# Patient Record
Sex: Female | Born: 1950
Health system: Southern US, Community
[De-identification: ages and names within clinical notes are randomized; demographics above are authoritative.]

## PROBLEM LIST (undated history)

## (undated) DIAGNOSIS — E079 Disorder of thyroid, unspecified: Secondary | ICD-10-CM

## (undated) DIAGNOSIS — K219 Gastro-esophageal reflux disease without esophagitis: Secondary | ICD-10-CM

## (undated) DIAGNOSIS — U071 COVID-19: Secondary | ICD-10-CM

## (undated) DIAGNOSIS — E039 Hypothyroidism, unspecified: Secondary | ICD-10-CM

## (undated) DIAGNOSIS — K5792 Diverticulitis of intestine, part unspecified, without perforation or abscess without bleeding: Secondary | ICD-10-CM

## (undated) HISTORY — DX: Gastro-esophageal reflux disease without esophagitis: K21.9

## (undated) HISTORY — PX: COLONOSCOPY W/ POLYPECTOMY: SHX1380

## (undated) HISTORY — PX: ABDOMINAL HYSTERECTOMY: SHX81

## (undated) HISTORY — DX: Disorder of thyroid, unspecified: E07.9

## (undated) HISTORY — DX: Diverticulitis of intestine, part unspecified, without perforation or abscess without bleeding: K57.92

---

## 2004-04-01 ENCOUNTER — Ambulatory Visit (HOSPITAL_COMMUNITY): Admission: RE | Admit: 2004-04-01 | Discharge: 2004-04-01 | Payer: Self-pay | Admitting: Gastroenterology

## 2004-04-01 ENCOUNTER — Encounter (INDEPENDENT_AMBULATORY_CARE_PROVIDER_SITE_OTHER): Payer: Self-pay | Admitting: Specialist

## 2006-01-26 ENCOUNTER — Other Ambulatory Visit: Admission: RE | Admit: 2006-01-26 | Discharge: 2006-01-26 | Payer: Self-pay | Admitting: Family Medicine

## 2010-07-07 ENCOUNTER — Inpatient Hospital Stay (HOSPITAL_COMMUNITY): Admission: EM | Admit: 2010-07-07 | Discharge: 2010-07-12 | Payer: Self-pay | Source: Home / Self Care

## 2010-07-23 ENCOUNTER — Ambulatory Visit (HOSPITAL_COMMUNITY)
Admission: RE | Admit: 2010-07-23 | Discharge: 2010-07-23 | Payer: Self-pay | Source: Home / Self Care | Attending: Surgery | Admitting: Surgery

## 2010-10-13 LAB — CBC
HCT: 38.4 % (ref 36.0–46.0)
HCT: 40.8 % (ref 36.0–46.0)
Hemoglobin: 13 g/dL (ref 12.0–15.0)
Hemoglobin: 14 g/dL (ref 12.0–15.0)
MCH: 31.5 pg (ref 26.0–34.0)
MCHC: 33.9 g/dL (ref 30.0–36.0)
MCHC: 34.3 g/dL (ref 30.0–36.0)
MCV: 91.3 fL (ref 78.0–100.0)
Platelets: 142 10*3/uL — ABNORMAL LOW (ref 150–400)
RDW: 12 % (ref 11.5–15.5)

## 2010-10-13 LAB — DIFFERENTIAL
Basophils Absolute: 0 10*3/uL (ref 0.0–0.1)
Lymphocytes Relative: 23 % (ref 12–46)
Lymphocytes Relative: 33 % (ref 12–46)
Lymphs Abs: 1.3 10*3/uL (ref 0.7–4.0)
Lymphs Abs: 2.5 10*3/uL (ref 0.7–4.0)
Monocytes Absolute: 0.3 10*3/uL (ref 0.1–1.0)
Monocytes Absolute: 0.8 10*3/uL (ref 0.1–1.0)
Monocytes Relative: 7 % (ref 3–12)
Monocytes Relative: 9 % (ref 3–12)
Neutro Abs: 2.1 10*3/uL (ref 1.7–7.7)
Neutro Abs: 7.5 10*3/uL (ref 1.7–7.7)

## 2010-10-13 LAB — BASIC METABOLIC PANEL
CO2: 26 mEq/L (ref 19–32)
GFR calc non Af Amer: >60 mL/min (ref >60)
Glucose, Bld: 106 mg/dL — ABNORMAL HIGH (ref 70–99)
Potassium: 4.1 mEq/L (ref 3.5–5.1)
Sodium: 139 mEq/L (ref 135–145)

## 2010-10-13 LAB — COMPREHENSIVE METABOLIC PANEL
Albumin: 3.3 g/dL — ABNORMAL LOW (ref 3.5–5.2)
BUN: 6 mg/dL (ref 6–23)
Calcium: 8.8 mg/dL (ref 8.4–10.5)
Creatinine, Ser: 0.7 mg/dL (ref 0.4–1.2)
Total Protein: 6.5 g/dL (ref 6.0–8.3)

## 2010-10-13 LAB — URINALYSIS, ROUTINE W REFLEX MICROSCOPIC
Hgb urine dipstick: NEGATIVE
Nitrite: NEGATIVE
Protein, ur: NEGATIVE mg/dL
Urobilinogen, UA: 1 mg/dL (ref 0.0–1.0)

## 2010-10-13 LAB — PHOSPHORUS: Phosphorus: 2.8 mg/dL (ref 2.3–4.6)
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
## 2010-12-18 NOTE — Op Note (Signed)
NAME:  VERBA, AINLEY                       ACCOUNT NO.:  [id number]   MEDICAL RECORD NO.:  [id number]                   PATIENT TYPE:  AMB   LOCATION:  ENDO                                 FACILITY:  [facility]   PHYSICIAN:  Danise Edge, M.D.                DATE OF BIRTH:  [birth year]   DATE OF PROCEDURE:  04/01/2004  DATE OF DISCHARGE:                                 OPERATIVE REPORT   PROCEDURE:  Screening colonoscopy.   PROCEDURE INDICATION:  Ms. Kaylina Cahue is a 60 year old female, born  1951/07/20.  Ms. Patchell is scheduled to undergo her first screening  colonoscopy with polypectomy to prevent colon cancer.  Her mother required  surgery to remove colon polyps.  She died postoperatively of disseminated  intravascular coagulation.   ENDOSCOPIST:  Danise Edge, M.D.   PREMEDICATION:  1.  Versed 6 mg.  2.  Demerol 50 mg.   DESCRIPTION OF PROCEDURE:  After obtaining informed consent, Ms. Welcher was  placed in the left lateral decubitus position.  I administered intravenous  Demerol and intravenous Versed to achieve conscious sedation for the  procedure.  The patient's blood pressure, oxygen saturation, and cardiac  rhythm were monitored throughout the procedure and documented in the medical  record.   Anal inspection and digital rectal exam were normal.  The Olympus adjustable  pediatric colonoscope was introduced into the rectum and advanced to the  cecum.  Colonic preparation for the exam today was excellent.   RECTUM:  Normal.  SIGMOID COLON AND DESCENDING COLON:  At 20 cm from the anal verge, a 2 mm  sessile polyp was removed with the electrocautery snare.  There are a few  shallow diverticula noted in the left colon.  SPLENIC FLEXURE:  Normal.  TRANSVERSE COLON:  Normal.  HEPATIC FLEXURE:  Normal.  ASCENDING COLON:  Normal.  CECUM AND ILEOCECAL VALVE:  Normal.   ASSESSMENT:  1.  A small polyp was removed from the distal sigmoid colon.  2.  Shallow  diverticula are present in the left colon.                                               Danise Edge, M.D.    MJ/MEDQ  D:  04/01/2004  T:  04/01/2004  Job:  [id number]   cc:   Donia Guiles, M.D.  [address] [place]  [place] [address]  Fax: [phone]

## 2012-02-14 ENCOUNTER — Ambulatory Visit
Admission: RE | Admit: 2012-02-14 | Discharge: 2012-02-14 | Disposition: A | Discharge status: Home / Self Care | Payer: Managed Care, Other (non HMO) | Source: Ambulatory Visit | Attending: Family Medicine | Admitting: Family Medicine

## 2012-02-14 ENCOUNTER — Other Ambulatory Visit: Payer: Self-pay | Admitting: Family Medicine

## 2012-02-14 DIAGNOSIS — R52 Pain, unspecified: Secondary | ICD-10-CM

## 2012-02-29 ENCOUNTER — Other Ambulatory Visit: Payer: Self-pay | Admitting: Family Medicine

## 2012-02-29 ENCOUNTER — Ambulatory Visit
Admission: RE | Admit: 2012-02-29 | Discharge: 2012-02-29 | Disposition: A | Discharge status: Home / Self Care | Payer: Managed Care, Other (non HMO) | Source: Ambulatory Visit | Attending: Family Medicine | Admitting: Family Medicine

## 2012-02-29 DIAGNOSIS — M7989 Other specified soft tissue disorders: Secondary | ICD-10-CM

## 2012-02-29 MED ORDER — GADOBENATE DIMEGLUMINE 529 MG/ML IV SOLN
7.0000 mL | Freq: Once | INTRAVENOUS | Status: AC | PRN
  Administered 2012-02-29: 7 mL via INTRAVENOUS

## 2013-05-02 ENCOUNTER — Ambulatory Visit
Admission: RE | Admit: 2013-05-02 | Discharge: 2013-05-02 | Disposition: A | Discharge status: Home / Self Care | Payer: 59 | Source: Ambulatory Visit | Attending: Family Medicine | Admitting: Family Medicine

## 2013-05-02 ENCOUNTER — Other Ambulatory Visit: Payer: Self-pay | Admitting: Family Medicine

## 2013-05-02 DIAGNOSIS — R109 Unspecified abdominal pain: Secondary | ICD-10-CM

## 2013-05-02 IMAGING — CT CT ABD-PELV W/ CM
2 of 5 series · 17 of 46 positions shown, 19 images · IV contrast (30CC OMNI 300 & [ID] OMNI 300)
Comparison: [DATE]

CLINICAL DATA: Lower abdominal pain for 4 days, nausea, initial
encounter, past history of bowel perforation

EXAM:
CT ABDOMEN AND PELVIS WITH CONTRAST
TECHNIQUE: Multidetector CT imaging of the abdomen and pelvis was performed
using the standard protocol following bolus administration of
intravenous contrast. Sagittal and coronal MPR images reconstructed
from axial data set.
BUN and creatinine were obtained on site at [HOSPITAL] at
[HOSPITAL].
Results:  BUN 10 mg/dL,  Creatinine 0.6 mg/dL.
CONTRAST:  100mL OMNIPAQUE IOHEXOL 300 MG/ML SOLN Dilute oral
contrast.

[Series 2: abd/pelvis with · axial · 0.82mm/px · z∈[-444,-50]mm · 14 of 89 slices shown, 16 images]
[im 5/89  soft-tissue]
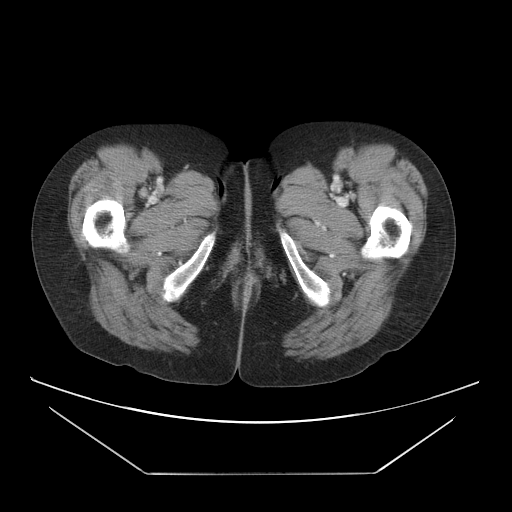
[im 5/89  bone]
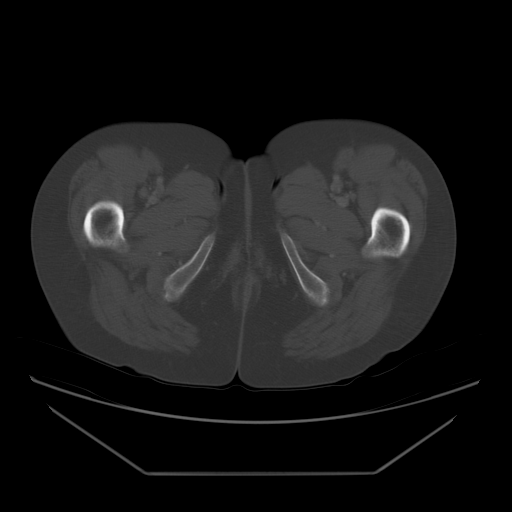
[im 10/89  soft-tissue]
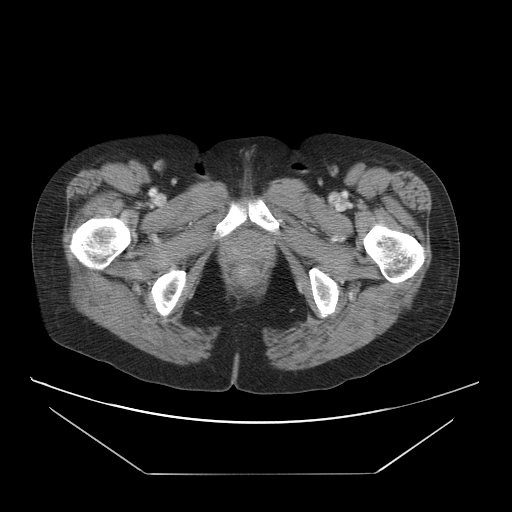
[im 19/89  soft-tissue]
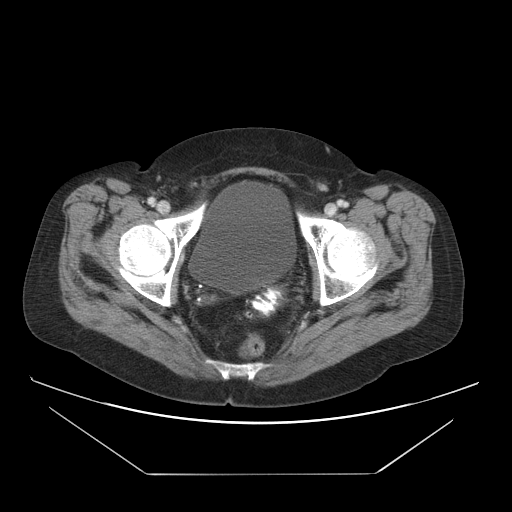
[im 24/89  soft-tissue]
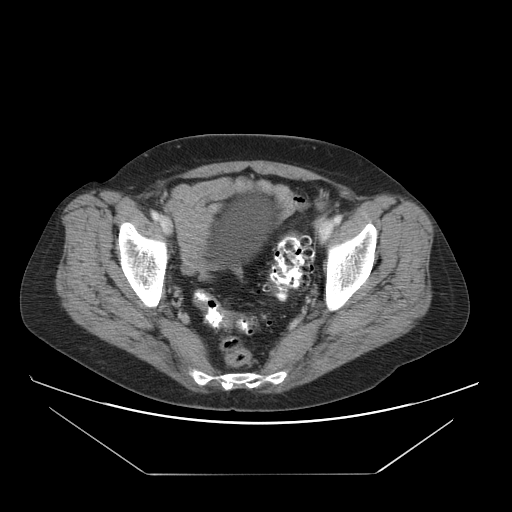
[im 28/89  soft-tissue]
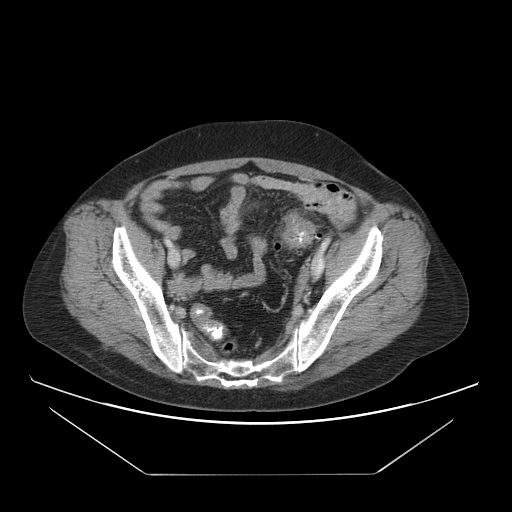
[im 38/89  soft-tissue]
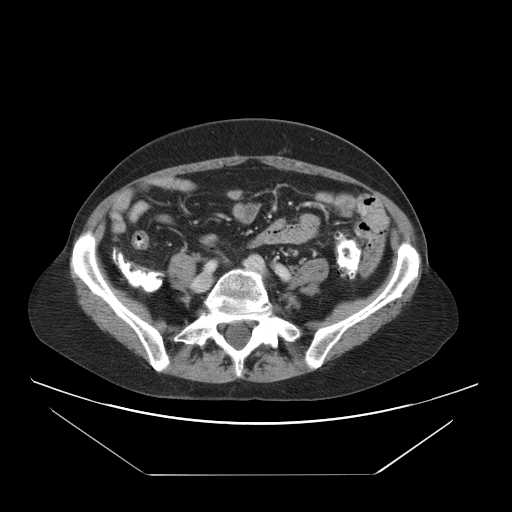
[im 42/89  soft-tissue]
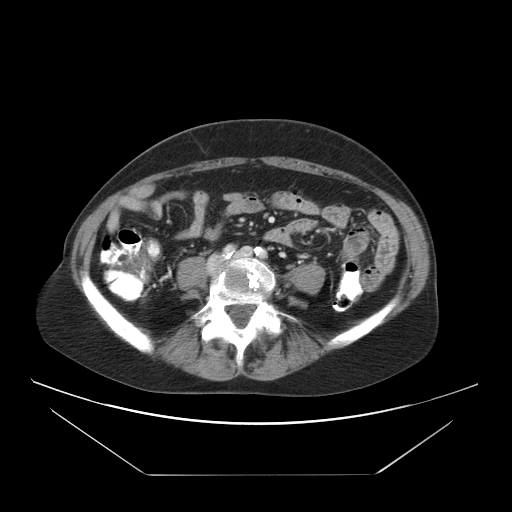
[im 47/89  soft-tissue]
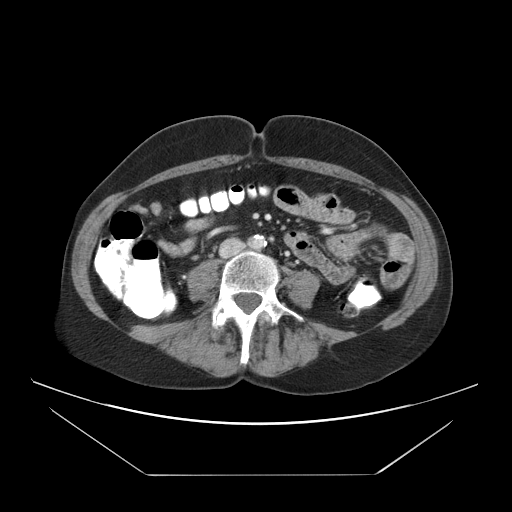
[im 51/89  soft-tissue]
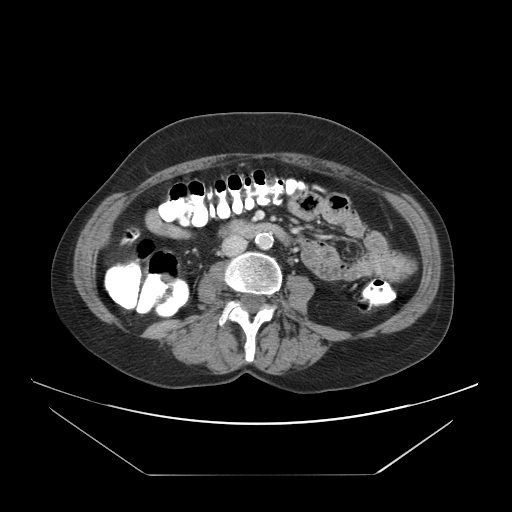
[im 51/89  bone]
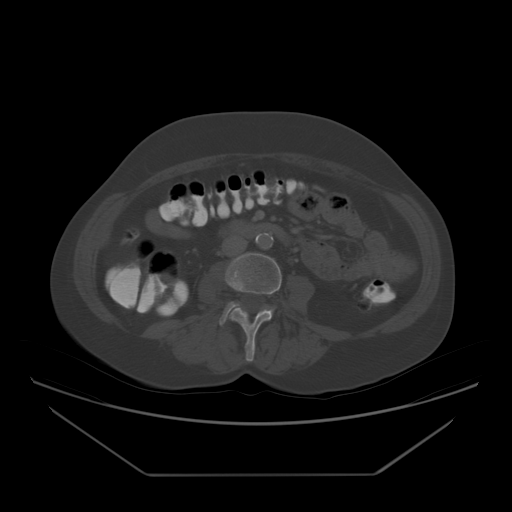
[im 61/89  soft-tissue]
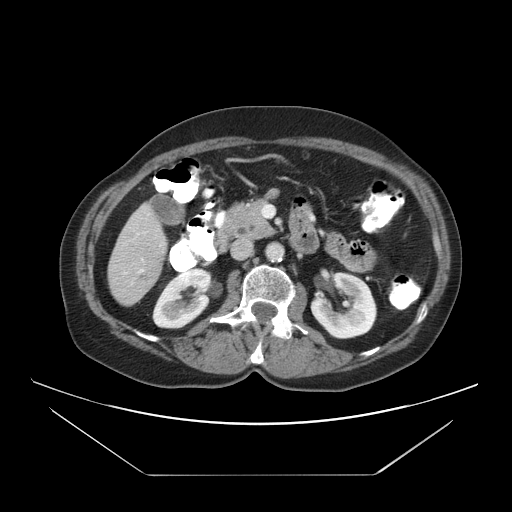
[im 65/89  soft-tissue]
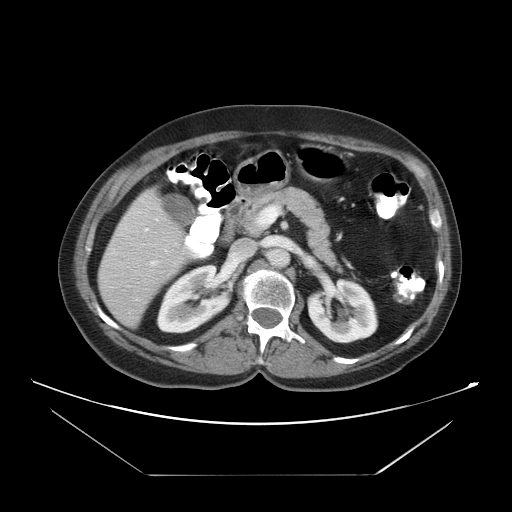
[im 70/89  soft-tissue]
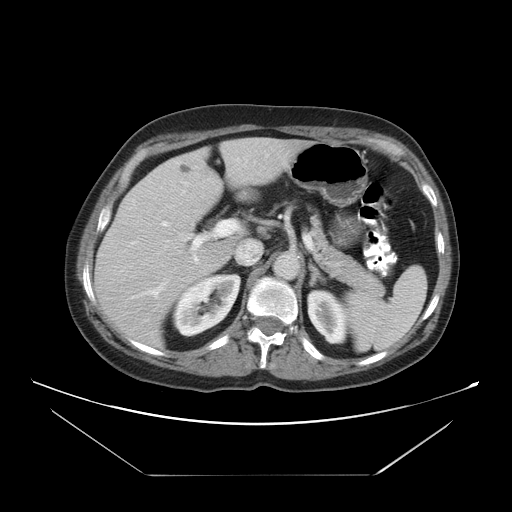
[im 79/89  soft-tissue]
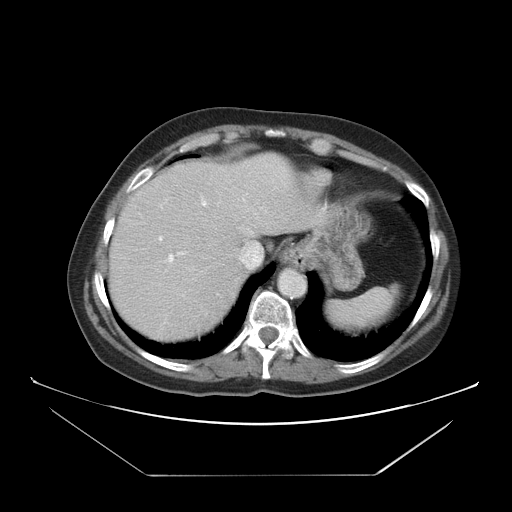
[im 84/89  soft-tissue]
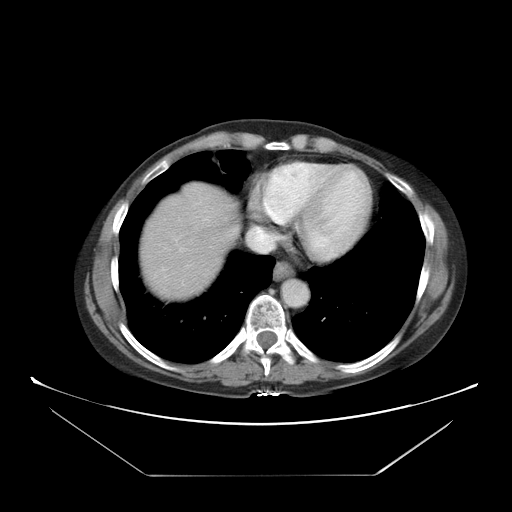

[Series 401: coronal · coronal · 0.88mm/px · 3 of 105 slices shown]
[im 35/105  soft-tissue]
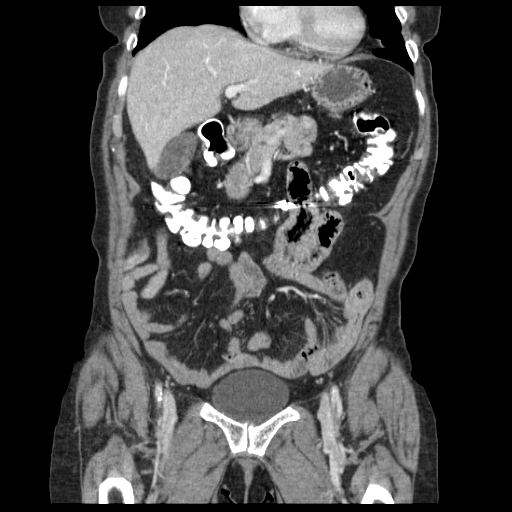
[im 47/105  soft-tissue]
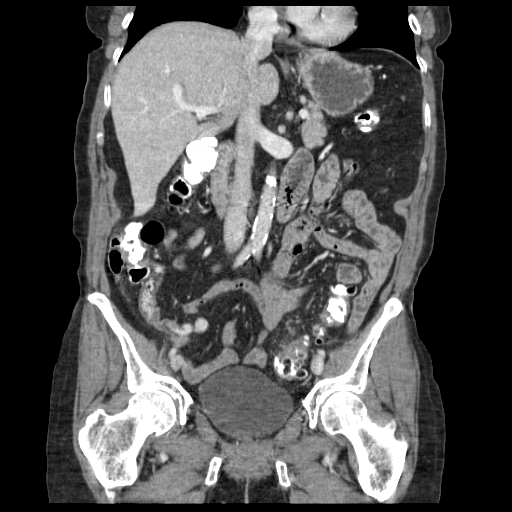
[im 58/105  soft-tissue]
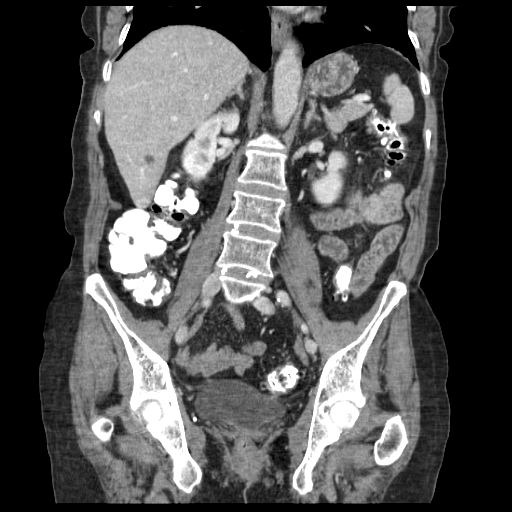

[17 of 46 positions shown; findings below may reference images not displayed]

FINDINGS: Minimal dependent atelectasis at lung bases.

Small hepatic cysts.

Liver, spleen, pancreas, kidneys, and adrenal glands normal
appearance.

Again noted along the diverticulum at pancreatic head.

Small umbilical hernia containing fat.

Normal appendix.

Diverticulosis of the descending and sigmoid colon with focal wall
thickening and pericolic inflammation at the proximal sigmoid colon
compatible with acute diverticulitis.

No evidence of abscess or perforation.

Beam hardening artifacts secondary to a 6 mm diameter metallic
foreign body in the mid abdomen.

Bladder and ureters unremarkable.

Scattered atherosclerotic calcifications.

Uterus surgically absent with unremarkable ovaries.

No mass, adenopathy, free fluid or free air.

No hernia or acute bone lesion.
IMPRESSION: Acute sigmoid diverticulitis without evidence of perforation or
abscess.

Small umbilical hernia containing fat.

Nonspecific metallic foreign body in mid abdomen 6 mm diameter.

Small hepatic cysts.

## 2013-05-02 MED ORDER — IOHEXOL 300 MG/ML  SOLN
100.0000 mL | Freq: Once | INTRAMUSCULAR | Status: AC | PRN
  Administered 2013-05-02: 100 mL via INTRAVENOUS

## 2013-06-19 ENCOUNTER — Other Ambulatory Visit: Payer: Self-pay | Admitting: Family Medicine

## 2013-06-19 DIAGNOSIS — K5792 Diverticulitis of intestine, part unspecified, without perforation or abscess without bleeding: Secondary | ICD-10-CM

## 2013-06-19 DIAGNOSIS — R109 Unspecified abdominal pain: Secondary | ICD-10-CM

## 2013-06-20 ENCOUNTER — Ambulatory Visit
Admission: RE | Admit: 2013-06-20 | Discharge: 2013-06-20 | Disposition: A | Discharge status: Home / Self Care | Payer: 59 | Source: Ambulatory Visit | Attending: Family Medicine | Admitting: Family Medicine

## 2013-06-20 DIAGNOSIS — R109 Unspecified abdominal pain: Secondary | ICD-10-CM

## 2013-06-20 DIAGNOSIS — K5792 Diverticulitis of intestine, part unspecified, without perforation or abscess without bleeding: Secondary | ICD-10-CM

## 2013-06-20 MED ORDER — IOHEXOL 300 MG/ML  SOLN
100.0000 mL | Freq: Once | INTRAMUSCULAR | Status: AC | PRN
  Administered 2013-06-20: 100 mL via INTRAVENOUS

## 2013-11-15 ENCOUNTER — Ambulatory Visit (INDEPENDENT_AMBULATORY_CARE_PROVIDER_SITE_OTHER): Payer: 59

## 2013-11-15 ENCOUNTER — Other Ambulatory Visit: Payer: Self-pay

## 2013-11-15 VITALS — BP 114/84 | HR 92 | Resp 18

## 2013-11-15 DIAGNOSIS — M67451 Ganglion, right hip: Secondary | ICD-10-CM

## 2013-11-15 DIAGNOSIS — S9030XA Contusion of unspecified foot, initial encounter: Secondary | ICD-10-CM

## 2013-11-15 DIAGNOSIS — M6748 Ganglion, other site: Secondary | ICD-10-CM

## 2013-11-15 DIAGNOSIS — R52 Pain, unspecified: Secondary | ICD-10-CM

## 2013-11-15 DIAGNOSIS — M6740 Ganglion, unspecified site: Secondary | ICD-10-CM

## 2013-11-15 DIAGNOSIS — M7660 Achilles tendinitis, unspecified leg: Secondary | ICD-10-CM

## 2013-11-15 NOTE — Patient Instructions (Signed)
ICE INSTRUCTIONS  Apply ice or cold pack to the affected area at least 3 times a day for 10-15 minutes each time.  You should also use ice after prolonged activity or vigorous exercise.  Do not apply ice longer than 20 minutes at one time.  Always keep a cloth between your skin and the ice pack to prevent burns.  Being consistent and following these instructions will help control your symptoms.  We suggest you purchase a gel ice pack because they are reusable and do bit leak.  Some of them are designed to wrap around the area.  Use the method that works best for you.  Here are some other suggestions for icing.   Use a frozen bag of peas or corn-inexpensive and molds well to your body, usually stays frozen for 10 to 20 minutes.  Wet a towel with cold water and squeeze out the excess until it's damp.  Place in a bag in the freezer for 20 minutes. Then remove and use.  Maintain ice pack 3 or 4 times a day also maintain compression of the foot at all times for the next 2 weeks

## 2013-11-15 NOTE — Progress Notes (Signed)
   Subjective:    Patient ID: Julie Arias, female    DOB: 1950/10/02, 63 y.o.   MRN: [id number]  HPI my right foot has been bothering me since Saturday 11/10/13 and throbbing and swollen and hurts to walk on it and I was planting flowers and I was bend down and then when I came back up and I heard a pop and hurts with shoes and a callus on my 5th left little toe and tried callus remover and tried the pads as well    Review of Systems  Constitutional: Positive for fatigue.  All other systems reviewed and are negative.      Objective:   Physical Exam Neurovascular status is intact with pedal pulses palpable DP postal for PT posterior were for capillary fill time 3 seconds all digits skin temperature warm turgor normal no edema rubor pallor or varicosities noted on left right foot is edema over the mid tarsus second third metatarsal area of the right foot. There is history of injury patient heard a popping sensation when she was kneeling down and got up several days ago since that time there is a large swollen painful area over the dorsal foot distal to the Lisfranc joint. Neurologically epicritic and proprioceptive sensations intact there is pain on palpation of percussion over this area x-rays demonstrate rectus foot type no signs of obvious fracture or osseous abnormality or identified mild inferior calcaneal spurring is noted. Neurologically skin color pigment normal hair growth absent nails criptotic no ecchymosis no laceration or noted no secondary infection noted patient also some pinch callus in the fifth digit due to unrelenting fifth toe of left foot may address this at some time in the future would be candidate for tube foam padding cushioning I silicone Schillinger padding or possibly the hammertoe repair at some point in the future.       Assessment & Plan:  Assessment this time suspect contusion with secondary ganglion cyst or the dorsum of the foot it is in a coming from this  subtalar and mid tarsus joint/Lisfranc joint or the extensor tendon synovium. At this time local anesthetic block administered total of 3 cc 50-50 mixture of 2% Xylocaine plain and 0.5% Marcaine plain to the dorsum of the right foot Betadine prep performed at this time a 20-gauge needle is introduced and approximately 2 cc of a gelatinous bloody drainage is identified expressed from the site to CC samples submitted for pathology analysis in the heparin container Vacutainer. Patient will followup with in 2 weeks for reevaluation after the aspiration site was then infiltrated with 0.5 cc or of Kenalog 10 and 0.5 cc of Marcaine plain. A compression dressing was applied and ankle dispensed to maintain compression for the next several weeks. Reappointed 2 weeks for followup and reevaluation patient is tramadol for pain released plain Tylenol if needed  Harriet Masson DPM

## 2013-11-19 ENCOUNTER — Telehealth: Payer: Self-pay | Admitting: *Deleted

## 2013-11-19 LAB — SYNOVIAL FLUID PANEL: CRYSTALS FLUID: NONE SEEN

## 2013-11-19 NOTE — Telephone Encounter (Signed)
We were not able to do synovial fluid test on the sample because it was too thick and cloudy.  We were able to do the culture.  I told her I would let Dr. Blenda Mounts know.

## 2013-11-20 ENCOUNTER — Ambulatory Visit: Payer: Self-pay | Admitting: Podiatrist

## 2013-11-21 LAB — BODY FLUID CULTURE
GRAM STAIN: NONE SEEN
GRAM STAIN: NONE SEEN
Organism ID, Bacteria: NO GROWTH

## 2013-11-28 ENCOUNTER — Ambulatory Visit (INDEPENDENT_AMBULATORY_CARE_PROVIDER_SITE_OTHER): Payer: 59

## 2013-11-28 VITALS — BP 78/48 | HR 70 | Resp 18

## 2013-11-28 DIAGNOSIS — S9030XA Contusion of unspecified foot, initial encounter: Secondary | ICD-10-CM

## 2013-11-28 DIAGNOSIS — M6740 Ganglion, unspecified site: Secondary | ICD-10-CM

## 2013-11-28 NOTE — Patient Instructions (Signed)
ICE INSTRUCTIONS  Apply ice or cold pack to the affected area at least 3 times a day for 10-15 minutes each time.  You should also use ice after prolonged activity or vigorous exercise.  Do not apply ice longer than 20 minutes at one time.  Always keep a cloth between your skin and the ice pack to prevent burns.  Being consistent and following these instructions will help control your symptoms.  We suggest you purchase a gel ice pack because they are reusable and do bit leak.  Some of them are designed to wrap around the area.  Use the method that works best for you.  Here are some other suggestions for icing.   Use a frozen bag of peas or corn-inexpensive and molds well to your body, usually stays frozen for 10 to 20 minutes.  Wet a towel with cold water and squeeze out the excess until it's damp.  Place in a bag in the freezer for 20 minutes. Then remove and use.  Also maintain compression wrap at all times especially during the day utilizing the anklet or Coflex wrap  If not resolved in one month or but anytime he gets bigger more painful or exacerbates followup as needed

## 2013-11-28 NOTE — Progress Notes (Signed)
   Subjective:    Patient ID: Julie Arias, female    DOB: January 20, 1951, 63 y.o.   MRN: [id number]  HPI I am doing much better on top of my right foot    Review of Systems no systemic changes or findings noted     Objective:   Physical Exam Neurovascular status is intact pedal pulses are palpable no cyst of the dorsal second metatarsal area of the right foot is going down patient wearing compression stockings for the most part at this time some Coflex wrap was applied she is washing stocking currently. Pathology report to indicate no sign of infection no crystals this is most likely consistent with ganglion cyst and will monitor at this point if resolving over no recurrence no additional treatment needed however if there's additional swelling or recurrence of the next month or any time followup in the future as needed       Assessment & Plan:  Assessment good progress following aspiration of suspected ganglion cyst dorsum right foot minimal edema still present mild ecchymosis of this doesn't following the aspiration and local block patient is maintain compression wrap as instructed utilizing anklet or Coflex wrap in followup as needed if no improvement within a month  Harriet Masson DPM

## 2014-04-24 ENCOUNTER — Ambulatory Visit (INDEPENDENT_AMBULATORY_CARE_PROVIDER_SITE_OTHER): Payer: 59

## 2014-04-24 VITALS — BP 141/87 | HR 81 | Resp 18

## 2014-04-24 DIAGNOSIS — M6740 Ganglion, unspecified site: Secondary | ICD-10-CM

## 2014-04-24 DIAGNOSIS — R52 Pain, unspecified: Secondary | ICD-10-CM

## 2014-04-24 NOTE — Patient Instructions (Signed)
Pre-Operative Instructions  Congratulations, you have decided to take an important step to improving your quality of life.  You can be assured that the doctors of [facility] will be with you every step of the way.  1. Plan to be at the surgery center/hospital at least 1 (one) hour prior to your scheduled time unless otherwise directed by the surgical center/hospital staff.  You must have a responsible adult accompany you, remain during the surgery and drive you home.  Make sure you have directions to the surgical center/hospital and know how to get there on time. 2. For hospital based surgery you will need to obtain a history and physical form from your family physician within 1 month prior to the date of surgery- we will give you a form for you primary physician.  3. We make every effort to accommodate the date you request for surgery.  There are however, times where surgery dates or times have to be moved.  We will contact you as soon as possible if a change in schedule is required.   4. No Aspirin/Ibuprofen for one week before surgery.  If you are on aspirin, any non-steroidal anti-inflammatory medications (Mobic, Aleve, Ibuprofen) you should stop taking it 7 days prior to your surgery.  You make take Tylenol  For pain prior to surgery.  5. Medications- If you are taking daily heart and blood pressure medications, seizure, reflux, allergy, asthma, anxiety, pain or diabetes medications, make sure the surgery center/hospital is aware before the day of surgery so they may notify you which medications to take or avoid the day of surgery. 6. No food or drink after midnight the night before surgery unless directed otherwise by surgical center/hospital staff. 7. No alcoholic beverages 24 hours prior to surgery.  No smoking 24 hours prior to or 24 hours after surgery. 8. Wear loose pants or shorts- loose enough to fit over bandages, boots, and casts. 9. No slip on shoes, sneakers are best. 10. Bring  your boot with you to the surgery center/hospital.  Also bring crutches or a walker if your physician has prescribed it for you.  If you do not have this equipment, it will be provided for you after surgery. 11. If you have not been contracted by the surgery center/hospital by the day before your surgery, call to confirm the date and time of your surgery. 12. Leave-time from work may vary depending on the type of surgery you have.  Appropriate arrangements should be made prior to surgery with your employer. 13. Prescriptions will be provided immediately following surgery by your doctor.  Have these filled as soon as possible after surgery and take the medication as directed. 14. Remove nail polish on the operative foot. 15. Wash the night before surgery.  The night before surgery wash the foot and leg well with the antibacterial soap provided and water paying special attention to beneath the toenails and in between the toes.  Rinse thoroughly with water and dry well with a towel.  Perform this wash unless told not to do so by your physician.  Enclosed: 1 Ice pack (please put in freezer the night before surgery)   1 Hibiclens skin cleaner   Pre-op Instructions  If you have any questions regarding the instructions, do not hesitate to call our office.  [place]: [address] [place], [place] [address] [phone]  [place]: [address] [place], [place] [address] [phone]  [place]: [address]  [place], [place] [address] [phone]  Dr. Earnestene Angello   Tuchman DPM, Dr. Norman Regal DPM Dr. Mickey Hebel DPM, Dr. M. Todd Hyatt DPM, Dr. Kathryn Egerton DPM 

## 2014-04-24 NOTE — Progress Notes (Signed)
   Subjective:    Patient ID: Julie Arias, female    DOB: [birth year], 63 y.o.   MRN: [id number]  HPI we were in [place] on vacation and i noticed on Wednesday that this place has came up and started out as a little knot and now it has gotten bigger and red and feels like there is something in it on my right foot    Review of Systems no new findings or systemic changes noted since last visit April     Objective:   Physical Exam 63 year old female well-developed well-nourished or and chemistry presents this time with recurrence of soft tissue Mass lesion dorsum right foot over the second medullary a. It is been gone ahead and aspirated back in April of this year have a small lump that was not pain for symptomatic however it has now enlarged in the last couple of weeks painful tender area greater than inch in diameter from medial to lateral in the proximal distal. Sore line the second metatarsal shaft is somewhat movable early firm although not hard as bone. There is tenderness on palpation the area draped lateral compression no increased temperature no ascending psoas lymphangitis no open wounds or ulcers no history of injury or new contusion noted remainder of exam unremarkable rectus foot mild flexible digital contractures noted nail skin texture and turgor normal no other abnormalities identified again neurovascular status intact pedal pulses palpable DP postal for PT +2/4 bilateral capillary refill time 3 seconds all digits epicritic and proprioceptive sensations intact and symmetric. Normal plantar response and DTRs noted       Assessment & Plan:  Assessment is recurrence recalcitrant ganglion cyst dorsum right foot overlying the second metatarsal area at this time based on this a recurrence option for additional aspiration was offered however based on the fact that it will were for short time and recurred recommendation at this time is also for surgical excision patient appears to be  amenable to that understands she will be out of work for a week or 2 but shouldn't return to work light duty within a week or 2 with her surgical shoe in place. There is no bone surgery shoe she should be able be weightbearing without difficulties at this time consent form for excision assessment ganglion cyst dorsum right foot is reviewed and signed all questions asked medication are answered there no contraindications and surgery was scheduled her convenience at the [facility] specialty surgical center. Patient will be in a surgical shoe for approximately 3-4 weeks weightbearing postoperatively although moderate activity for the first to 4 weeks as instructed surgery scheduled all patient's questions were answered at this time  Harriet Masson DPM

## 2014-04-25 ENCOUNTER — Ambulatory Visit: Payer: 59

## 2014-05-02 ENCOUNTER — Telehealth: Payer: Self-pay | Admitting: *Deleted

## 2014-05-03 NOTE — Telephone Encounter (Signed)
Called patient and told her that we have canceled the surgery and that Dr Blenda Mounts states unless it starts to swell becomes red, hot or painful that she does not need to come in   [place]

## 2014-10-23 ENCOUNTER — Other Ambulatory Visit: Payer: Self-pay | Admitting: Gastroenterology

## 2015-01-03 ENCOUNTER — Encounter (HOSPITAL_COMMUNITY): Payer: Self-pay | Admitting: *Deleted

## 2015-04-04 ENCOUNTER — Encounter (HOSPITAL_COMMUNITY): Payer: Self-pay | Admitting: *Deleted

## 2015-04-16 ENCOUNTER — Other Ambulatory Visit: Payer: Self-pay | Admitting: Gastroenterology

## 2015-04-21 ENCOUNTER — Ambulatory Visit (HOSPITAL_COMMUNITY)
Admission: RE | Admit: 2015-04-21 | Payer: Managed Care, Other (non HMO) | Source: Ambulatory Visit | Admitting: Gastroenterology

## 2015-04-21 HISTORY — DX: Hypothyroidism, unspecified: E03.9

## 2015-04-21 SURGERY — COLONOSCOPY WITH PROPOFOL
Anesthesia: Monitor Anesthesia Care

## 2015-06-24 ENCOUNTER — Encounter (HOSPITAL_COMMUNITY): Payer: Self-pay | Admitting: *Deleted

## 2015-07-07 ENCOUNTER — Ambulatory Visit (HOSPITAL_COMMUNITY): Payer: 59 | Admitting: Certified Registered Nurse Anesthetist

## 2015-07-07 ENCOUNTER — Ambulatory Visit (HOSPITAL_COMMUNITY)
Admission: RE | Admit: 2015-07-07 | Discharge: 2015-07-07 | Disposition: A | Discharge status: Home / Self Care | Payer: 59 | Source: Ambulatory Visit | Attending: Gastroenterology | Admitting: Gastroenterology

## 2015-07-07 ENCOUNTER — Encounter (HOSPITAL_COMMUNITY)
Admission: RE | Disposition: A | Discharge status: Home / Self Care | Payer: Self-pay | Source: Ambulatory Visit | Attending: Gastroenterology

## 2015-07-07 ENCOUNTER — Encounter (HOSPITAL_COMMUNITY): Payer: Self-pay

## 2015-07-07 DIAGNOSIS — Z1211 Encounter for screening for malignant neoplasm of colon: Secondary | ICD-10-CM | POA: Diagnosis present

## 2015-07-07 DIAGNOSIS — E039 Hypothyroidism, unspecified: Secondary | ICD-10-CM | POA: Diagnosis not present

## 2015-07-07 DIAGNOSIS — Z90710 Acquired absence of both cervix and uterus: Secondary | ICD-10-CM | POA: Diagnosis not present

## 2015-07-07 DIAGNOSIS — Z79899 Other long term (current) drug therapy: Secondary | ICD-10-CM | POA: Diagnosis not present

## 2015-07-07 DIAGNOSIS — I10 Essential (primary) hypertension: Secondary | ICD-10-CM | POA: Insufficient documentation

## 2015-07-07 DIAGNOSIS — E559 Vitamin D deficiency, unspecified: Secondary | ICD-10-CM | POA: Insufficient documentation

## 2015-07-07 DIAGNOSIS — K5730 Diverticulosis of large intestine without perforation or abscess without bleeding: Secondary | ICD-10-CM | POA: Diagnosis not present

## 2015-07-07 DIAGNOSIS — F419 Anxiety disorder, unspecified: Secondary | ICD-10-CM | POA: Insufficient documentation

## 2015-07-07 HISTORY — PX: COLONOSCOPY WITH PROPOFOL: SHX5780

## 2015-07-07 SURGERY — COLONOSCOPY WITH PROPOFOL
Anesthesia: Monitor Anesthesia Care

## 2015-07-07 MED ORDER — GLYCOPYRROLATE 0.2 MG/ML IJ SOLN
INTRAMUSCULAR | Status: DC | PRN
  Administered 2015-07-07 (×2): 0.1 mg via INTRAVENOUS

## 2015-07-07 MED ORDER — PROPOFOL 10 MG/ML IV BOLUS
INTRAVENOUS | Status: DC | PRN
  Administered 2015-07-07: 30 mg via INTRAVENOUS
  Administered 2015-07-07 (×2): 10 mg via INTRAVENOUS
  Administered 2015-07-07: 40 mg via INTRAVENOUS
  Administered 2015-07-07: 30 mg via INTRAVENOUS
  Administered 2015-07-07: 20 mg via INTRAVENOUS
  Administered 2015-07-07: 10 mg via INTRAVENOUS
  Administered 2015-07-07: 30 mg via INTRAVENOUS
  Administered 2015-07-07: 20 mg via INTRAVENOUS
  Administered 2015-07-07: 30 mg via INTRAVENOUS

## 2015-07-07 MED ORDER — GLYCOPYRROLATE 0.2 MG/ML IJ SOLN
INTRAMUSCULAR | Status: AC
  Filled 2015-07-07: qty 1

## 2015-07-07 MED ORDER — LACTATED RINGERS IV SOLN
INTRAVENOUS | Status: DC
Start: 2015-07-07 — End: 2015-07-07
  Administered 2015-07-07: 10:00:00 via INTRAVENOUS

## 2015-07-07 MED ORDER — LIDOCAINE HCL (CARDIAC) 20 MG/ML IV SOLN
INTRAVENOUS | Status: AC
  Filled 2015-07-07: qty 5

## 2015-07-07 MED ORDER — SODIUM CHLORIDE 0.9 % IV SOLN: INTRAVENOUS | Status: DC

## 2015-07-07 MED ORDER — PROPOFOL 10 MG/ML IV BOLUS
INTRAVENOUS | Status: AC
Start: 2015-07-07 — End: 2015-07-07
  Filled 2015-07-07: qty 40

## 2015-07-07 MED ORDER — LIDOCAINE HCL (CARDIAC) 20 MG/ML IV SOLN
INTRAVENOUS | Status: DC | PRN
  Administered 2015-07-07: 50 mg via INTRAVENOUS

## 2015-07-07 SURGICAL SUPPLY — 22 items

## 2015-07-07 NOTE — H&P (Signed)
  Procedure: Screening colonoscopy. 04/01/2004 normal screening colonoscopy performed. History of perforated colonic diverticulitis treated medically  History: The patient is a 64 year old female born 05/11/1951. She is scheduled to undergo a repeat screening colonoscopy today.  Past medical history: Hypothyroidism post radioactive iodine of the thyroid. Chronic anxiety. Hypertension. Vitamin D deficiency. Hysterectomy performed for endometriosis in 1978.  Exam: The patient is alert and lying comfortably on the endoscopy stretcher. Abdomen is soft and nontender to palpation. Lungs are clear to auscultation. Cardiac exam reveals a regular rhythm.  Plan: Proceed with screening colonoscopy

## 2015-07-07 NOTE — Transfer of Care (Signed)
Immediate Anesthesia Transfer of Care Note  Patient: Julie Arias  Procedure(s) Performed: Procedure(s): COLONOSCOPY WITH PROPOFOL (N/A)  Patient Location: PACU, Endo Recovery  Anesthesia Type:MAC  Level of Consciousness: Patient easily awoken, sedated, comfortable, cooperative, following commands, responds to stimulation.   Airway & Oxygen Therapy: Patient spontaneously breathing, ventilating well, oxygen via simple oxygen mask.  Post-op Assessment: Report given to PACU RN, vital signs reviewed and stable, moving all extremities.   Post vital signs: Reviewed and stable.  Complications: No apparent anesthesia complications

## 2015-07-07 NOTE — Anesthesia Postprocedure Evaluation (Signed)
Anesthesia Post Note  Patient: Julie Arias  Procedure(s) Performed: Procedure(s) (LRB): COLONOSCOPY WITH PROPOFOL (N/A)  Patient location during evaluation: PACU Anesthesia Type: MAC Level of consciousness: awake and alert Pain management: pain level controlled Vital Signs Assessment: post-procedure vital signs reviewed and stable Respiratory status: spontaneous breathing, nonlabored ventilation, respiratory function stable and patient connected to nasal cannula oxygen Cardiovascular status: blood pressure returned to baseline and stable Postop Assessment: no signs of nausea or vomiting Anesthetic complications: no    Last Vitals:  Filed Vitals:   07/07/15 1120 07/07/15 1128  BP: 136/86 136/91  Pulse: 67 68  Temp:    Resp: 14 14    Last Pain: There were no vitals filed for this visit.               Eleonore Shippee JENNETTE

## 2015-07-07 NOTE — Op Note (Signed)
Procedure: Screening colonoscopy. Normal screening colonoscopy performed on 04/01/2004. History of sigmoid colon diverticulitis complicated by abscess treated medically in the past  Endoscopist: Earle Gell  Premedication: Propofol administered by anesthesia  Procedure: The patient was placed in the left lateral decubitus position. Anal inspection and digital rectal exam were normal. The Pentax pediatric colonoscope was introduced into the rectum and advanced to the cecum. A normal-appearing appendiceal orifice was identified. A normal-appearing ileocecal valve was intubated and the terminal ileum inspected. Colonic preparation exam today was good. Withdrawal time was 10 minutes  Rectum. Normal. Retroflexed view of the distal rectum was normal  Sigmoid colon and descending colon. Colonic diverticulosis  Splenic flexure. Normal  Transverse colon. Normal  Hepatic flexure. Normal  Ascending colon. Normal  Cecum and ileocecal valve. Normal  Terminal ileum. Normal  Assessment: Normal screening colonoscopy  Recommendation: Schedule repeat screening colonoscopy in 10 years

## 2015-07-07 NOTE — Discharge Instructions (Signed)
Colonoscopy, Care After °Refer to this sheet in the next few weeks. These instructions provide you with information on caring for yourself after your procedure. Your health care provider may also give you more specific instructions. Your treatment has been planned according to current medical practices, but problems sometimes occur. Call your health care provider if you have any problems or questions after your procedure. °WHAT TO EXPECT AFTER THE PROCEDURE  °After your procedure, it is typical to have the following: °· A small amount of blood in your stool. °· Moderate amounts of gas and mild abdominal cramping or bloating. °HOME CARE INSTRUCTIONS °· Do not drive, operate machinery, or sign important documents for 24 hours. °· You may shower and resume your regular physical activities, but move at a slower pace for the first 24 hours. °· Take frequent rest periods for the first 24 hours. °· Walk around or put a warm pack on your abdomen to help reduce abdominal cramping and bloating. °· Drink enough fluids to keep your urine clear or pale yellow. °· You may resume your normal diet as instructed by your health care provider. Avoid heavy or fried foods that are hard to digest. °· Avoid drinking alcohol for 24 hours or as instructed by your health care provider. °· Only take over-the-counter or prescription medicines as directed by your health care provider. °· If a tissue sample (biopsy) was taken during your procedure: °¨ Do not take aspirin or blood thinners for 7 days, or as instructed by your health care provider. °¨ Do not drink alcohol for 7 days, or as instructed by your health care provider. °¨ Eat soft foods for the first 24 hours. °SEEK MEDICAL CARE IF: °You have persistent spotting of blood in your stool 2-3 days after the procedure. °SEEK IMMEDIATE MEDICAL CARE IF: °· You have more than a small spotting of blood in your stool. °· You pass large blood clots in your stool. °· Your abdomen is swollen  (distended). °· You have nausea or vomiting. °· You have a fever. °· You have increasing abdominal pain that is not relieved with medicine. °  °This information is not intended to replace advice given to you by your health care provider. Make sure you discuss any questions you have with your health care provider. °  °Document Released: 03/02/2004 Document Revised: 05/09/2013 Document Reviewed: 03/26/2013 °Elsevier Interactive Patient Education ©2016 [employer] ° °

## 2015-07-07 NOTE — Anesthesia Preprocedure Evaluation (Signed)
Anesthesia Evaluation  Patient identified by MRN, date of birth, ID band Patient awake    Reviewed: Allergy & Precautions, NPO status , Patient's Chart, lab work & pertinent test results  History of Anesthesia Complications Negative for: history of anesthetic complications  Airway Mallampati: II  TM Distance: >3 FB Neck ROM: Full    Dental no notable dental hx. (+) Dental Advisory Given   Pulmonary former smoker,    Pulmonary exam normal breath sounds clear to auscultation       Cardiovascular negative cardio ROS Normal cardiovascular exam Rhythm:Regular Rate:Normal     Neuro/Psych negative neurological ROS  negative psych ROS   GI/Hepatic Neg liver ROS, GERD  Medicated and Controlled,  Endo/Other  Hypothyroidism   Renal/GU negative Renal ROS  negative genitourinary   Musculoskeletal negative musculoskeletal ROS (+)   Abdominal   Peds negative pediatric ROS (+)  Hematology negative hematology ROS (+)   Anesthesia Other Findings   Reproductive/Obstetrics negative OB ROS                             Anesthesia Physical Anesthesia Plan  ASA: II  Anesthesia Plan: MAC   Post-op Pain Management:    Induction: Intravenous  Airway Management Planned: Nasal Cannula  Additional Equipment:   Intra-op Plan:   Post-operative Plan:   Informed Consent: I have reviewed the patients History and Physical, chart, labs and discussed the procedure including the risks, benefits and alternatives for the proposed anesthesia with the patient or authorized representative who has indicated his/her understanding and acceptance.   Dental advisory given  Plan Discussed with: CRNA  Anesthesia Plan Comments:         Anesthesia Quick Evaluation

## 2015-07-08 ENCOUNTER — Encounter (HOSPITAL_COMMUNITY): Payer: Self-pay | Admitting: Gastroenterology

## 2017-10-11 ENCOUNTER — Other Ambulatory Visit: Payer: Self-pay | Admitting: Physician Assistant

## 2017-10-11 ENCOUNTER — Ambulatory Visit
Admission: RE | Admit: 2017-10-11 | Discharge: 2017-10-11 | Disposition: A | Discharge status: Home / Self Care | Payer: 59 | Source: Ambulatory Visit | Attending: Physician Assistant | Admitting: Physician Assistant

## 2017-10-11 DIAGNOSIS — R109 Unspecified abdominal pain: Secondary | ICD-10-CM

## 2017-10-11 MED ORDER — IOPAMIDOL (ISOVUE-300) INJECTION 61%: 100.0000 mL | Freq: Once | INTRAVENOUS | Status: DC | PRN

## 2018-03-15 DIAGNOSIS — R310 Gross hematuria: Secondary | ICD-10-CM | POA: Diagnosis not present

## 2018-03-15 DIAGNOSIS — K5792 Diverticulitis of intestine, part unspecified, without perforation or abscess without bleeding: Secondary | ICD-10-CM | POA: Diagnosis not present

## 2018-03-17 DIAGNOSIS — R319 Hematuria, unspecified: Secondary | ICD-10-CM | POA: Diagnosis not present

## 2018-04-26 DIAGNOSIS — Z23 Encounter for immunization: Secondary | ICD-10-CM | POA: Diagnosis not present

## 2018-05-03 DIAGNOSIS — H524 Presbyopia: Secondary | ICD-10-CM | POA: Diagnosis not present

## 2018-05-03 DIAGNOSIS — H2513 Age-related nuclear cataract, bilateral: Secondary | ICD-10-CM | POA: Diagnosis not present

## 2018-07-19 DIAGNOSIS — E7800 Pure hypercholesterolemia, unspecified: Secondary | ICD-10-CM | POA: Diagnosis not present

## 2018-07-19 DIAGNOSIS — I10 Essential (primary) hypertension: Secondary | ICD-10-CM | POA: Diagnosis not present

## 2018-07-19 DIAGNOSIS — E039 Hypothyroidism, unspecified: Secondary | ICD-10-CM | POA: Diagnosis not present

## 2018-07-19 DIAGNOSIS — M1990 Unspecified osteoarthritis, unspecified site: Secondary | ICD-10-CM | POA: Diagnosis not present

## 2018-12-01 HISTORY — PX: GANGLION CYST EXCISION: SHX1691

## 2018-12-05 ENCOUNTER — Ambulatory Visit (INDEPENDENT_AMBULATORY_CARE_PROVIDER_SITE_OTHER): Payer: Medicare HMO

## 2018-12-05 ENCOUNTER — Other Ambulatory Visit: Payer: Self-pay

## 2018-12-05 ENCOUNTER — Ambulatory Visit: Payer: Medicare HMO | Admitting: Podiatry

## 2018-12-05 ENCOUNTER — Encounter: Payer: Self-pay | Admitting: Podiatry

## 2018-12-05 VITALS — Temp 96.2°F | Resp 16

## 2018-12-05 DIAGNOSIS — M79671 Pain in right foot: Secondary | ICD-10-CM | POA: Diagnosis not present

## 2018-12-05 DIAGNOSIS — M67471 Ganglion, right ankle and foot: Secondary | ICD-10-CM | POA: Diagnosis not present

## 2018-12-08 ENCOUNTER — Ambulatory Visit: Payer: Self-pay | Admitting: Podiatry

## 2018-12-11 ENCOUNTER — Other Ambulatory Visit: Payer: Self-pay

## 2018-12-11 ENCOUNTER — Ambulatory Visit (INDEPENDENT_AMBULATORY_CARE_PROVIDER_SITE_OTHER): Payer: Medicare HMO | Admitting: Podiatry

## 2018-12-11 DIAGNOSIS — M7989 Other specified soft tissue disorders: Secondary | ICD-10-CM

## 2018-12-11 DIAGNOSIS — M79674 Pain in right toe(s): Secondary | ICD-10-CM | POA: Diagnosis not present

## 2018-12-11 DIAGNOSIS — M67471 Ganglion, right ankle and foot: Secondary | ICD-10-CM | POA: Diagnosis not present

## 2018-12-11 NOTE — Patient Instructions (Signed)
Pre-Operative Instructions  Congratulations, you have decided to take an important step towards improving your quality of life.  You can be assured that the doctors and staff at [facility] will be with you every step of the way.  Here are some important things you should know:  1. Plan to be at the surgery center/hospital at least 1 (one) hour prior to your scheduled time, unless otherwise directed by the surgical center/hospital staff.  You must have a responsible adult accompany you, remain during the surgery and drive you home.  Make sure you have directions to the surgical center/hospital to ensure you arrive on time. 2. If you are having surgery at [facility] or [facility] hospitals, you will need a copy of your medical history and physical form from your family physician within one month prior to the date of surgery. We will give you a form for your primary physician to complete.  3. We make every effort to accommodate the date you request for surgery.  However, there are times where surgery dates or times have to be moved.  We will contact you as soon as possible if a change in schedule is required.   4. No aspirin/ibuprofen for one week before surgery.  If you are on aspirin, any non-steroidal anti-inflammatory medications (Mobic, Aleve, Ibuprofen) should not be taken seven (7) days prior to your surgery.  You make take Tylenol for pain prior to surgery.  5. Medications - If you are taking daily heart and blood pressure medications, seizure, reflux, allergy, asthma, anxiety, pain or diabetes medications, make sure you notify the surgery center/hospital before the day of surgery so they can tell you which medications you should take or avoid the day of surgery. 6. No food or drink after midnight the night before surgery unless directed otherwise by surgical center/hospital staff. 7. No alcoholic beverages 24-hours prior to surgery.  No smoking 24-hours prior or 24-hours after  surgery. 8. Wear loose pants or shorts. They should be loose enough to fit over bandages, boots, and casts. 9. Don't wear slip-on shoes. Sneakers are preferred. 10. Bring your boot with you to the surgery center/hospital.  Also bring crutches or a walker if your physician has prescribed it for you.  If you do not have this equipment, it will be provided for you after surgery. 11. If you have not been contacted by the surgery center/hospital by the day before your surgery, call to confirm the date and time of your surgery. 12. Leave-time from work may vary depending on the type of surgery you have.  Appropriate arrangements should be made prior to surgery with your employer. 13. Prescriptions will be provided immediately following surgery by your doctor.  Fill these as soon as possible after surgery and take the medication as directed. Pain medications will not be refilled on weekends and must be approved by the doctor. 14. Remove nail polish on the operative foot and avoid getting pedicures prior to surgery. 15. Wash the night before surgery.  The night before surgery wash the foot and leg well with water and the antibacterial soap provided. Be sure to pay special attention to beneath the toenails and in between the toes.  Wash for at least three (3) minutes. Rinse thoroughly with water and dry well with a towel.  Perform this wash unless told not to do so by your physician.  Enclosed: 1 Ice pack (please put in freezer the night before surgery)   1 Hibiclens skin cleaner     Pre-op instructions  If you have any questions regarding the instructions, please do not hesitate to call our office.  [place]: [address], , [place] [address] -- [phone]  [place]: [address] [place], [place] [address] -- [phone]  [place]: [address]  [place], [place] [address] -- [phone]  [place]: [address], [address], [place], [place] [address] -- [phone]  Website:  [email or web address] 

## 2018-12-12 ENCOUNTER — Telehealth: Payer: Self-pay | Admitting: *Deleted

## 2018-12-12 NOTE — Telephone Encounter (Signed)
"  I saw the doctor in [place] this morning and I'm supposed to call you to set up my surgery.  Please call me."

## 2018-12-12 NOTE — Progress Notes (Signed)
  Subjective:  Patient ID: Julie Arias, female    DOB: 1950/11/10,  MRN: [id number]  No chief complaint on file.   68 y.o. female presents for follow-up of the cyst area.  States that the area refilled less than 24 hours after it was aspirated.  Thinks that it is now a little bigger in her toe areas with slight swelling.   Review of Systems: Negative except as noted in the HPI. Denies N/V/F/Ch.  Past Medical History:  Diagnosis Date  . Diverticulitis    caused "perforated colon" hospitalized- no surgery  . GERD (gastroesophageal reflux disease)   . Hypothyroidism   . Thyroid disease     Current Outpatient Medications:  .  EUTHYROX 175 MCG tablet, TAKE 1 TABLET BY MOUTH ONCE DAILY IN THE MORNING ON AN EMPTY STOMACH, Disp: , Rfl:  .  levothyroxine (SYNTHROID, LEVOTHROID) 150 MCG tablet, Take 150 mcg by mouth daily before breakfast., Disp: , Rfl:  .  LORazepam (ATIVAN) 0.5 MG tablet, Take 0.5 mg by mouth every 8 (eight) hours as needed for anxiety., Disp: , Rfl:  .  omeprazole (PRILOSEC) 40 MG capsule, Take 40 mg by mouth daily., Disp: , Rfl:  .  traMADol (ULTRAM) 50 MG tablet, Take 50 mg by mouth every 6 (six) hours as needed (Pain). , Disp: , Rfl:  .  valACYclovir (VALTREX) 1000 MG tablet, TAKE 2 TABLETS BY MOUTH EVERY 12 HOURS AS NEEDED FOR COLD SORES, Disp: , Rfl:   Social History   Tobacco Use  Smoking Status Former Smoker  . Packs/day: 1.00  . Years: 25.00  . Pack years: 25.00  . Types: Cigarettes  . Last attempt to quit: 01/03/2004  . Years since quitting: 14.9  Smokeless Tobacco Never Used    Allergies  Allergen Reactions  . Aleve [Naproxen Sodium] Rash   Objective:  There were no vitals filed for this visit. There is no height or weight on file to calculate BMI. Constitutional Well developed. Well nourished.  Vascular Dorsalis pedis pulses palpable bilaterally. Posterior tibial pulses palpable bilaterally. Capillary refill normal to all digits.  No  cyanosis or clubbing noted. Pedal hair growth normal.  Neurologic Normal speech. Oriented to person, place, and time. Epicritic sensation to light touch grossly present bilaterally.  Dermatologic Nails well groomed and normal in appearance. No open wounds. No skin lesions.  Orthopedic: Palpable cyst right forefoot.   Radiographs: None Assessment:   1. Ganglion cyst of right foot   2. Pain and swelling of toe of right foot    Plan:  Patient was evaluated and treated and all questions answered.  Ganglion Cyst with Recurrence -Discussed with patient due to the recurrence of the cyst shortly after aspiration that she would benefit from surgical excision. Patient wants to proceed -Patient has failed all conservative therapy and wishes to proceed with surgical intervention. All risks, benefits, and alternatives discussed with patient. No guarantees given. Consent reviewed and signed by patient. -Planned procedures: Excision of ganglion cyst right foot.   No follow-ups on file.

## 2018-12-14 NOTE — Telephone Encounter (Signed)
"  I need to schedule my surgery with Dr. March Rummage."  Do you have a date that you like?  "I'd like to do it as soon as possible."  Dr. March Rummage can do it on Dec 20, 2018.  "That will be fine."  Someone from the surgical center will give you a call a day or two prior to your surgery date and she will give you your arrival time.  You need to register online with the surgical center via their online portal.  The instructions are in the brochure that we gave you.  "I'll figure it out."

## 2018-12-15 ENCOUNTER — Telehealth: Payer: Self-pay | Admitting: *Deleted

## 2018-12-15 NOTE — Telephone Encounter (Signed)
DOS 12/20/2018, CPT CODE - 00979- EXC. GANGLION/TUMOR RT.  [employer] MEDICARE:  EFFECTIVE DATE 08/02/2018  CO-PAY: $200 per visit CO-INSURANCE: 100% DEDUCTIBLE: $200  $4200 max out of pocket: not met.  PRE-CERT IS NOT REQUIRED - PARTICIPATING PROVIDERS  CALL REF#: [id number]

## 2018-12-20 ENCOUNTER — Other Ambulatory Visit: Payer: Self-pay | Admitting: Podiatry

## 2018-12-20 ENCOUNTER — Encounter: Payer: Self-pay | Admitting: Podiatry

## 2018-12-20 ENCOUNTER — Telehealth: Payer: Self-pay | Admitting: *Deleted

## 2018-12-20 DIAGNOSIS — K219 Gastro-esophageal reflux disease without esophagitis: Secondary | ICD-10-CM | POA: Diagnosis not present

## 2018-12-20 DIAGNOSIS — M67471 Ganglion, right ankle and foot: Secondary | ICD-10-CM | POA: Diagnosis not present

## 2018-12-20 MED ORDER — OXYCODONE-ACETAMINOPHEN 5-325 MG PO TABS: 1.0000 | ORAL_TABLET | ORAL | 0 refills | Status: DC | PRN

## 2018-12-20 MED ORDER — ONDANSETRON HCL 4 MG PO TABS: 4.0000 mg | ORAL_TABLET | Freq: Three times a day (TID) | ORAL | 0 refills | Status: DC | PRN

## 2018-12-20 MED ORDER — CEPHALEXIN 500 MG PO CAPS: 500.0000 mg | ORAL_CAPSULE | Freq: Two times a day (BID) | ORAL | 0 refills | Status: DC

## 2018-12-20 NOTE — Telephone Encounter (Signed)
Pt states the [facility] is still without power and she would like the medications sent to [facility] in [place] on [address].

## 2018-12-20 NOTE — Telephone Encounter (Signed)
I called pt's [facility] and recording repeated twice without prompts to leave message or speak with pharmacy staff.

## 2018-12-20 NOTE — Progress Notes (Signed)
Rx sent again

## 2018-12-20 NOTE — Telephone Encounter (Signed)
Dr. March Rummage called states if the [facility] is open then pt should get the medications there otherwise she may not be able to get the pain medication in a timely manner. I called pt and she states [facility] just called with one of the medications filled. I told pt to call [facility] and make sure all of the mediations will be filled.

## 2018-12-20 NOTE — Progress Notes (Signed)
Rx sent to pharmacy for outpatient surgery. °

## 2018-12-20 NOTE — Telephone Encounter (Signed)
I called pt and she states power is out on that side of town and is estimated to return after 12:00pm today. I asked pt to call me when the power comes on and I would check if rx in at her [facility] or call to another. Pt states understanding.

## 2018-12-21 ENCOUNTER — Telehealth: Payer: Self-pay

## 2018-12-21 NOTE — Telephone Encounter (Signed)
POST OP CALL-    1) General condition stated by the patient: Doing well  2) Is the pt having pain? Some, but it's mild  3) Pain score:   4) Has the pt taken Rx'd pain medication, regularly or PRN?   5) Is the pain medication giving relief? Hasn't taken any  6) Any fever, chills, nausea, or vomiting, shortness of breath or tightness in calf? None  7) Is the bandage clean, dry and intact? Yes, feels a bit loose but is still applied.  8) Is there excessive tightness, bleeding or drainage coming through the bandage? No  9) Did you understand all of the post op instruction sheet given? Yes  10) Any questions or concerns regarding post op care/recovery? no   Confirmed POV appointment with patient

## 2018-12-26 ENCOUNTER — Encounter: Payer: Self-pay | Admitting: Podiatry

## 2018-12-26 ENCOUNTER — Other Ambulatory Visit: Payer: Self-pay

## 2018-12-26 ENCOUNTER — Ambulatory Visit (INDEPENDENT_AMBULATORY_CARE_PROVIDER_SITE_OTHER): Payer: Medicare HMO | Admitting: Podiatry

## 2018-12-26 VITALS — BP 157/99 | HR 92 | Temp 98.0°F | Resp 16

## 2018-12-26 DIAGNOSIS — M67471 Ganglion, right ankle and foot: Secondary | ICD-10-CM

## 2018-12-26 DIAGNOSIS — Z9889 Other specified postprocedural states: Secondary | ICD-10-CM

## 2018-12-26 NOTE — Progress Notes (Signed)
Subjective:  Patient ID: Julie Arias, female    DOB: 05/11/1951,  MRN: [id number]  Chief Complaint  Patient presents with  . Routine Post Op    POV#1 DOS 12/20/2018 EXC. GANGLION/TUMOR RT Pt. states," it's black/blue, but I think it's looking great. Pain is a 5/10 sorenss." -dressing fell off Sunday pt states she adjusted it Tx: sx shoe, oxy (PRN), abx and elevation --pt denie sN/V/?FCH    DOS: 12/20/2018 Procedure: Excision of ganglion right foot  68 y.o. female returns for post-op check.  History above confirmed with patient.  Review of Systems: Negative except as noted in the HPI. Denies N/V/F/Ch.  Past Medical History:  Diagnosis Date  . Diverticulitis    caused "perforated colon" hospitalized- no surgery  . GERD (gastroesophageal reflux disease)   . Hypothyroidism   . Thyroid disease     Current Outpatient Medications:  .  cephALEXin (KEFLEX) 500 MG capsule, Take 1 capsule (500 mg total) by mouth 2 (two) times daily., Disp: 14 capsule, Rfl: 0 .  EUTHYROX 175 MCG tablet, TAKE 1 TABLET BY MOUTH ONCE DAILY IN THE MORNING ON AN EMPTY STOMACH, Disp: , Rfl:  .  levothyroxine (SYNTHROID, LEVOTHROID) 150 MCG tablet, Take 150 mcg by mouth daily before breakfast., Disp: , Rfl:  .  LORazepam (ATIVAN) 0.5 MG tablet, Take 0.5 mg by mouth every 8 (eight) hours as needed for anxiety., Disp: , Rfl:  .  omeprazole (PRILOSEC) 40 MG capsule, Take 40 mg by mouth daily., Disp: , Rfl:  .  ondansetron (ZOFRAN) 4 MG tablet, Take 1 tablet (4 mg total) by mouth every 8 (eight) hours as needed for nausea or vomiting., Disp: 20 tablet, Rfl: 0 .  oxyCODONE-acetaminophen (PERCOCET) 5-325 MG tablet, Take 1 tablet by mouth every 4 (four) hours as needed for severe pain., Disp: 20 tablet, Rfl: 0 .  traMADol (ULTRAM) 50 MG tablet, Take 50 mg by mouth every 6 (six) hours as needed (Pain). , Disp: , Rfl:  .  valACYclovir (VALTREX) 1000 MG tablet, TAKE 2 TABLETS BY MOUTH EVERY 12 HOURS AS NEEDED FOR COLD  SORES, Disp: , Rfl:   Social History   Tobacco Use  Smoking Status Former Smoker  . Packs/day: 1.00  . Years: 25.00  . Pack years: 25.00  . Types: Cigarettes  . Last attempt to quit: 01/03/2004  . Years since quitting: 14.9  Smokeless Tobacco Never Used    Allergies  Allergen Reactions  . Aleve [Naproxen Sodium] Rash   Objective:   Vitals:   12/26/18 1503  BP: (!) 157/99  Pulse: 92  Resp: 16  Temp: 98 F (36.7 C)   There is no height or weight on file to calculate BMI. Constitutional Well developed. Well nourished.  Vascular Foot warm and well perfused. Capillary refill normal to all digits.   Neurologic Normal speech. Oriented to person, place, and time. Epicritic sensation to light touch grossly present bilaterally.  Dermatologic Skin healing well without signs of infection. Skin edges well coapted without signs of infection.  Slight bruising contusion  Orthopedic: Tenderness to palpation noted about the surgical site.   Radiographs: None Assessment:   1. Post-operative state   2. Ganglion cyst of right foot    Plan:  Patient was evaluated and treated and all questions answered.  S/p foot surgery right -Progressing as expected post-operatively. -XR: None -WB Status: Weight-bear as tolerated in surgical shoe -Sutures: Intact at none refilled. -Medications: None refilled -Foot redressed.  Return in about 1 week (  around 01/02/2019) for Post-op.

## 2018-12-30 NOTE — Progress Notes (Signed)
Subjective:  Patient ID: Julie Arias, female    DOB: [birth year],  MRN: [id number]  Chief Complaint  Patient presents with  . Cyst    ganglion cyst flar up on right dorsal foot x 1 yr; 8/10 sharp constant pain -pt deneis N/v/f/Chg Tx: none     68 y.o. female presents with the above complaint.  History above confirmed with patient   Review of Systems: Negative except as noted in the HPI. Denies N/V/F/Ch.  Past Medical History:  Diagnosis Date  . Diverticulitis    caused "perforated colon" hospitalized- no surgery  . GERD (gastroesophageal reflux disease)   . Hypothyroidism   . Thyroid disease     Current Outpatient Medications:  .  EUTHYROX 175 MCG tablet, TAKE 1 TABLET BY MOUTH ONCE DAILY IN THE MORNING ON AN EMPTY STOMACH, Disp: , Rfl:  .  levothyroxine (SYNTHROID, LEVOTHROID) 150 MCG tablet, Take 150 mcg by mouth daily before breakfast., Disp: , Rfl:  .  LORazepam (ATIVAN) 0.5 MG tablet, Take 0.5 mg by mouth every 8 (eight) hours as needed for anxiety., Disp: , Rfl:  .  omeprazole (PRILOSEC) 40 MG capsule, Take 40 mg by mouth daily., Disp: , Rfl:  .  traMADol (ULTRAM) 50 MG tablet, Take 50 mg by mouth every 6 (six) hours as needed (Pain). , Disp: , Rfl:  .  valACYclovir (VALTREX) 1000 MG tablet, TAKE 2 TABLETS BY MOUTH EVERY 12 HOURS AS NEEDED FOR COLD SORES, Disp: , Rfl:  .  cephALEXin (KEFLEX) 500 MG capsule, Take 1 capsule (500 mg total) by mouth 2 (two) times daily., Disp: 14 capsule, Rfl: 0 .  ondansetron (ZOFRAN) 4 MG tablet, Take 1 tablet (4 mg total) by mouth every 8 (eight) hours as needed for nausea or vomiting., Disp: 20 tablet, Rfl: 0 .  oxyCODONE-acetaminophen (PERCOCET) 5-325 MG tablet, Take 1 tablet by mouth every 4 (four) hours as needed for severe pain., Disp: 20 tablet, Rfl: 0  Social History   Tobacco Use  Smoking Status Former Smoker  . Packs/day: 1.00  . Years: 25.00  . Pack years: 25.00  . Types: Cigarettes  . Last attempt to quit: 01/03/2004  .  Years since quitting: 15.0  Smokeless Tobacco Never Used    Allergies  Allergen Reactions  . Aleve [Naproxen Sodium] Rash   Objective:   Vitals:   12/05/18 1539  Resp: 16  Temp: (!) 96.2 F (35.7 C)   There is no height or weight on file to calculate BMI. Constitutional Well developed. Well nourished.  Vascular Dorsalis pedis pulses palpable bilaterally. Posterior tibial pulses palpable bilaterally. Capillary refill normal to all digits.  No cyanosis or clubbing noted. Pedal hair growth normal.  Neurologic Normal speech. Oriented to person, place, and time. Epicritic sensation to light touch grossly present bilaterally.  Dermatologic Nails well groomed and normal in appearance. Large cyst noted about the right dorsal forefoot adjacent to the second extensor tendon fluctuant.  Orthopedic: Normal joint ROM without pain or crepitus bilaterally. No visible deformities. No bony tenderness.   Radiographs: Taken and reviewed no acute fracture dislocations no underlying osseous abnormalities Assessment:   1. Ganglion cyst of right foot   2. Right foot pain    Plan:  Patient was evaluated and treated and all questions answered.  Ganglion cyst right foot -Discussed with patient conservative versus surgical intervention for this issue.  Patient elects to proceed with aspiration today.  Following sterile skin prep the area was aspirated with an 18-gauge needle.  Mucinous discharge was aspirated.  The area was then further thoroughly expressed of mucinous discharge.  A compression dressing was applied patient was advised to leave the dressing on for 24 hours. -Follow-up in 6 weeks for recheck -Advised that should the lesion recur we would consider surgical intervention  Return in about 6 weeks (around 01/16/2019) for Ganglion cyst f/u.

## 2019-01-02 ENCOUNTER — Encounter: Payer: Self-pay | Admitting: Podiatry

## 2019-01-02 ENCOUNTER — Ambulatory Visit (INDEPENDENT_AMBULATORY_CARE_PROVIDER_SITE_OTHER): Payer: Medicare HMO | Admitting: Podiatry

## 2019-01-02 ENCOUNTER — Other Ambulatory Visit: Payer: Self-pay

## 2019-01-02 VITALS — BP 159/85 | HR 88 | Temp 96.8°F | Resp 16

## 2019-01-02 DIAGNOSIS — Z9889 Other specified postprocedural states: Secondary | ICD-10-CM

## 2019-01-02 NOTE — Progress Notes (Signed)
  Subjective:  Patient ID: Julie Arias, female    DOB: [birth year],  MRN: [id number]  Chief Complaint  Patient presents with  . Routine Post Op    POV#2 DOS 12/20/2018 EXC. GANGLION/TUMOR RT -Pt states," stitches feels dry and tight, otherwise doing fine." Tx: sx shoe -pt deneis N/V/F?Ch     DOS: 12/20/2018 Procedure: Excision of ganglion right foot  68 y.o. female returns for post-op check.  History above confirmed with patient.  Review of Systems: Negative except as noted in the HPI. Denies N/V/F/Ch.  Past Medical History:  Diagnosis Date  . Diverticulitis    caused "perforated colon" hospitalized- no surgery  . GERD (gastroesophageal reflux disease)   . Hypothyroidism   . Thyroid disease     Current Outpatient Medications:  .  cephALEXin (KEFLEX) 500 MG capsule, Take 1 capsule (500 mg total) by mouth 2 (two) times daily., Disp: 14 capsule, Rfl: 0 .  EUTHYROX 175 MCG tablet, TAKE 1 TABLET BY MOUTH ONCE DAILY IN THE MORNING ON AN EMPTY STOMACH, Disp: , Rfl:  .  levothyroxine (SYNTHROID, LEVOTHROID) 150 MCG tablet, Take 150 mcg by mouth daily before breakfast., Disp: , Rfl:  .  LORazepam (ATIVAN) 0.5 MG tablet, Take 0.5 mg by mouth every 8 (eight) hours as needed for anxiety., Disp: , Rfl:  .  omeprazole (PRILOSEC) 40 MG capsule, Take 40 mg by mouth daily., Disp: , Rfl:  .  ondansetron (ZOFRAN) 4 MG tablet, Take 1 tablet (4 mg total) by mouth every 8 (eight) hours as needed for nausea or vomiting., Disp: 20 tablet, Rfl: 0 .  oxyCODONE-acetaminophen (PERCOCET) 5-325 MG tablet, Take 1 tablet by mouth every 4 (four) hours as needed for severe pain., Disp: 20 tablet, Rfl: 0 .  traMADol (ULTRAM) 50 MG tablet, Take 50 mg by mouth every 6 (six) hours as needed (Pain). , Disp: , Rfl:  .  valACYclovir (VALTREX) 1000 MG tablet, TAKE 2 TABLETS BY MOUTH EVERY 12 HOURS AS NEEDED FOR COLD SORES, Disp: , Rfl:   Social History   Tobacco Use  Smoking Status Former Smoker  . Packs/day: 1.00   . Years: 25.00  . Pack years: 25.00  . Types: Cigarettes  . Last attempt to quit: 01/03/2004  . Years since quitting: 15.0  Smokeless Tobacco Never Used    Allergies  Allergen Reactions  . Aleve [Naproxen Sodium] Rash   Objective:   Vitals:   01/02/19 1123  BP: (!) 159/85  Pulse: 88  Resp: 16  Temp: (!) 96.8 F (36 C)   There is no height or weight on file to calculate BMI. Constitutional Well developed. Well nourished.  Vascular Foot warm and well perfused. Capillary refill normal to all digits.   Neurologic Normal speech. Oriented to person, place, and time. Epicritic sensation to light touch grossly present bilaterally.  Dermatologic Skin healing well without signs of infection. Skin edges well coapted without signs of infection.   Orthopedic: No tenderness noted to palpation noted about the surgical site.   Radiographs: None Assessment:   1. Post-operative state    Plan:  Patient was evaluated and treated and all questions answered.  S/p foot surgery right -Progressing as expected post-operatively. -Sutures removed. Steris applied. Ok to shower but not soak. -Transition out of surgical shoe in 1 week. -F/u in 1 month for recheck.  Return in about 1 month (around 02/01/2019) for post-op check .

## 2019-01-16 ENCOUNTER — Ambulatory Visit: Payer: Medicare HMO | Admitting: Podiatry

## 2019-01-26 ENCOUNTER — Telehealth: Payer: Self-pay

## 2019-01-26 NOTE — Telephone Encounter (Signed)
Patient called stating that she thinks her surgical foot is infected. She had surgery 2 weeks ago.

## 2019-01-26 NOTE — Telephone Encounter (Signed)
Dr March Rummage please advise possible antibiotics?

## 2019-01-28 NOTE — Telephone Encounter (Signed)
Rx keflex and get her sooner appt please

## 2019-01-29 MED ORDER — CEPHALEXIN 500 MG PO CAPS: 500.0000 mg | ORAL_CAPSULE | Freq: Two times a day (BID) | ORAL | 0 refills | Status: DC

## 2019-01-29 NOTE — Telephone Encounter (Signed)
cALLED AND SPOKE WITH PATIENT MOVED PPT UP AND SENT ANTIBIOTIC

## 2019-01-30 ENCOUNTER — Other Ambulatory Visit: Payer: Self-pay | Admitting: Podiatry

## 2019-01-30 ENCOUNTER — Ambulatory Visit (INDEPENDENT_AMBULATORY_CARE_PROVIDER_SITE_OTHER): Payer: Medicare HMO | Admitting: Podiatry

## 2019-01-30 ENCOUNTER — Other Ambulatory Visit: Payer: Self-pay

## 2019-01-30 DIAGNOSIS — T8141XA Infection following a procedure, superficial incisional surgical site, initial encounter: Secondary | ICD-10-CM

## 2019-01-30 DIAGNOSIS — M67471 Ganglion, right ankle and foot: Secondary | ICD-10-CM

## 2019-01-30 DIAGNOSIS — Z9889 Other specified postprocedural states: Secondary | ICD-10-CM

## 2019-01-30 NOTE — Progress Notes (Signed)
  Subjective:  Patient ID: Julie Arias, female    DOB: 1950/10/23,  MRN: [id number]  No chief complaint on file.  DOS: 12/20/2018 Procedure: Excision of ganglion right foot  68 y.o. female returns for post-op check.  States that the incision area became swollen and had redness.  Was on Cipro at the time.  States that it did get a little better after starting the Keflex  Review of Systems: Negative except as noted in the HPI. Denies N/V/F/Ch.  Past Medical History:  Diagnosis Date  . Diverticulitis    caused "perforated colon" hospitalized- no surgery  . GERD (gastroesophageal reflux disease)   . Hypothyroidism   . Thyroid disease     Current Outpatient Medications:  .  cephALEXin (KEFLEX) 500 MG capsule, Take 1 capsule (500 mg total) by mouth 2 (two) times daily., Disp: 14 capsule, Rfl: 0 .  ciprofloxacin (CIPRO) 250 MG tablet, TAKE 1 TABLET BY MOUTH EVERY 12 HOURS FOR 10 DAYS, Disp: , Rfl:  .  EUTHYROX 175 MCG tablet, TAKE 1 TABLET BY MOUTH ONCE DAILY IN THE MORNING ON AN EMPTY STOMACH, Disp: , Rfl:  .  levothyroxine (SYNTHROID, LEVOTHROID) 150 MCG tablet, Take 150 mcg by mouth daily before breakfast., Disp: , Rfl:  .  LORazepam (ATIVAN) 0.5 MG tablet, Take 0.5 mg by mouth every 8 (eight) hours as needed for anxiety., Disp: , Rfl:  .  metroNIDAZOLE (FLAGYL) 500 MG tablet, TAKE 1 TABLET BY MOUTH THREE TIMES DAILY FOR 10 DAYS, Disp: , Rfl:  .  omeprazole (PRILOSEC) 40 MG capsule, Take 40 mg by mouth daily., Disp: , Rfl:  .  ondansetron (ZOFRAN) 4 MG tablet, Take 1 tablet (4 mg total) by mouth every 8 (eight) hours as needed for nausea or vomiting., Disp: 20 tablet, Rfl: 0 .  oxyCODONE-acetaminophen (PERCOCET) 5-325 MG tablet, Take 1 tablet by mouth every 4 (four) hours as needed for severe pain., Disp: 20 tablet, Rfl: 0 .  traMADol (ULTRAM) 50 MG tablet, Take 50 mg by mouth every 6 (six) hours as needed (Pain). , Disp: , Rfl:  .  valACYclovir (VALTREX) 1000 MG tablet, TAKE 2  TABLETS BY MOUTH EVERY 12 HOURS AS NEEDED FOR COLD SORES, Disp: , Rfl:   Social History   Tobacco Use  Smoking Status Former Smoker  . Packs/day: 1.00  . Years: 25.00  . Pack years: 25.00  . Types: Cigarettes  . Quit date: 01/03/2004  . Years since quitting: 15.0  Smokeless Tobacco Never Used    Allergies  Allergen Reactions  . Aleve [Naproxen Sodium] Rash   Objective:   There were no vitals filed for this visit. There is no height or weight on file to calculate BMI. Constitutional Well developed. Well nourished.  Vascular Foot warm and well perfused. Capillary refill normal to all digits.   Neurologic Normal speech. Oriented to person, place, and time. Epicritic sensation to light touch grossly present bilaterally.  Dermatologic Surgical wound right foot appears essentially healed but with one or two areas of deep stitch rejection.    Orthopedic: No tenderness noted to palpation noted about the surgical site.   Radiographs: None Assessment:   1. Post-operative state   2. Postoperative stitch abscess    Plan:  Patient was evaluated and treated and all questions answered.  S/p foot surgery right -Possible stitch abscess -Continue abx to completion -Soak in epsom salt and apply ointment and band-aid. -Follow-up in 2 weeks for recheck  No follow-ups on file.

## 2019-01-31 DIAGNOSIS — Z1231 Encounter for screening mammogram for malignant neoplasm of breast: Secondary | ICD-10-CM | POA: Diagnosis not present

## 2019-02-04 ENCOUNTER — Other Ambulatory Visit: Payer: Self-pay

## 2019-02-04 ENCOUNTER — Inpatient Hospital Stay (HOSPITAL_COMMUNITY)
Admission: EM | Admit: 2019-02-04 | Discharge: 2019-02-07 | DRG: 392 | Disposition: A | Discharge status: Home / Self Care | Payer: Medicare HMO | Attending: Internal Medicine | Admitting: Internal Medicine

## 2019-02-04 ENCOUNTER — Emergency Department (HOSPITAL_COMMUNITY): Payer: Medicare HMO

## 2019-02-04 ENCOUNTER — Encounter (HOSPITAL_COMMUNITY): Payer: Self-pay

## 2019-02-04 DIAGNOSIS — Z87891 Personal history of nicotine dependence: Secondary | ICD-10-CM

## 2019-02-04 DIAGNOSIS — R69 Illness, unspecified: Secondary | ICD-10-CM | POA: Diagnosis not present

## 2019-02-04 DIAGNOSIS — Z79890 Hormone replacement therapy (postmenopausal): Secondary | ICD-10-CM | POA: Diagnosis not present

## 2019-02-04 DIAGNOSIS — K5732 Diverticulitis of large intestine without perforation or abscess without bleeding: Principal | ICD-10-CM | POA: Diagnosis present

## 2019-02-04 DIAGNOSIS — Z79899 Other long term (current) drug therapy: Secondary | ICD-10-CM

## 2019-02-04 DIAGNOSIS — Z1159 Encounter for screening for other viral diseases: Secondary | ICD-10-CM | POA: Diagnosis not present

## 2019-02-04 DIAGNOSIS — K5792 Diverticulitis of intestine, part unspecified, without perforation or abscess without bleeding: Secondary | ICD-10-CM | POA: Diagnosis not present

## 2019-02-04 DIAGNOSIS — Z90710 Acquired absence of both cervix and uterus: Secondary | ICD-10-CM

## 2019-02-04 DIAGNOSIS — K219 Gastro-esophageal reflux disease without esophagitis: Secondary | ICD-10-CM | POA: Diagnosis present

## 2019-02-04 DIAGNOSIS — E039 Hypothyroidism, unspecified: Secondary | ICD-10-CM | POA: Diagnosis present

## 2019-02-04 DIAGNOSIS — Z886 Allergy status to analgesic agent status: Secondary | ICD-10-CM

## 2019-02-04 DIAGNOSIS — E876 Hypokalemia: Secondary | ICD-10-CM | POA: Diagnosis not present

## 2019-02-04 DIAGNOSIS — F419 Anxiety disorder, unspecified: Secondary | ICD-10-CM | POA: Diagnosis present

## 2019-02-04 DIAGNOSIS — R109 Unspecified abdominal pain: Secondary | ICD-10-CM | POA: Diagnosis not present

## 2019-02-04 DIAGNOSIS — R1032 Left lower quadrant pain: Secondary | ICD-10-CM | POA: Diagnosis not present

## 2019-02-04 DIAGNOSIS — Z20828 Contact with and (suspected) exposure to other viral communicable diseases: Secondary | ICD-10-CM | POA: Diagnosis not present

## 2019-02-04 LAB — CBC WITH DIFFERENTIAL/PLATELET
Abs Immature Granulocytes: 0.02 10*3/uL (ref 0.00–0.07)
Basophils Absolute: 0 10*3/uL (ref 0.0–0.1)
Basophils Relative: 0 %
Eosinophils Absolute: 0.1 10*3/uL (ref 0.0–0.5)
Eosinophils Relative: 1 %
HCT: 44.6 % (ref 36.0–46.0)
Hemoglobin: 14.3 g/dL (ref 12.0–15.0)
Immature Granulocytes: 0 %
Lymphocytes Relative: 26 %
Lymphs Abs: 2 10*3/uL (ref 0.7–4.0)
MCH: 29.7 pg (ref 26.0–34.0)
MCHC: 32.1 g/dL (ref 30.0–36.0)
MCV: 92.5 fL (ref 80.0–100.0)
Monocytes Absolute: 0.7 10*3/uL (ref 0.1–1.0)
Monocytes Relative: 9 %
Neutro Abs: 5 10*3/uL (ref 1.7–7.7)
Neutrophils Relative %: 64 %
Platelets: 180 10*3/uL (ref 150–400)
RBC: 4.82 MIL/uL (ref 3.87–5.11)
RDW: 11.9 % (ref 11.5–15.5)
WBC: 7.8 10*3/uL (ref 4.0–10.5)
nRBC: 0 % (ref 0.0–0.2)

## 2019-02-04 LAB — URINALYSIS, ROUTINE W REFLEX MICROSCOPIC
Bilirubin Urine: NEGATIVE
Glucose, UA: NEGATIVE mg/dL
Ketones, ur: 5 mg/dL — AB
Leukocytes,Ua: NEGATIVE
Nitrite: NEGATIVE
Protein, ur: NEGATIVE mg/dL
Specific Gravity, Urine: 1.012 (ref 1.005–1.030)
pH: 5 (ref 5.0–8.0)

## 2019-02-04 LAB — COMPREHENSIVE METABOLIC PANEL
ALT: 18 U/L (ref 0–44)
AST: 18 U/L (ref 15–41)
Albumin: 3.8 g/dL (ref 3.5–5.0)
Alkaline Phosphatase: 110 U/L (ref 38–126)
Anion gap: 10 (ref 5–15)
BUN: 10 mg/dL (ref 8–23)
CO2: 26 mmol/L (ref 22–32)
Calcium: 9.2 mg/dL (ref 8.9–10.3)
Chloride: 104 mmol/L (ref 98–111)
Creatinine, Ser: 0.63 mg/dL (ref 0.44–1.00)
GFR calc Af Amer: >60 mL/min (ref >60)
GFR calc non Af Amer: >60 mL/min (ref >60)
Glucose, Bld: 96 mg/dL (ref 70–99)
Potassium: 3.5 mmol/L (ref 3.5–5.1)
Sodium: 140 mmol/L (ref 135–145)
Total Bilirubin: 0.7 mg/dL (ref 0.3–1.2)
Total Protein: 7.1 g/dL (ref 6.5–8.1)

## 2019-02-04 LAB — LIPASE, BLOOD: Lipase: 39 U/L (ref 11–51)

## 2019-02-04 MED ORDER — ONDANSETRON HCL 4 MG/2ML IJ SOLN
4.0000 mg | Freq: Once | INTRAMUSCULAR | Status: AC
  Administered 2019-02-04: 4 mg via INTRAVENOUS
  Filled 2019-02-04: qty 2

## 2019-02-04 MED ORDER — SODIUM CHLORIDE 0.9% FLUSH: 3.0000 mL | Freq: Once | INTRAVENOUS | Status: DC

## 2019-02-04 MED ORDER — METRONIDAZOLE IN NACL 5-0.79 MG/ML-% IV SOLN
500.0000 mg | Freq: Once | INTRAVENOUS | Status: AC
  Administered 2019-02-05: 500 mg via INTRAVENOUS
  Filled 2019-02-04: qty 100

## 2019-02-04 MED ORDER — PIPERACILLIN-TAZOBACTAM 3.375 G IVPB 30 MIN
3.3750 g | Freq: Once | INTRAVENOUS | Status: AC
  Administered 2019-02-05: 01:00:00 3.375 g via INTRAVENOUS
  Filled 2019-02-04: qty 50

## 2019-02-04 MED ORDER — MORPHINE SULFATE (PF) 4 MG/ML IV SOLN
4.0000 mg | Freq: Once | INTRAVENOUS | Status: AC
  Administered 2019-02-04: 4 mg via INTRAVENOUS
  Filled 2019-02-04: qty 1

## 2019-02-04 MED ORDER — SODIUM CHLORIDE 0.9 % IV SOLN
2.0000 g | Freq: Once | INTRAVENOUS | Status: AC
  Administered 2019-02-04: 2 g via INTRAVENOUS
  Filled 2019-02-04: qty 20

## 2019-02-04 MED ORDER — PIPERACILLIN-TAZOBACTAM 3.375 G IVPB
3.3750 g | Freq: Three times a day (TID) | INTRAVENOUS | Status: DC
  Administered 2019-02-05 – 2019-02-07 (×8): 3.375 g via INTRAVENOUS
  Filled 2019-02-04 (×11): qty 50

## 2019-02-04 MED ORDER — SODIUM CHLORIDE (PF) 0.9 % IJ SOLN
INTRAMUSCULAR | Status: AC
  Administered 2019-02-04: 23:00:00
  Filled 2019-02-04: qty 50

## 2019-02-04 MED ORDER — SODIUM CHLORIDE 0.9 % IV BOLUS
1000.0000 mL | Freq: Once | INTRAVENOUS | Status: AC
  Administered 2019-02-04: 1000 mL via INTRAVENOUS

## 2019-02-04 MED ORDER — IOHEXOL 300 MG/ML  SOLN
100.0000 mL | Freq: Once | INTRAMUSCULAR | Status: AC | PRN
  Administered 2019-02-04: 100 mL via INTRAVENOUS

## 2019-02-04 NOTE — ED Provider Notes (Signed)
[facility] DEPT Provider Note   CSN: [id number] Arrival date & time: 02/04/19  1731    History   Chief Complaint Chief Complaint  Patient presents with   Abdominal Pain    HPI Julie Arias is a 68 y.o. female with history of diverticulitis, GERD, hypothyroidism presenting today for left lower quadrant pain and fever.  Patient reports that pain began yesterday she describes a severe sharp aching pain to her left lower quadrant that has been constant since onset worsened with movement and palpation and without alleviating factors she reports it feels very similar to previous episodes of diverticulitis.  Patient reports fever of 101 F at home she took Tylenol prior to arrival.  No nausea vomiting or diarrhea, denies blood in the stool, denies hematuria/dysuria, cough/shortness of breath or chest pain.  She has no additional concerns today.  Patient reports that with last episode of diverticulitis that she required admission and IV antibiotics after failed outpatient treatment.     HPI  Past Medical History:  Diagnosis Date   Diverticulitis    caused "perforated colon" hospitalized- no surgery   GERD (gastroesophageal reflux disease)    Hypothyroidism    Thyroid disease     There are no active problems to display for this patient.   Past Surgical History:  Procedure Laterality Date   ABDOMINAL HYSTERECTOMY     COLONOSCOPY W/ POLYPECTOMY     COLONOSCOPY WITH PROPOFOL N/A 07/07/2015   Procedure: COLONOSCOPY WITH PROPOFOL;  Surgeon: Garlan Fair, MD;  Location: [facility] ENDOSCOPY;  Service: Endoscopy;  Laterality: N/A;     OB History   No obstetric history on file.      Home Medications    Prior to Admission medications   Medication Sig Start Date End Date Taking? Authorizing Provider  cephALEXin (KEFLEX) 500 MG capsule Take 1 capsule (500 mg total) by mouth 2 (two) times daily. 01/29/19  Yes Evelina Bucy, DPM  EUTHYROX 175  MCG tablet Take 175 mcg by mouth daily before breakfast.  11/29/18  Yes [provider]  ibuprofen (ADVIL) 200 MG tablet Take 400 mg by mouth 2 (two) times daily as needed for headache.   Yes [provider]  LORazepam (ATIVAN) 0.5 MG tablet Take 0.5 mg by mouth at bedtime as needed for anxiety or sleep.    Yes [provider]  omeprazole (PRILOSEC) 40 MG capsule Take 40 mg by mouth daily.   Yes [provider]  ondansetron (ZOFRAN) 4 MG tablet Take 1 tablet (4 mg total) by mouth every 8 (eight) hours as needed for nausea or vomiting. 12/20/18  Yes Evelina Bucy, DPM  traMADol (ULTRAM) 50 MG tablet Take 50 mg by mouth every 6 (six) hours as needed (Pain).    Yes [provider]  valACYclovir (VALTREX) 1000 MG tablet Take 1,000 mg by mouth 2 (two) times daily as needed (cold sores).  11/27/18  Yes [provider]  oxyCODONE-acetaminophen (PERCOCET) 5-325 MG tablet Take 1 tablet by mouth every 4 (four) hours as needed for severe pain. Patient not taking: Reported on 02/04/2019 12/20/18   Evelina Bucy, DPM    Family History No family history on file.  Social History Social History   Tobacco Use   Smoking status: Former Smoker    Packs/day: 1.00    Years: 25.00    Pack years: 25.00    Types: Cigarettes    Quit date: 01/03/2004    Years since quitting: 62.0  Smokeless tobacco: Never Used  Substance Use Topics   Alcohol use: No   Drug use: No     Allergies   Aleve [naproxen sodium]   Review of Systems Review of Systems  Constitutional: Positive for chills and fever.  Respiratory: Negative.  Negative for cough and shortness of breath.   Cardiovascular: Negative.  Negative for chest pain.  Gastrointestinal: Positive for abdominal pain. Negative for diarrhea, nausea and vomiting.  All other systems reviewed and are negative.  Physical Exam Updated Vital Signs BP 125/73    Pulse 87    Temp 99.1 F (37.3 C) (Oral)    Resp  15    Ht 5\' 4"  (1.626 m)    Wt 63.5 kg    SpO2 95%    BMI 24.03 kg/m   Physical Exam Constitutional:      General: She is not in acute distress.    Appearance: Normal appearance. She is well-developed. She is not ill-appearing or diaphoretic.  HENT:     Head: Normocephalic and atraumatic.     Right Ear: External ear normal.     Left Ear: External ear normal.     Nose: Nose normal.  Eyes:     General: Vision grossly intact. Gaze aligned appropriately.     Pupils: Pupils are equal, round, and reactive to light.  Neck:     Musculoskeletal: Normal range of motion.     Trachea: Trachea and phonation normal. No tracheal deviation.  Cardiovascular:     Rate and Rhythm: Normal rate and regular rhythm.     Pulses:          Dorsalis pedis pulses are 2+ on the right side and 2+ on the left side.  Pulmonary:     Effort: Pulmonary effort is normal. No respiratory distress.  Abdominal:     General: There is no distension.     Palpations: Abdomen is soft.     Tenderness: There is abdominal tenderness in the left lower quadrant. There is guarding. There is no right CVA tenderness, left CVA tenderness or rebound. Negative signs include Murphy's sign and McBurney's sign.  Genitourinary:    Comments: Deferred by patient. Musculoskeletal: Normal range of motion.  Feet:     Right foot:     Protective Sensation: 3 sites tested. 3 sites sensed.     Left foot:     Protective Sensation: 3 sites tested. 3 sites sensed.  Skin:    General: Skin is warm and dry.  Neurological:     Mental Status: She is alert.     GCS: GCS eye subscore is 4. GCS verbal subscore is 5. GCS motor subscore is 6.     Comments: Speech is clear and goal oriented, follows commands Major Cranial nerves without deficit, no facial droop Moves extremities without ataxia, coordination intact  Psychiatric:        Behavior: Behavior normal.    ED Treatments / Results  Labs (all labs ordered are listed, but only abnormal results  are displayed) Labs Reviewed  URINALYSIS, ROUTINE W REFLEX MICROSCOPIC - Abnormal; Notable for the following components:      Result Value   Hgb urine dipstick SMALL (*)    Ketones, ur 5 (*)    Bacteria, UA RARE (*)    All other components within normal limits  SARS CORONAVIRUS 2 (HOSPITAL ORDER, [facility])  LIPASE, BLOOD  COMPREHENSIVE METABOLIC PANEL  CBC WITH DIFFERENTIAL/PLATELET    EKG None  Radiology  Ct Abdomen Pelvis W Contrast  Result Date: 02/04/2019 CLINICAL DATA:  68 y/o F; left lower quadrant abdominal pain since yesterday. EXAM: CT ABDOMEN AND PELVIS WITH CONTRAST TECHNIQUE: Multidetector CT imaging of the abdomen and pelvis was performed using the standard protocol following bolus administration of intravenous contrast. CONTRAST:  173mL OMNIPAQUE IOHEXOL 300 MG/ML  SOLN COMPARISON:  10/11/2017 CT abdomen and pelvis FINDINGS: Lower chest: No acute abnormality. Hepatobiliary: Multiple small cysts in the liver. Normal gallbladder. No additional focal liver abnormality. No biliary ductal dilatation. Pancreas: Unremarkable. No pancreatic ductal dilatation or surrounding inflammatory changes. Spleen: Normal in size without focal abnormality. Adrenals/Urinary Tract: Adrenal glands are unremarkable. Kidneys are normal, without renal calculi, focal lesion, or hydronephrosis. Bladder is unremarkable. Stomach/Bowel: Small hiatal hernia. Otherwise stomach is within normal limits. 18 mm periampullary duodenal diverticulum is stable. Acute sigmoid diverticulitis, no findings of perforation or abscess. Vascular/Lymphatic: Aortic atherosclerosis. No enlarged abdominal or pelvic lymph nodes. Reproductive: Status post hysterectomy. No adnexal masses. Other: No abdominal wall hernia or abnormality. No abdominopelvic ascites. Musculoskeletal: No acute osseous abnormality identified. Mild lumbar levocurvature with apex at L1. Lumbar spondylosis with lower lumbar discogenic  degenerative changes and facet arthropathy. IMPRESSION: 1. Acute sigmoid diverticulitis. No findings of perforation or abscess. 2. Small hiatal hernia. 3. Aortic Atherosclerosis (ICD10-I70.0). Electronically Signed   By: Kristine Garbe M.D.   On: 02/04/2019 22:58    Procedures Procedures (including critical care time)  Medications Ordered in ED Medications  sodium chloride flush (NS) 0.9 % injection 3 mL (3 mLs Intravenous Not Given 02/04/19 2223)  cefTRIAXone (ROCEPHIN) 2 g in sodium chloride 0.9 % 100 mL IVPB (has no administration in time range)    And  metroNIDAZOLE (FLAGYL) IVPB 500 mg (has no administration in time range)  sodium chloride 0.9 % bolus 1,000 mL (1,000 mLs Intravenous New Bag/Given 02/04/19 2034)  ondansetron (ZOFRAN) injection 4 mg (4 mg Intravenous Given 02/04/19 2035)  morphine 4 MG/ML injection 4 mg (4 mg Intravenous Given 02/04/19 2035)  iohexol (OMNIPAQUE) 300 MG/ML solution 100 mL (100 mLs Intravenous Contrast Given 02/04/19 2240)  sodium chloride (PF) 0.9 % injection (  Given by Other 02/04/19 2309)     Initial Impression / Assessment and Plan / ED Course  I have reviewed the triage vital signs and the nursing notes.  Pertinent labs & imaging results that were available during my care of the patient were reviewed by me and considered in my medical decision making (see chart for details).    CBC within normal meds CMP normal limits Lipase within normal limits Urinalysis nonacute CT abdomen pelvis:  IMPRESSION:  1. Acute sigmoid diverticulitis. No findings of perforation or  abscess.  2. Small hiatal hernia.  3. Aortic Atherosclerosis (ICD10-I70.0).  - Patient reassessed resting comfortably and in no acute distress states improvement of symptoms following morphine and Zofran today.  Fluid bolus has been given.  Vital signs have been stable, she is without fever here in the ED but did take Tylenol prior to arrival.  She reports fever of 101 F.  I have  discussed admission versus outpatient treatment of diverticulitis, patient is very anxious about being sent home she reports that the last time she had diverticulitis that she failed outpatient treatment and required admission, this is reasonable at this time as she does have history of fever of 64 F today, discussed with Dr. Tomi Bamberger.  Patient has been started on IV Flagyl/Rocephin, consult called for admission. - Patient has been admitted to  hospitalist service for further evaluation and management.   Note: Portions of this report may have been transcribed using voice recognition software. Every effort was made to ensure accuracy; however, inadvertent computerized transcription errors may still be present. Final Clinical Impressions(s) / ED Diagnoses   Final diagnoses:  Acute diverticulitis    ED Discharge Orders    None       Gari Crown 02/04/19 2354    Dorie Rank, MD 02/05/19 1321

## 2019-02-04 NOTE — ED Triage Notes (Signed)
Pt states LLQ abd pain since yesterday. Pt has hx of diverticulitis.

## 2019-02-04 NOTE — H&P (Signed)
History and Physical    Julie Arias TSV:[id number] DOB: Feb 16, 1951 DOA: 02/04/2019  PCP: Mayra Neer, MD  Patient coming from: Home  I have personally briefly reviewed patient's old medical records in [place]  Chief Complaint: Abdominal pain  HPI: Julie Arias is a 68 y.o. female with medical history significant of hypothyroidism, GERD, anxiety, history of diverticulitis with abscess who presents with persistent left lower quadrant pain.  Patient reports has been seen by her PCP and just completed a 20-day course of ciprofloxacin and Flagyl.  Continues with significant nausea, poor appetite and significant left lower quadrant pain with inability to maintain oral intake.  Also reports temperature of 101.0 at home in which she took a Tylenol.  Patient reports previous history of diverticulitis with abscess that was treated medically in the past as well.  She also reports she underwent colonoscopy in 2016 with only findings of diverticulosis.  ED Course: Temperature 99.1, was 101.0 at home prior to taking a dose of Tylenol.  HR 87, RR 15, BP 125/73, SPO2 95% on room air.  WBC count 7.8, hemoglobin 14.3, platelets 180.  Sodium 140, potassium 3.5, chloride 104, CO2 26, BUN 10, creatinine 0.63, glucose 96.  AST 18, ALT 18, total bilirubin 0.7.  Lipase 39.  Urinalysis unrevealing.  CT abdomen/pelvis notable for acute sigmoid diverticulitis without abscess or perforation noted.  Patient was given ceftriaxone/Flagyl and 1 L normal saline bolus in the ED.  Patient referred for admission given failed outpatient antibiotic therapy for diverticulitis.  Review of Systems: As per HPI otherwise 10 point review of systems negative.   Past Medical History:  Diagnosis Date  . Diverticulitis    caused "perforated colon" hospitalized- no surgery  . GERD (gastroesophageal reflux disease)   . Hypothyroidism   . Thyroid disease     Past Surgical History:  Procedure Laterality Date  .  ABDOMINAL HYSTERECTOMY    . COLONOSCOPY W/ POLYPECTOMY    . COLONOSCOPY WITH PROPOFOL N/A 07/07/2015   Procedure: COLONOSCOPY WITH PROPOFOL;  Surgeon: Garlan Fair, MD;  Location: [facility];  Service: Endoscopy;  Laterality: N/A;     reports that she quit smoking about 15 years ago. Her smoking use included cigarettes. She has a 25.00 pack-year smoking history. She has never used smokeless tobacco. She reports that she does not drink alcohol or use drugs.  Allergies  Allergen Reactions  . Aleve [Naproxen Sodium] Rash    No family history on file.   Prior to Admission medications   Medication Sig Start Date End Date Taking? Authorizing Provider  cephALEXin (KEFLEX) 500 MG capsule Take 1 capsule (500 mg total) by mouth 2 (two) times daily. 01/29/19  Yes Evelina Bucy, DPM  EUTHYROX 175 MCG tablet Take 175 mcg by mouth daily before breakfast.  11/29/18  Yes [provider]  ibuprofen (ADVIL) 200 MG tablet Take 400 mg by mouth 2 (two) times daily as needed for headache.   Yes [provider]  LORazepam (ATIVAN) 0.5 MG tablet Take 0.5 mg by mouth at bedtime as needed for anxiety or sleep.    Yes [provider]  omeprazole (PRILOSEC) 40 MG capsule Take 40 mg by mouth daily.   Yes [provider]  ondansetron (ZOFRAN) 4 MG tablet Take 1 tablet (4 mg total) by mouth every 8 (eight) hours as needed for nausea or vomiting. 12/20/18  Yes Evelina Bucy, DPM  traMADol (ULTRAM) 50 MG tablet Take 50 mg by mouth every  6 (six) hours as needed (Pain).    Yes [provider]  valACYclovir (VALTREX) 1000 MG tablet Take 1,000 mg by mouth 2 (two) times daily as needed (cold sores).  11/27/18  Yes [provider]  oxyCODONE-acetaminophen (PERCOCET) 5-325 MG tablet Take 1 tablet by mouth every 4 (four) hours as needed for severe pain. Patient not taking: Reported on 02/04/2019 12/20/18   Evelina Bucy, DPM    Physical Exam: Vitals:   02/04/19  1744 02/04/19 2112 02/04/19 2200 02/04/19 2230  BP: (!) 176/100 130/86 125/67 125/73  Pulse: 98 88 88 87  Resp: 18 17 16 15   Temp: 99.1 F (37.3 C)     TempSrc: Oral     SpO2: 98% 100% 95% 95%  Weight: 63.5 kg     Height: 5\' 4"  (1.626 m)       Constitutional: NAD, calm, comfortable Vitals:   02/04/19 1744 02/04/19 2112 02/04/19 2200 02/04/19 2230  BP: (!) 176/100 130/86 125/67 125/73  Pulse: 98 88 88 87  Resp: 18 17 16 15   Temp: 99.1 F (37.3 C)     TempSrc: Oral     SpO2: 98% 100% 95% 95%  Weight: 63.5 kg     Height: 5\' 4"  (1.626 m)      Eyes: PERRL, lids and conjunctivae normal ENMT: Mucous membranes are dry. Posterior pharynx clear of any exudate or lesions.Normal dentition.  Neck: normal, supple, no masses, no thyromegaly Respiratory: clear to auscultation bilaterally, no wheezing, no crackles. Normal respiratory effort. No accessory muscle use.  Cardiovascular: Regular rate and rhythm, no murmurs / rubs / gallops. No extremity edema. 2+ pedal pulses. No carotid bruits.  Abdomen: Moderate left lower quadrant tenderness, no rebound/guarding/masses, no hepatosplenomegaly. Bowel sounds positive.  Musculoskeletal: no clubbing / cyanosis. No joint deformity upper and lower extremities. Good ROM, no contractures. Normal muscle tone.  Skin: no rashes, lesions, ulcers. No induration Neurologic: CN 2-12 grossly intact. Sensation intact, DTR normal. Strength 5/5 in all 4.  Psychiatric: Normal judgment and insight. Alert and oriented x 3. Normal mood.    Labs on Admission: I have personally reviewed following labs and imaging studies  CBC: Recent Labs  Lab 02/04/19 2040  WBC 7.8  NEUTROABS 5.0  HGB 14.3  HCT 44.6  MCV 92.5  PLT 703   Basic Metabolic Panel: Recent Labs  Lab 02/04/19 2040  NA 140  K 3.5  CL 104  CO2 26  GLUCOSE 96  BUN 10  CREATININE 0.63  CALCIUM 9.2   GFR: Estimated Creatinine Clearance: 60.5 mL/min (by C-G formula based on SCr of 0.63 mg/dL).  Liver Function Tests: Recent Labs  Lab 02/04/19 2040  AST 18  ALT 18  ALKPHOS 110  BILITOT 0.7  PROT 7.1  ALBUMIN 3.8   Recent Labs  Lab 02/04/19 2040  LIPASE 39   No results for input(s): AMMONIA in the last 168 hours. Coagulation Profile: No results for input(s): INR, PROTIME in the last 168 hours. Cardiac Enzymes: No results for input(s): CKTOTAL, CKMB, CKMBINDEX, TROPONINI in the last 168 hours. BNP (last 3 results) No results for input(s): PROBNP in the last 8760 hours. HbA1C: No results for input(s): HGBA1C in the last 72 hours. CBG: No results for input(s): GLUCAP in the last 168 hours. Lipid Profile: No results for input(s): CHOL, HDL, LDLCALC, TRIG, CHOLHDL, LDLDIRECT in the last 72 hours. Thyroid Function Tests: No results for input(s): TSH, T4TOTAL, FREET4, T3FREE, THYROIDAB in the last 72 hours. Anemia Panel: No  results for input(s): VITAMINB12, FOLATE, FERRITIN, TIBC, IRON, RETICCTPCT in the last 72 hours. Urine analysis:    Component Value Date/Time   COLORURINE YELLOW 02/04/2019 2040   APPEARANCEUR CLEAR 02/04/2019 2040   LABSPEC 1.012 02/04/2019 2040   PHURINE 5.0 02/04/2019 2040   GLUCOSEU NEGATIVE 02/04/2019 2040   HGBUR SMALL (A) 02/04/2019 2040   BILIRUBINUR NEGATIVE 02/04/2019 2040   KETONESUR 5 (A) 02/04/2019 2040   PROTEINUR NEGATIVE 02/04/2019 2040   UROBILINOGEN 1.0 07/07/2010 2000   NITRITE NEGATIVE 02/04/2019 2040   LEUKOCYTESUR NEGATIVE 02/04/2019 2040    Radiological Exams on Admission: Ct Abdomen Pelvis W Contrast  Result Date: 02/04/2019 CLINICAL DATA:  68 y/o F; left lower quadrant abdominal pain since yesterday. EXAM: CT ABDOMEN AND PELVIS WITH CONTRAST TECHNIQUE: Multidetector CT imaging of the abdomen and pelvis was performed using the standard protocol following bolus administration of intravenous contrast. CONTRAST:  139mL OMNIPAQUE IOHEXOL 300 MG/ML  SOLN COMPARISON:  10/11/2017 CT abdomen and pelvis FINDINGS: Lower chest: No  acute abnormality. Hepatobiliary: Multiple small cysts in the liver. Normal gallbladder. No additional focal liver abnormality. No biliary ductal dilatation. Pancreas: Unremarkable. No pancreatic ductal dilatation or surrounding inflammatory changes. Spleen: Normal in size without focal abnormality. Adrenals/Urinary Tract: Adrenal glands are unremarkable. Kidneys are normal, without renal calculi, focal lesion, or hydronephrosis. Bladder is unremarkable. Stomach/Bowel: Small hiatal hernia. Otherwise stomach is within normal limits. 18 mm periampullary duodenal diverticulum is stable. Acute sigmoid diverticulitis, no findings of perforation or abscess. Vascular/Lymphatic: Aortic atherosclerosis. No enlarged abdominal or pelvic lymph nodes. Reproductive: Status post hysterectomy. No adnexal masses. Other: No abdominal wall hernia or abnormality. No abdominopelvic ascites. Musculoskeletal: No acute osseous abnormality identified. Mild lumbar levocurvature with apex at L1. Lumbar spondylosis with lower lumbar discogenic degenerative changes and facet arthropathy. IMPRESSION: 1. Acute sigmoid diverticulitis. No findings of perforation or abscess. 2. Small hiatal hernia. 3. Aortic Atherosclerosis (ICD10-I70.0). Electronically Signed   By: Kristine Garbe M.D.   On: 02/04/2019 22:58    Assessment/Plan Principal Problem:   Acute diverticulitis Active Problems:   Hypothyroidism   GERD without esophagitis   Anxiety  Acute sigmoid diverticulitis Presenting with persistent left lower quadrant abdominal pain and fever of 101.0 at home.  History of diverticulitis with abscess.  Failed outpatient treatment with Flagyl/Cipro over the past 20 days, with now inability to tolerate oral intake.  CT abdomen/pelvis notable for acute sigmoid diverticulitis without abscess or perforation. --Start Zosyn;l --Clear liquid diet --Supportive care, antiemetics  Hypokalemia Potassium 3.5. --We will replete --repeat  electrolytes in the a.m. to include magnesium  Hypothyroidism --Check TSH --Continue home Euthyrox 136mcg po daily  GERD: Continue PPI  Anxiety: Continue lorazepam 0.5 mg p.o. nightly as needed   DVT prophylaxis: Lovenox Code Status: Full code Family Communication: None Disposition Plan: Anticipate discharge home when medically ready Consults called: none Admission status: Inpatient   Yazen Rosko J [place] DO [facility] Pager [phone]  If 7PM-7AM, please contact night-coverage [email or web address] Password [email or web address]  02/04/2019, 11:46 PM

## 2019-02-04 NOTE — Progress Notes (Signed)
Pharmacy Antibiotic Note  Julie Arias is a 68 y.o. female admitted on 02/04/2019 with Intra-abdominal infection.  Pharmacy has been consulted for zosyn dosing.  Plan: Zosyn 3.375g IV q8h (4 hour infusion).  Height: 5\' 4"  (162.6 cm) Weight: 140 lb (63.5 kg) IBW/kg (Calculated) : 54.7  Temp (24hrs), Avg:99.1 F (37.3 C), Min:99.1 F (37.3 C), Max:99.1 F (37.3 C)  Recent Labs  Lab 02/04/19 2040  WBC 7.8  CREATININE 0.63    Estimated Creatinine Clearance: 60.5 mL/min (by C-G formula based on SCr of 0.63 mg/dL).    Allergies  Allergen Reactions  . Aleve [Naproxen Sodium] Rash    Antimicrobials this admission: Zosyn 02/04/2019 >>   Dose adjustments this admission: -  Microbiology results: -  Thank you for allowing pharmacy to be a part of this patient's care.  Nani Skillern Crowford 02/04/2019 11:56 PM

## 2019-02-05 LAB — BASIC METABOLIC PANEL
Anion gap: 8 (ref 5–15)
BUN: 8 mg/dL (ref 8–23)
CO2: 24 mmol/L (ref 22–32)
Calcium: 8.6 mg/dL — ABNORMAL LOW (ref 8.9–10.3)
Chloride: 108 mmol/L (ref 98–111)
Creatinine, Ser: 0.66 mg/dL (ref 0.44–1.00)
GFR calc Af Amer: >60 mL/min (ref >60)
GFR calc non Af Amer: >60 mL/min (ref >60)
Glucose, Bld: 93 mg/dL (ref 70–99)
Potassium: 3.8 mmol/L (ref 3.5–5.1)
Sodium: 140 mmol/L (ref 135–145)

## 2019-02-05 LAB — CBC
HCT: 39.3 % (ref 36.0–46.0)
Hemoglobin: 12.9 g/dL (ref 12.0–15.0)
MCH: 30.7 pg (ref 26.0–34.0)
MCHC: 32.8 g/dL (ref 30.0–36.0)
MCV: 93.6 fL (ref 80.0–100.0)
Platelets: 143 10*3/uL — ABNORMAL LOW (ref 150–400)
RBC: 4.2 MIL/uL (ref 3.87–5.11)
RDW: 12 % (ref 11.5–15.5)
WBC: 5.3 10*3/uL (ref 4.0–10.5)
nRBC: 0 % (ref 0.0–0.2)

## 2019-02-05 LAB — T4, FREE: Free T4: 1.88 ng/dL — ABNORMAL HIGH (ref 0.61–1.12)

## 2019-02-05 LAB — HIV ANTIBODY (ROUTINE TESTING W REFLEX): HIV Screen 4th Generation wRfx: NONREACTIVE

## 2019-02-05 LAB — SARS CORONAVIRUS 2 BY RT PCR (HOSPITAL ORDER, PERFORMED IN ~~LOC~~ HOSPITAL LAB): SARS Coronavirus 2: NEGATIVE

## 2019-02-05 LAB — MAGNESIUM: Magnesium: 2 mg/dL (ref 1.7–2.4)

## 2019-02-05 LAB — TSH: TSH: 0.125 u[IU]/mL — ABNORMAL LOW (ref 0.350–4.500)

## 2019-02-05 MED ORDER — LORAZEPAM 0.5 MG PO TABS: 0.5000 mg | ORAL_TABLET | Freq: Every evening | ORAL | Status: DC | PRN

## 2019-02-05 MED ORDER — LEVOTHYROXINE SODIUM 88 MCG PO TABS
88.0000 ug | ORAL_TABLET | Freq: Every day | ORAL | Status: DC
  Administered 2019-02-06 – 2019-02-07 (×2): 88 ug via ORAL
  Filled 2019-02-05 (×2): qty 1

## 2019-02-05 MED ORDER — ONDANSETRON HCL 4 MG/2ML IJ SOLN: 4.0000 mg | Freq: Four times a day (QID) | INTRAMUSCULAR | Status: DC | PRN

## 2019-02-05 MED ORDER — ACETAMINOPHEN 650 MG RE SUPP: 650.0000 mg | Freq: Four times a day (QID) | RECTAL | Status: DC | PRN

## 2019-02-05 MED ORDER — CALCIUM CARBONATE ANTACID 500 MG PO CHEW
1.0000 | CHEWABLE_TABLET | Freq: Two times a day (BID) | ORAL | Status: DC | PRN
  Administered 2019-02-05: 200 mg via ORAL
  Filled 2019-02-05: qty 1

## 2019-02-05 MED ORDER — TRAMADOL HCL 50 MG PO TABS
50.0000 mg | ORAL_TABLET | Freq: Four times a day (QID) | ORAL | Status: DC | PRN
  Administered 2019-02-05 – 2019-02-07 (×6): 50 mg via ORAL
  Filled 2019-02-05 (×7): qty 1

## 2019-02-05 MED ORDER — POTASSIUM CHLORIDE CRYS ER 20 MEQ PO TBCR
40.0000 meq | EXTENDED_RELEASE_TABLET | Freq: Once | ORAL | Status: AC
  Administered 2019-02-05: 40 meq via ORAL
  Filled 2019-02-05: qty 2

## 2019-02-05 MED ORDER — SENNOSIDES-DOCUSATE SODIUM 8.6-50 MG PO TABS: 1.0000 | ORAL_TABLET | Freq: Every evening | ORAL | Status: DC | PRN

## 2019-02-05 MED ORDER — SODIUM CHLORIDE 0.9 % IV SOLN
INTRAVENOUS | Status: DC | PRN
  Administered 2019-02-05: 250 mL via INTRAVENOUS

## 2019-02-05 MED ORDER — ONDANSETRON HCL 4 MG PO TABS: 4.0000 mg | ORAL_TABLET | Freq: Four times a day (QID) | ORAL | Status: DC | PRN

## 2019-02-05 MED ORDER — ACETAMINOPHEN 325 MG PO TABS: 650.0000 mg | ORAL_TABLET | Freq: Four times a day (QID) | ORAL | Status: DC | PRN

## 2019-02-05 MED ORDER — ALUM & MAG HYDROXIDE-SIMETH 200-200-20 MG/5ML PO SUSP
15.0000 mL | Freq: Once | ORAL | Status: AC
  Administered 2019-02-05: 30 mL via ORAL
  Filled 2019-02-05: qty 30

## 2019-02-05 MED ORDER — BISACODYL 10 MG RE SUPP: 10.0000 mg | Freq: Every day | RECTAL | Status: DC | PRN

## 2019-02-05 MED ORDER — LEVOTHYROXINE SODIUM 25 MCG PO TABS
175.0000 ug | ORAL_TABLET | Freq: Every day | ORAL | Status: DC
  Administered 2019-02-05: 175 ug via ORAL
  Filled 2019-02-05: qty 1

## 2019-02-05 MED ORDER — ENOXAPARIN SODIUM 40 MG/0.4ML ~~LOC~~ SOLN
40.0000 mg | SUBCUTANEOUS | Status: DC
  Administered 2019-02-05 – 2019-02-07 (×3): 40 mg via SUBCUTANEOUS
  Filled 2019-02-05 (×3): qty 0.4

## 2019-02-05 MED ORDER — PANTOPRAZOLE SODIUM 40 MG PO TBEC
80.0000 mg | DELAYED_RELEASE_TABLET | Freq: Every day | ORAL | Status: DC
  Administered 2019-02-05 – 2019-02-07 (×3): 80 mg via ORAL
  Filled 2019-02-05 (×3): qty 2

## 2019-02-05 NOTE — Progress Notes (Signed)
PROGRESS NOTE    Julie Arias  NLZ:[id number] DOB: 1951/02/12 DOA: 02/04/2019 PCP: Mayra Neer, MD     Brief Narrative:  Julie Arias is a 68 y.o. female with medical history significant of hypothyroidism, GERD, anxiety, history of diverticulitis with abscess who presents with persistent left lower quadrant pain.  Patient reports has been seen by her PCP and just completed a 20-day course of ciprofloxacin and Flagyl. Patient reports previous history of diverticulitis with abscess that was treated medically in the past as well.  She also reports she underwent colonoscopy in 2016 with only findings of diverticulosis. CT abdomen/pelvis notable for acute sigmoid diverticulitis without abscess or perforation noted.    New events last 24 hours / Subjective: Continues to have LLQ pain, diarrhea without blood, but slightly improved from admission   Assessment & Plan:   Principal Problem:   Acute diverticulitis Active Problems:   Hypothyroidism   GERD without esophagitis   Anxiety   Acute sigmoid diverticulitis -Presenting with persistent left lower quadrant abdominal pain and fever of 101.0 at home.  History of diverticulitis with abscess.  Failed outpatient treatment with Flagyl/Cipro over the past 20 days.  CT abdomen/pelvis notable for acute sigmoid diverticulitis without abscess or perforation. -Zosyn -Advance to full liquid diet   Hypothyroidism -TSH 0.125, check free T4 -Continue synthroid   GERD -Continue PPI  Anxiety -Continue lorazepam 0.5 mg p.o. nightly as needed    DVT prophylaxis: Lovenox Code Status: Full Family Communication: None Disposition Plan: Pending clinical improvement in pain and diarrhea   Consultants:   None  Procedures:   None   Antimicrobials:  Anti-infectives (From admission, onward)   Start     Dose/Rate Route Frequency Ordered Stop   02/05/19 0600  piperacillin-tazobactam (ZOSYN) IVPB 3.375 g     3.375 g 12.5 mL/hr  over 240 Minutes Intravenous Every 8 hours 02/04/19 2356     02/05/19 0000  piperacillin-tazobactam (ZOSYN) IVPB 3.375 g     3.375 g 100 mL/hr over 30 Minutes Intravenous  Once 02/04/19 2356 02/05/19 0137   02/04/19 2315  cefTRIAXone (ROCEPHIN) 2 g in sodium chloride 0.9 % 100 mL IVPB     2 g 200 mL/hr over 30 Minutes Intravenous  Once 02/04/19 2310 02/05/19 0018   02/04/19 2315  metroNIDAZOLE (FLAGYL) IVPB 500 mg     500 mg 100 mL/hr over 60 Minutes Intravenous  Once 02/04/19 2310 02/05/19 0102       Objective: Vitals:   02/05/19 0030 02/05/19 0100 02/05/19 0202 02/05/19 0505  BP: (!) 104/58  118/88 105/63  Pulse: 84  79 88  Resp:  16 18 16   Temp:   98.2 F (36.8 C) 98.2 F (36.8 C)  TempSrc:   Oral Oral  SpO2: 94%  97% 92%  Weight:      Height:        Intake/Output Summary (Last 24 hours) at 02/05/2019 1200 Last data filed at 02/05/2019 0702 Gross per 24 hour  Intake 30.05 ml  Output --  Net 30.05 ml   Filed Weights   02/04/19 1744  Weight: 63.5 kg    Examination:  General exam: Appears calm and comfortable  Respiratory system: Clear to auscultation. Respiratory effort normal. Cardiovascular system: S1 & S2 heard, RRR. No JVD, murmurs, rubs, gallops or clicks. No pedal edema. Gastrointestinal system: Abdomen is nondistended, soft and TTP LLQ  Central nervous system: Alert and oriented. No focal neurological deficits. Extremities: Symmetric 5 x 5 power. Skin: No rashes,  lesions or ulcers Psychiatry: Judgement and insight appear normal. Mood & affect appropriate.   Data Reviewed: I have personally reviewed following labs and imaging studies  CBC: Recent Labs  Lab 02/04/19 2040 02/05/19 0549  WBC 7.8 5.3  NEUTROABS 5.0  --   HGB 14.3 12.9  HCT 44.6 39.3  MCV 92.5 93.6  PLT 180 751*   Basic Metabolic Panel: Recent Labs  Lab 02/04/19 2040 02/05/19 0549  NA 140 140  K 3.5 3.8  CL 104 108  CO2 26 24  GLUCOSE 96 93  BUN 10 8  CREATININE 0.63 0.66   CALCIUM 9.2 8.6*  MG  --  2.0   GFR: Estimated Creatinine Clearance: 60.5 mL/min (by C-G formula based on SCr of 0.66 mg/dL). Liver Function Tests: Recent Labs  Lab 02/04/19 2040  AST 18  ALT 18  ALKPHOS 110  BILITOT 0.7  PROT 7.1  ALBUMIN 3.8   Recent Labs  Lab 02/04/19 2040  LIPASE 39   No results for input(s): AMMONIA in the last 168 hours. Coagulation Profile: No results for input(s): INR, PROTIME in the last 168 hours. Cardiac Enzymes: No results for input(s): CKTOTAL, CKMB, CKMBINDEX, TROPONINI in the last 168 hours. BNP (last 3 results) No results for input(s): PROBNP in the last 8760 hours. HbA1C: No results for input(s): HGBA1C in the last 72 hours. CBG: No results for input(s): GLUCAP in the last 168 hours. Lipid Profile: No results for input(s): CHOL, HDL, LDLCALC, TRIG, CHOLHDL, LDLDIRECT in the last 72 hours. Thyroid Function Tests: Recent Labs    02/05/19 0549  TSH 0.125*   Anemia Panel: No results for input(s): VITAMINB12, FOLATE, FERRITIN, TIBC, IRON, RETICCTPCT in the last 72 hours. Sepsis Labs: No results for input(s): PROCALCITON, LATICACIDVEN in the last 168 hours.  Recent Results (from the past 240 hour(s))  SARS Coronavirus 2 (CEPHEID - Performed in [place] hospital lab), Hosp Order     Status: None   Collection Time: 02/04/19 11:22 PM   Specimen: Nasopharyngeal Swab  Result Value Ref Range Status   SARS Coronavirus 2 NEGATIVE NEGATIVE Final    Comment: (NOTE) If result is NEGATIVE SARS-CoV-2 target nucleic acids are NOT DETECTED. The SARS-CoV-2 RNA is generally detectable in upper and lower  respiratory specimens during the acute phase of infection. The lowest  concentration of SARS-CoV-2 viral copies this assay can detect is 250  copies / mL. A negative result does not preclude SARS-CoV-2 infection  and should not be used as the sole basis for treatment or other  patient management decisions.  A negative result may occur with   improper specimen collection / handling, submission of specimen other  than nasopharyngeal swab, presence of viral mutation(s) within the  areas targeted by this assay, and inadequate number of viral copies  (<250 copies / mL). A negative result must be combined with clinical  observations, patient history, and epidemiological information. If result is POSITIVE SARS-CoV-2 target nucleic acids are DETECTED. The SARS-CoV-2 RNA is generally detectable in upper and lower  respiratory specimens dur ing the acute phase of infection.  Positive  results are indicative of active infection with SARS-CoV-2.  Clinical  correlation with patient history and other diagnostic information is  necessary to determine patient infection status.  Positive results do  not rule out bacterial infection or co-infection with other viruses. If result is PRESUMPTIVE POSTIVE SARS-CoV-2 nucleic acids MAY BE PRESENT.   A presumptive positive result was obtained on the submitted specimen  and  confirmed on repeat testing.  While 2019 novel coronavirus  (SARS-CoV-2) nucleic acids may be present in the submitted sample  additional confirmatory testing may be necessary for epidemiological  and / or clinical management purposes  to differentiate between  SARS-CoV-2 and other Sarbecovirus currently known to infect humans.  If clinically indicated additional testing with an alternate test  methodology [phone]) is advised. The SARS-CoV-2 RNA is generally  detectable in upper and lower respiratory sp ecimens during the acute  phase of infection. The expected result is Negative. Fact Sheet for Patients:  StrictlyIdeas.no Fact Sheet for Healthcare Providers: BankingDealers.co.za This test is not yet approved or cleared by the [place] FDA and has been authorized for detection and/or diagnosis of SARS-CoV-2 by FDA under an Emergency Use Authorization (EUA).  This EUA will  remain in effect (meaning this test can be used) for the duration of the COVID-19 declaration under Section 564(b)(1) of the Act, 21 U.S.C. section 360bbb-3(b)(1), unless the authorization is terminated or revoked sooner. Performed at [facility], [place] [address] [place], [place] [address]       Radiology Studies: Ct Abdomen Pelvis W Contrast  Result Date: 02/04/2019 CLINICAL DATA:  68 y/o F; left lower quadrant abdominal pain since yesterday. EXAM: CT ABDOMEN AND PELVIS WITH CONTRAST TECHNIQUE: Multidetector CT imaging of the abdomen and pelvis was performed using the standard protocol following bolus administration of intravenous contrast. CONTRAST:  141mL OMNIPAQUE IOHEXOL 300 MG/ML  SOLN COMPARISON:  10/11/2017 CT abdomen and pelvis FINDINGS: Lower chest: No acute abnormality. Hepatobiliary: Multiple small cysts in the liver. Normal gallbladder. No additional focal liver abnormality. No biliary ductal dilatation. Pancreas: Unremarkable. No pancreatic ductal dilatation or surrounding inflammatory changes. Spleen: Normal in size without focal abnormality. Adrenals/Urinary Tract: Adrenal glands are unremarkable. Kidneys are normal, without renal calculi, focal lesion, or hydronephrosis. Bladder is unremarkable. Stomach/Bowel: Small hiatal hernia. Otherwise stomach is within normal limits. 18 mm periampullary duodenal diverticulum is stable. Acute sigmoid diverticulitis, no findings of perforation or abscess. Vascular/Lymphatic: Aortic atherosclerosis. No enlarged abdominal or pelvic lymph nodes. Reproductive: Status post hysterectomy. No adnexal masses. Other: No abdominal wall hernia or abnormality. No abdominopelvic ascites. Musculoskeletal: No acute osseous abnormality identified. Mild lumbar levocurvature with apex at L1. Lumbar spondylosis with lower lumbar discogenic degenerative changes and facet arthropathy. IMPRESSION: 1. Acute sigmoid diverticulitis. No findings of  perforation or abscess. 2. Small hiatal hernia. 3. Aortic Atherosclerosis (ICD10-I70.0). Electronically Signed   By: Kristine Garbe M.D.   On: 02/04/2019 22:58      Scheduled Meds:  enoxaparin (LOVENOX) injection  40 mg Subcutaneous Q24H   levothyroxine  175 mcg Oral Q0600   pantoprazole  80 mg Oral Daily   sodium chloride flush  3 mL Intravenous Once   Continuous Infusions:  sodium chloride Stopped (02/05/19 0701)   piperacillin-tazobactam (ZOSYN)  IV 3.375 g (02/05/19 0702)     LOS: 1 day     Time spent: 35 minutes   Dessa Phi, DO [facility] [email or web address] 02/05/2019, 12:00 PM

## 2019-02-05 NOTE — ED Notes (Signed)
ED TO INPATIENT HANDOFF REPORT  Name/Age/Gender Julie Arias 68 y.o. female  Code Status   Home/SNF/Other Home  Chief Complaint Diverticulitis  Level of Care/Admitting Diagnosis ED Disposition    ED Disposition Condition [facility] Area: [place] [[id number]]  Level of Care: Med-Surg [16]  Covid Evaluation: Asymptomatic Screening Protocol (No Symptoms)  Diagnosis: Acute diverticulitis [[id number]]  Admitting Physician: [place] ERIC J [[id number]]  Attending Physician: [place] ERIC J [[id number]]  Estimated length of stay: past midnight tomorrow  Certification:: I certify this patient will need inpatient services for at least 2 midnights  PT Class (Do Not Modify): Inpatient [101]  PT Acc Code (Do Not Modify): Private [1]       Medical History Past Medical History:  Diagnosis Date  . Diverticulitis    caused "perforated colon" hospitalized- no surgery  . GERD (gastroesophageal reflux disease)   . Hypothyroidism   . Thyroid disease     Allergies Allergies  Allergen Reactions  . Aleve [Naproxen Sodium] Rash    IV Location/Drains/Wounds Patient Lines/Drains/Airways Status   Active Line/Drains/Airways    Name:   Placement date:   Placement time:   Site:   Days:   Peripheral IV 02/04/19 Left Antecubital   02/04/19    2033    Antecubital   1   Airway   07/07/15    1039     1309          Labs/Imaging Results for orders placed or performed during the hospital encounter of 02/04/19 (from the past 48 hour(s))  Lipase, blood     Status: None   Collection Time: 02/04/19  8:40 PM  Result Value Ref Range   Lipase 39 11 - 51 U/L    Comment: Performed at [facility], [place] [address], [place], [place] [address]  Comprehensive metabolic panel     Status: None   Collection Time: 02/04/19  8:40 PM  Result Value Ref Range   Sodium 140 135 - 145 mmol/L   Potassium 3.5 3.5 - 5.1 mmol/L   Chloride 104 98 - 111 mmol/L    CO2 26 22 - 32 mmol/L   Glucose, Bld 96 70 - 99 mg/dL   BUN 10 8 - 23 mg/dL   Creatinine, Ser 0.63 0.44 - 1.00 mg/dL   Calcium 9.2 8.9 - 10.3 mg/dL   Total Protein 7.1 6.5 - 8.1 g/dL   Albumin 3.8 3.5 - 5.0 g/dL   AST 18 15 - 41 U/L   ALT 18 0 - 44 U/L   Alkaline Phosphatase 110 38 - 126 U/L   Total Bilirubin 0.7 0.3 - 1.2 mg/dL   GFR calc non Af Amer >60 >60 mL/min   GFR calc Af Amer >60 >60 mL/min   Anion gap 10 5 - 15    Comment: Performed at [facility], [place] [address], [place], [place] [address]  Urinalysis, Routine w reflex microscopic     Status: Abnormal   Collection Time: 02/04/19  8:40 PM  Result Value Ref Range   Color, Urine YELLOW YELLOW   APPearance CLEAR CLEAR   Specific Gravity, Urine 1.012 1.005 - 1.030   pH 5.0 5.0 - 8.0   Glucose, UA NEGATIVE NEGATIVE mg/dL   Hgb urine dipstick SMALL (A) NEGATIVE   Bilirubin Urine NEGATIVE NEGATIVE   Ketones, ur 5 (A) NEGATIVE mg/dL   Protein, ur NEGATIVE NEGATIVE mg/dL   Nitrite NEGATIVE NEGATIVE   Leukocytes,Ua NEGATIVE  NEGATIVE   RBC / HPF 0-5 0 - 5 RBC/hpf   WBC, UA 0-5 0 - 5 WBC/hpf   Bacteria, UA RARE (A) NONE SEEN   Mucus PRESENT     Comment: Performed at [facility], [place] [address] [place], [place] [address]  CBC with Differential     Status: None   Collection Time: 02/04/19  8:40 PM  Result Value Ref Range   WBC 7.8 4.0 - 10.5 K/uL   RBC 4.82 3.87 - 5.11 MIL/uL   Hemoglobin 14.3 12.0 - 15.0 g/dL   HCT 44.6 36.0 - 46.0 %   MCV 92.5 80.0 - 100.0 fL   MCH 29.7 26.0 - 34.0 pg   MCHC 32.1 30.0 - 36.0 g/dL   RDW 11.9 11.5 - 15.5 %   Platelets 180 150 - 400 K/uL   nRBC 0.0 0.0 - 0.2 %   Neutrophils Relative % 64 %   Neutro Abs 5.0 1.7 - 7.7 K/uL   Lymphocytes Relative 26 %   Lymphs Abs 2.0 0.7 - 4.0 K/uL   Monocytes Relative 9 %   Monocytes Absolute 0.7 0.1 - 1.0 K/uL   Eosinophils Relative 1 %   Eosinophils Absolute 0.1 0.0 - 0.5 K/uL   Basophils Relative 0 %    Basophils Absolute 0.0 0.0 - 0.1 K/uL   Immature Granulocytes 0 %   Abs Immature Granulocytes 0.02 0.00 - 0.07 K/uL    Comment: Performed at [facility], [place] [address], [place], [place] [address]   Ct Abdomen Pelvis W Contrast  Result Date: 02/04/2019 CLINICAL DATA:  68 y/o F; left lower quadrant abdominal pain since yesterday. EXAM: CT ABDOMEN AND PELVIS WITH CONTRAST TECHNIQUE: Multidetector CT imaging of the abdomen and pelvis was performed using the standard protocol following bolus administration of intravenous contrast. CONTRAST:  158mL OMNIPAQUE IOHEXOL 300 MG/ML  SOLN COMPARISON:  10/11/2017 CT abdomen and pelvis FINDINGS: Lower chest: No acute abnormality. Hepatobiliary: Multiple small cysts in the liver. Normal gallbladder. No additional focal liver abnormality. No biliary ductal dilatation. Pancreas: Unremarkable. No pancreatic ductal dilatation or surrounding inflammatory changes. Spleen: Normal in size without focal abnormality. Adrenals/Urinary Tract: Adrenal glands are unremarkable. Kidneys are normal, without renal calculi, focal lesion, or hydronephrosis. Bladder is unremarkable. Stomach/Bowel: Small hiatal hernia. Otherwise stomach is within normal limits. 18 mm periampullary duodenal diverticulum is stable. Acute sigmoid diverticulitis, no findings of perforation or abscess. Vascular/Lymphatic: Aortic atherosclerosis. No enlarged abdominal or pelvic lymph nodes. Reproductive: Status post hysterectomy. No adnexal masses. Other: No abdominal wall hernia or abnormality. No abdominopelvic ascites. Musculoskeletal: No acute osseous abnormality identified. Mild lumbar levocurvature with apex at L1. Lumbar spondylosis with lower lumbar discogenic degenerative changes and facet arthropathy. IMPRESSION: 1. Acute sigmoid diverticulitis. No findings of perforation or abscess. 2. Small hiatal hernia. 3. Aortic Atherosclerosis (ICD10-I70.0). Electronically Signed   By: Kristine Garbe M.D.   On: 02/04/2019 22:58    Pending Labs Unresulted Labs (From admission, onward)    Start     Ordered   02/04/19 2310  SARS Coronavirus 2 (CEPHEID - Performed in [place] hospital lab), Hosp Order  (Asymptomatic Patients Labs)  Once,   STAT    Question:  Rule Out  Answer:  Yes   02/04/19 2310   Signed and Held  HIV antibody (Routine Testing)  Once,   R     Signed and Held   Signed and Held  CBC  (enoxaparin (LOVENOX)    CrCl >/=  30 ml/min)  Once,   R    Comments: Baseline for enoxaparin therapy IF NOT ALREADY DRAWN.  Notify MD if PLT < 100 K.    Signed and Held   Signed and Held  Creatinine, serum  (enoxaparin (LOVENOX)    CrCl >/= 30 ml/min)  Once,   R    Comments: Baseline for enoxaparin therapy IF NOT ALREADY DRAWN.    Signed and Held   Signed and Held  Creatinine, serum  (enoxaparin (LOVENOX)    CrCl >/= 30 ml/min)  Weekly,   R    Comments: while on enoxaparin therapy    Signed and Held   Signed and Held  TSH  Once,   R     Signed and Held   Signed and Occupational hygienist morning,   R     Signed and Held   Signed and Held  CBC  Tomorrow morning,   R     Signed and Held   Signed and Held  Magnesium  Daily,   R     Signed and Held          Vitals/Pain Today's Vitals   02/04/19 2112 02/04/19 2115 02/04/19 2200 02/04/19 2230  BP: 130/86  125/67 125/73  Pulse: 88  88 87  Resp: 17  16 15   Temp:      TempSrc:      SpO2: 100%  95% 95%  Weight:      Height:      PainSc:  10-Worst pain ever      Isolation Precautions No active isolations  Medications Medications  sodium chloride flush (NS) 0.9 % injection 3 mL (3 mLs Intravenous Not Given 02/04/19 2223)  cefTRIAXone (ROCEPHIN) 2 g in sodium chloride 0.9 % 100 mL IVPB (0 g Intravenous Stopped 02/05/19 0018)    And  metroNIDAZOLE (FLAGYL) IVPB 500 mg (500 mg Intravenous New Bag/Given 02/05/19 0007)  piperacillin-tazobactam (ZOSYN) IVPB 3.375 g (has no administration in time  range)  piperacillin-tazobactam (ZOSYN) IVPB 3.375 g (has no administration in time range)  sodium chloride 0.9 % bolus 1,000 mL (0 mLs Intravenous Stopped 02/04/19 2320)  ondansetron (ZOFRAN) injection 4 mg (4 mg Intravenous Given 02/04/19 2035)  morphine 4 MG/ML injection 4 mg (4 mg Intravenous Given 02/04/19 2035)  iohexol (OMNIPAQUE) 300 MG/ML solution 100 mL (100 mLs Intravenous Contrast Given 02/04/19 2240)  sodium chloride (PF) 0.9 % injection (  Given by Other 02/04/19 2309)    Mobility walks

## 2019-02-06 ENCOUNTER — Ambulatory Visit: Payer: Medicare HMO | Admitting: Podiatry

## 2019-02-06 LAB — BASIC METABOLIC PANEL
Anion gap: 6 (ref 5–15)
BUN: 8 mg/dL (ref 8–23)
CO2: 28 mmol/L (ref 22–32)
Calcium: 9.1 mg/dL (ref 8.9–10.3)
Chloride: 106 mmol/L (ref 98–111)
Creatinine, Ser: 0.73 mg/dL (ref 0.44–1.00)
GFR calc Af Amer: >60 mL/min (ref >60)
GFR calc non Af Amer: >60 mL/min (ref >60)
Glucose, Bld: 98 mg/dL (ref 70–99)
Potassium: 4.5 mmol/L (ref 3.5–5.1)
Sodium: 140 mmol/L (ref 135–145)

## 2019-02-06 LAB — CBC
HCT: 42.1 % (ref 36.0–46.0)
Hemoglobin: 14.1 g/dL (ref 12.0–15.0)
MCH: 31 pg (ref 26.0–34.0)
MCHC: 33.5 g/dL (ref 30.0–36.0)
MCV: 92.5 fL (ref 80.0–100.0)
Platelets: 152 10*3/uL (ref 150–400)
RBC: 4.55 MIL/uL (ref 3.87–5.11)
RDW: 11.9 % (ref 11.5–15.5)
WBC: 4 10*3/uL (ref 4.0–10.5)
nRBC: 0 % (ref 0.0–0.2)

## 2019-02-06 NOTE — Progress Notes (Signed)
PROGRESS NOTE    Julie Arias  IRJ:[id number] DOB: 08-Oct-1950 DOA: 02/04/2019 PCP: Mayra Neer, MD     Brief Narrative:  Julie Arias is a 68 y.o. female with medical history significant of hypothyroidism, GERD, anxiety, history of diverticulitis with abscess who presents with persistent left lower quadrant pain.  Patient reports has been seen by her PCP and just completed a 20-day course of ciprofloxacin and Flagyl. Patient reports previous history of diverticulitis with abscess that was treated medically in the past as well.  She also reports she underwent colonoscopy in 2016 with only findings of diverticulosis. CT abdomen/pelvis notable for acute sigmoid diverticulitis without abscess or perforation noted.    New events last 24 hours / Subjective: Continues to have LLQ pain, but overall improving. No BM today.   Assessment & Plan:   Principal Problem:   Acute diverticulitis Active Problems:   Hypothyroidism   GERD without esophagitis   Anxiety   Acute sigmoid diverticulitis -Presenting with persistent left lower quadrant abdominal pain and fever of 101.0 at home.  History of diverticulitis with abscess.  Failed outpatient treatment with Flagyl/Cipro over the past 20 days.  CT abdomen/pelvis notable for acute sigmoid diverticulitis without abscess or perforation. -Zosyn -Advance to soft diet today   Hypothyroidism -TSH 0.125, free T4 1.88 -Continue synthroid - dose decreased today   GERD -Continue PPI  Anxiety -Continue lorazepam 0.5 mg p.o. nightly as needed    DVT prophylaxis: Lovenox Code Status: Full Family Communication: None Disposition Plan: Pending clinical improvement in pain, continues to have pretty significant tenderness to palpation left lower quadrant   Consultants:   None  Procedures:   None   Antimicrobials:  Anti-infectives (From admission, onward)   Start     Dose/Rate Route Frequency Ordered Stop   02/05/19 0600   piperacillin-tazobactam (ZOSYN) IVPB 3.375 g     3.375 g 12.5 mL/hr over 240 Minutes Intravenous Every 8 hours 02/04/19 2356     02/05/19 0000  piperacillin-tazobactam (ZOSYN) IVPB 3.375 g     3.375 g 100 mL/hr over 30 Minutes Intravenous  Once 02/04/19 2356 02/05/19 0137   02/04/19 2315  cefTRIAXone (ROCEPHIN) 2 g in sodium chloride 0.9 % 100 mL IVPB     2 g 200 mL/hr over 30 Minutes Intravenous  Once 02/04/19 2310 02/05/19 0018   02/04/19 2315  metroNIDAZOLE (FLAGYL) IVPB 500 mg     500 mg 100 mL/hr over 60 Minutes Intravenous  Once 02/04/19 2310 02/05/19 0102       Objective: Vitals:   02/05/19 0505 02/05/19 1300 02/05/19 2109 02/06/19 0709  BP: 105/63 109/66 123/63 125/74  Pulse: 88 72 71 72  Resp: 16 16 16 16   Temp: 98.2 F (36.8 C) 98.3 F (36.8 C) 98.2 F (36.8 C) 98.3 F (36.8 C)  TempSrc: Oral Oral Oral Oral  SpO2: 92% 94% 100% 95%  Weight:      Height:        Intake/Output Summary (Last 24 hours) at 02/06/2019 1100 Last data filed at 02/06/2019 0800 Gross per 24 hour  Intake 306.69 ml  Output --  Net 306.69 ml   Filed Weights   02/04/19 1744  Weight: 63.5 kg    Examination: General exam: Appears calm and comfortable  Respiratory system: Clear to auscultation. Respiratory effort normal. Cardiovascular system: S1 & S2 heard, RRR. No JVD, murmurs, rubs, gallops or clicks. No pedal edema. Gastrointestinal system: Abdomen is nondistended, soft and tender to palpation left lower quadrant, positive  bowel sounds Central nervous system: Alert and oriented. No focal neurological deficits. Extremities: Symmetric 5 x 5 power. Skin: No rashes, lesions or ulcers Psychiatry: Judgement and insight appear normal. Mood & affect appropriate.    Data Reviewed: I have personally reviewed following labs and imaging studies  CBC: Recent Labs  Lab 02/04/19 2040 02/05/19 0549 02/06/19 0529  WBC 7.8 5.3 4.0  NEUTROABS 5.0  --   --   HGB 14.3 12.9 14.1  HCT 44.6 39.3  42.1  MCV 92.5 93.6 92.5  PLT 180 143* 885   Basic Metabolic Panel: Recent Labs  Lab 02/04/19 2040 02/05/19 0549 02/06/19 0529  NA 140 140 140  K 3.5 3.8 4.5  CL 104 108 106  CO2 26 24 28   GLUCOSE 96 93 98  BUN 10 8 8   CREATININE 0.63 0.66 0.73  CALCIUM 9.2 8.6* 9.1  MG  --  2.0  --    GFR: Estimated Creatinine Clearance: 60.5 mL/min (by C-G formula based on SCr of 0.73 mg/dL). Liver Function Tests: Recent Labs  Lab 02/04/19 2040  AST 18  ALT 18  ALKPHOS 110  BILITOT 0.7  PROT 7.1  ALBUMIN 3.8   Recent Labs  Lab 02/04/19 2040  LIPASE 39   No results for input(s): AMMONIA in the last 168 hours. Coagulation Profile: No results for input(s): INR, PROTIME in the last 168 hours. Cardiac Enzymes: No results for input(s): CKTOTAL, CKMB, CKMBINDEX, TROPONINI in the last 168 hours. BNP (last 3 results) No results for input(s): PROBNP in the last 8760 hours. HbA1C: No results for input(s): HGBA1C in the last 72 hours. CBG: No results for input(s): GLUCAP in the last 168 hours. Lipid Profile: No results for input(s): CHOL, HDL, LDLCALC, TRIG, CHOLHDL, LDLDIRECT in the last 72 hours. Thyroid Function Tests: Recent Labs    02/05/19 0549  TSH 0.125*  FREET4 1.88*   Anemia Panel: No results for input(s): VITAMINB12, FOLATE, FERRITIN, TIBC, IRON, RETICCTPCT in the last 72 hours. Sepsis Labs: No results for input(s): PROCALCITON, LATICACIDVEN in the last 168 hours.  Recent Results (from the past 240 hour(s))  SARS Coronavirus 2 (CEPHEID - Performed in [place] hospital lab), Hosp Order     Status: None   Collection Time: 02/04/19 11:22 PM   Specimen: Nasopharyngeal Swab  Result Value Ref Range Status   SARS Coronavirus 2 NEGATIVE NEGATIVE Final    Comment: (NOTE) If result is NEGATIVE SARS-CoV-2 target nucleic acids are NOT DETECTED. The SARS-CoV-2 RNA is generally detectable in upper and lower  respiratory specimens during the acute phase of infection. The  lowest  concentration of SARS-CoV-2 viral copies this assay can detect is 250  copies / mL. A negative result does not preclude SARS-CoV-2 infection  and should not be used as the sole basis for treatment or other  patient management decisions.  A negative result may occur with  improper specimen collection / handling, submission of specimen other  than nasopharyngeal swab, presence of viral mutation(s) within the  areas targeted by this assay, and inadequate number of viral copies  (<250 copies / mL). A negative result must be combined with clinical  observations, patient history, and epidemiological information. If result is POSITIVE SARS-CoV-2 target nucleic acids are DETECTED. The SARS-CoV-2 RNA is generally detectable in upper and lower  respiratory specimens dur ing the acute phase of infection.  Positive  results are indicative of active infection with SARS-CoV-2.  Clinical  correlation with patient history and other diagnostic information  is  necessary to determine patient infection status.  Positive results do  not rule out bacterial infection or co-infection with other viruses. If result is PRESUMPTIVE POSTIVE SARS-CoV-2 nucleic acids MAY BE PRESENT.   A presumptive positive result was obtained on the submitted specimen  and confirmed on repeat testing.  While 2019 novel coronavirus  (SARS-CoV-2) nucleic acids may be present in the submitted sample  additional confirmatory testing may be necessary for epidemiological  and / or clinical management purposes  to differentiate between  SARS-CoV-2 and other Sarbecovirus currently known to infect humans.  If clinically indicated additional testing with an alternate test  methodology [phone]) is advised. The SARS-CoV-2 RNA is generally  detectable in upper and lower respiratory sp ecimens during the acute  phase of infection. The expected result is Negative. Fact Sheet for Patients:   StrictlyIdeas.no Fact Sheet for Healthcare Providers: BankingDealers.co.za This test is not yet approved or cleared by the [place] FDA and has been authorized for detection and/or diagnosis of SARS-CoV-2 by FDA under an Emergency Use Authorization (EUA).  This EUA will remain in effect (meaning this test can be used) for the duration of the COVID-19 declaration under Section 564(b)(1) of the Act, 21 U.S.C. section 360bbb-3(b)(1), unless the authorization is terminated or revoked sooner. Performed at [facility], [place] [address], [place], [place] [address]       Radiology Studies: Ct Abdomen Pelvis W Contrast  Result Date: 02/04/2019 CLINICAL DATA:  68 y/o F; left lower quadrant abdominal pain since yesterday. EXAM: CT ABDOMEN AND PELVIS WITH CONTRAST TECHNIQUE: Multidetector CT imaging of the abdomen and pelvis was performed using the standard protocol following bolus administration of intravenous contrast. CONTRAST:  144mL OMNIPAQUE IOHEXOL 300 MG/ML  SOLN COMPARISON:  10/11/2017 CT abdomen and pelvis FINDINGS: Lower chest: No acute abnormality. Hepatobiliary: Multiple small cysts in the liver. Normal gallbladder. No additional focal liver abnormality. No biliary ductal dilatation. Pancreas: Unremarkable. No pancreatic ductal dilatation or surrounding inflammatory changes. Spleen: Normal in size without focal abnormality. Adrenals/Urinary Tract: Adrenal glands are unremarkable. Kidneys are normal, without renal calculi, focal lesion, or hydronephrosis. Bladder is unremarkable. Stomach/Bowel: Small hiatal hernia. Otherwise stomach is within normal limits. 18 mm periampullary duodenal diverticulum is stable. Acute sigmoid diverticulitis, no findings of perforation or abscess. Vascular/Lymphatic: Aortic atherosclerosis. No enlarged abdominal or pelvic lymph nodes. Reproductive: Status post hysterectomy. No adnexal masses. Other:  No abdominal wall hernia or abnormality. No abdominopelvic ascites. Musculoskeletal: No acute osseous abnormality identified. Mild lumbar levocurvature with apex at L1. Lumbar spondylosis with lower lumbar discogenic degenerative changes and facet arthropathy. IMPRESSION: 1. Acute sigmoid diverticulitis. No findings of perforation or abscess. 2. Small hiatal hernia. 3. Aortic Atherosclerosis (ICD10-I70.0). Electronically Signed   By: Kristine Garbe M.D.   On: 02/04/2019 22:58      Scheduled Meds:  enoxaparin (LOVENOX) injection  40 mg Subcutaneous Q24H   levothyroxine  88 mcg Oral Q0600   pantoprazole  80 mg Oral Daily   sodium chloride flush  3 mL Intravenous Once   Continuous Infusions:  sodium chloride 10 mL/hr at 02/06/19 0610   piperacillin-tazobactam (ZOSYN)  IV 3.375 g (02/06/19 0609)     LOS: 2 days     Time spent: 25 minutes   Dessa Phi, DO [facility] [email or web address] 02/06/2019, 11:00 AM

## 2019-02-07 MED ORDER — AMOXICILLIN-POT CLAVULANATE 875-125 MG PO TABS: 1.0000 | ORAL_TABLET | Freq: Two times a day (BID) | ORAL | 0 refills | Status: AC

## 2019-02-07 MED ORDER — OXYCODONE-ACETAMINOPHEN 5-325 MG PO TABS: 1.0000 | ORAL_TABLET | ORAL | 0 refills | Status: AC | PRN

## 2019-02-07 NOTE — Progress Notes (Signed)
Patient has discharged to home on 02/07/2019. Discharge instruction including medication and appointment was given to patient. Patient has no question at this time.

## 2019-02-07 NOTE — Progress Notes (Signed)
Pharmacy Antibiotic Note  Julie Arias is a 68 y.o. female admitted on 02/04/2019 with Intra-abdominal infection.  Pharmacy has been consulted for zosyn dosing.  Acute diverticulitis, failed outpt Cipro/Flagyl Afeb, WBC wnl, SCr wnl  Plan: Zosyn 3.375g IV q8h (4 hour infusion).  7/7 to soft diet  Height: 5\' 4"  (162.6 cm) Weight: 140 lb (63.5 kg) IBW/kg (Calculated) : 54.7  Temp (24hrs), Avg:98.1 F (36.7 C), Min:98 F (36.7 C), Max:98.3 F (36.8 C)  Recent Labs  Lab 02/04/19 2040 02/05/19 0549 02/06/19 0529  WBC 7.8 5.3 4.0  CREATININE 0.63 0.66 0.73    Estimated Creatinine Clearance: 60.5 mL/min (by C-G formula based on SCr of 0.73 mg/dL).    Allergies  Allergen Reactions  . Aleve [Naproxen Sodium] Rash    Antimicrobials this admission: Zosyn 02/07/2019 >>   Dose adjustments this admission:  Microbiology results:   Thank you for allowing pharmacy to be a part of this patient's care.  Minda Ditto 02/07/2019 12:09 PM

## 2019-02-07 NOTE — Care Management Important Message (Signed)
Important Message  Patient Details IM Letter given to Shari Heritage SW to present to the Patient Name: Julie Arias MRN: [id number] Date of Birth: 11-25-1950   Medicare Important Message Given:  Yes     Kerin Salen 02/07/2019, 10:52 AM

## 2019-02-07 NOTE — Discharge Summary (Signed)
Physician Discharge Summary  Julie Arias GXQ:[id number] DOB: 1951-07-01 DOA: 02/04/2019  PCP: Mayra Neer, MD  Admit date: 02/04/2019 Discharge date: 02/07/2019  Admitted From: home  Disposition:  Home   Recommendations for Outpatient Follow-up:  1. Follow up with PCP in 1-2 weeks  Home Health:NA  Equipment/Devices:NA   Discharge Condition:stable   CODE STATUS:stable  Diet recommendation: soft diet,   Discharge Summary:  68 year old female with history of hypothyroidism, GERD, anxiety history of recurrent diverticulitis, diverticulitis with abscess and perforation in 2016, 3 more episodes those were treated with oral antibiotics in the past presented to the emergency room with ongoing left lower quadrant abdominal pain after completing 20-day course of ciprofloxacin and Flagyl.  A CT scan showed acute sigmoid diverticulitis without abscess or perforation.  Due to significant pain and failure of oral antibiotics, she was admitted to the hospital and treated with IV Zosyn.  Remained stable with clinical improvement.  Previous colonoscopy with diverticulosis but no other findings.  Patient is medically stable now, she is able to do soft diet.  She has occasional diarrhea without complications. This is her fourth episode of diverticulitis with history of perforation and abscess.  Patient will probably benefit with elective colectomy.  Will discharge patient with 10 more days of Augmentin and referred to colorectal surgery to evaluate.  All other medical conditions remained stable and she will continue home medications.  Discharge Diagnoses:  Principal Problem:   Acute diverticulitis Active Problems:   Hypothyroidism   GERD without esophagitis   Anxiety    Discharge Instructions  Discharge Instructions    Ambulatory referral to Colorectal Surgery   Complete by: As directed    Recurrent diverticulitis   Call MD for:  persistant nausea and vomiting   Complete by: As directed     Call MD for:  severe uncontrolled pain   Complete by: As directed    Diet - low sodium heart healthy   Complete by: As directed    Discharge instructions   Complete by: As directed    Soft diet until you fell comfortable.   Increase activity slowly   Complete by: As directed      Allergies as of 02/07/2019      Reactions   Aleve [naproxen Sodium] Rash      Medication List    STOP taking these medications   cephALEXin 500 MG capsule Commonly known as: KEFLEX   traMADol 50 MG tablet Commonly known as: ULTRAM     TAKE these medications   amoxicillin-clavulanate 875-125 MG tablet Commonly known as: Augmentin Take 1 tablet by mouth 2 (two) times daily for 10 days. Notes to patient: 02/08/2019   Euthyrox 175 MCG tablet Generic drug: levothyroxine Take 175 mcg by mouth daily before breakfast. Notes to patient: 02/08/2019   ibuprofen 200 MG tablet Commonly known as: ADVIL Take 400 mg by mouth 2 (two) times daily as needed for headache.   LORazepam 0.5 MG tablet Commonly known as: ATIVAN Take 0.5 mg by mouth at bedtime as needed for anxiety or sleep.   omeprazole 40 MG capsule Commonly known as: PRILOSEC Take 40 mg by mouth daily. Notes to patient: 02/08/2019   ondansetron 4 MG tablet Commonly known as: Zofran Take 1 tablet (4 mg total) by mouth every 8 (eight) hours as needed for nausea or vomiting.   oxyCODONE-acetaminophen 5-325 MG tablet Commonly known as: Percocet Take 1 tablet by mouth every 4 (four) hours as needed for up to 3 days for severe  pain.   valACYclovir 1000 MG tablet Commonly known as: VALTREX Take 1,000 mg by mouth 2 (two) times daily as needed (cold sores).       Allergies  Allergen Reactions  . Aleve [Naproxen Sodium] Rash    Consultations:  None    Procedures/Studies: Ct Abdomen Pelvis W Contrast  Result Date: 02/04/2019 CLINICAL DATA:  68 y/o F; left lower quadrant abdominal pain since yesterday. EXAM: CT ABDOMEN AND PELVIS  WITH CONTRAST TECHNIQUE: Multidetector CT imaging of the abdomen and pelvis was performed using the standard protocol following bolus administration of intravenous contrast. CONTRAST:  127mL OMNIPAQUE IOHEXOL 300 MG/ML  SOLN COMPARISON:  10/11/2017 CT abdomen and pelvis FINDINGS: Lower chest: No acute abnormality. Hepatobiliary: Multiple small cysts in the liver. Normal gallbladder. No additional focal liver abnormality. No biliary ductal dilatation. Pancreas: Unremarkable. No pancreatic ductal dilatation or surrounding inflammatory changes. Spleen: Normal in size without focal abnormality. Adrenals/Urinary Tract: Adrenal glands are unremarkable. Kidneys are normal, without renal calculi, focal lesion, or hydronephrosis. Bladder is unremarkable. Stomach/Bowel: Small hiatal hernia. Otherwise stomach is within normal limits. 18 mm periampullary duodenal diverticulum is stable. Acute sigmoid diverticulitis, no findings of perforation or abscess. Vascular/Lymphatic: Aortic atherosclerosis. No enlarged abdominal or pelvic lymph nodes. Reproductive: Status post hysterectomy. No adnexal masses. Other: No abdominal wall hernia or abnormality. No abdominopelvic ascites. Musculoskeletal: No acute osseous abnormality identified. Mild lumbar levocurvature with apex at L1. Lumbar spondylosis with lower lumbar discogenic degenerative changes and facet arthropathy. IMPRESSION: 1. Acute sigmoid diverticulitis. No findings of perforation or abscess. 2. Small hiatal hernia. 3. Aortic Atherosclerosis (ICD10-I70.0). Electronically Signed   By: Kristine Garbe M.D.   On: 02/04/2019 22:58     Subjective: Patient seen and examined on the day of discharge.  She had few loose bowel movement overnight.  Her pain is a still present but it is mild and much improved than before.  She wanted to go home because she has a lot of issues to take care.   Discharge Exam: Vitals:   02/07/19 1454 02/07/19 1610  BP: (!) 141/79    Pulse: 77   Resp: 20   Temp: 97.6 F (36.4 C)   SpO2:  97%   Vitals:   02/07/19 0544 02/07/19 1354 02/07/19 1454 02/07/19 1610  BP: 132/83  (!) 141/79   Pulse: 73  77   Resp: 20  20   Temp: 98 F (36.7 C)  97.6 F (36.4 C)   TempSrc: Oral  Oral   SpO2: 97% 93%  97%  Weight:      Height:        General: Pt is alert, awake, not in acute distress Cardiovascular: RRR, S1/S2 +, no rubs, no gallops Respiratory: CTA bilaterally, no wheezing, no rhonchi Abdominal: Soft, mild tenderness left lateral quadrant with no rigidity or guarding,  ND, bowel sounds + Extremities: no edema, no cyanosis    The results of significant diagnostics from this hospitalization (including imaging, microbiology, ancillary and laboratory) are listed below for reference.     Microbiology: Recent Results (from the past 240 hour(s))  SARS Coronavirus 2 (CEPHEID - Performed in [place] hospital lab), Hosp Order     Status: None   Collection Time: 02/04/19 11:22 PM   Specimen: Nasopharyngeal Swab  Result Value Ref Range Status   SARS Coronavirus 2 NEGATIVE NEGATIVE Final    Comment: (NOTE) If result is NEGATIVE SARS-CoV-2 target nucleic acids are NOT DETECTED. The SARS-CoV-2 RNA is generally detectable in upper and lower  respiratory specimens during the acute phase of infection. The lowest  concentration of SARS-CoV-2 viral copies this assay can detect is 250  copies / mL. A negative result does not preclude SARS-CoV-2 infection  and should not be used as the sole basis for treatment or other  patient management decisions.  A negative result may occur with  improper specimen collection / handling, submission of specimen other  than nasopharyngeal swab, presence of viral mutation(s) within the  areas targeted by this assay, and inadequate number of viral copies  (<250 copies / mL). A negative result must be combined with clinical  observations, patient history, and epidemiological  information. If result is POSITIVE SARS-CoV-2 target nucleic acids are DETECTED. The SARS-CoV-2 RNA is generally detectable in upper and lower  respiratory specimens dur ing the acute phase of infection.  Positive  results are indicative of active infection with SARS-CoV-2.  Clinical  correlation with patient history and other diagnostic information is  necessary to determine patient infection status.  Positive results do  not rule out bacterial infection or co-infection with other viruses. If result is PRESUMPTIVE POSTIVE SARS-CoV-2 nucleic acids MAY BE PRESENT.   A presumptive positive result was obtained on the submitted specimen  and confirmed on repeat testing.  While 2019 novel coronavirus  (SARS-CoV-2) nucleic acids may be present in the submitted sample  additional confirmatory testing may be necessary for epidemiological  and / or clinical management purposes  to differentiate between  SARS-CoV-2 and other Sarbecovirus currently known to infect humans.  If clinically indicated additional testing with an alternate test  methodology [phone]) is advised. The SARS-CoV-2 RNA is generally  detectable in upper and lower respiratory sp ecimens during the acute  phase of infection. The expected result is Negative. Fact Sheet for Patients:  StrictlyIdeas.no Fact Sheet for Healthcare Providers: BankingDealers.co.za This test is not yet approved or cleared by the [place] FDA and has been authorized for detection and/or diagnosis of SARS-CoV-2 by FDA under an Emergency Use Authorization (EUA).  This EUA will remain in effect (meaning this test can be used) for the duration of the COVID-19 declaration under Section 564(b)(1) of the Act, 21 U.S.C. section 360bbb-3(b)(1), unless the authorization is terminated or revoked sooner. Performed at [facility], [place] [address] [place], [place] [address]      Labs: BNP  (last 3 results) No results for input(s): BNP in the last 8760 hours. Basic Metabolic Panel: Recent Labs  Lab 02/04/19 2040 02/05/19 0549 02/06/19 0529  NA 140 140 140  K 3.5 3.8 4.5  CL 104 108 106  CO2 26 24 28   GLUCOSE 96 93 98  BUN 10 8 8   CREATININE 0.63 0.66 0.73  CALCIUM 9.2 8.6* 9.1  MG  --  2.0  --    Liver Function Tests: Recent Labs  Lab 02/04/19 2040  AST 18  ALT 18  ALKPHOS 110  BILITOT 0.7  PROT 7.1  ALBUMIN 3.8   Recent Labs  Lab 02/04/19 2040  LIPASE 39   No results for input(s): AMMONIA in the last 168 hours. CBC: Recent Labs  Lab 02/04/19 2040 02/05/19 0549 02/06/19 0529  WBC 7.8 5.3 4.0  NEUTROABS 5.0  --   --   HGB 14.3 12.9 14.1  HCT 44.6 39.3 42.1  MCV 92.5 93.6 92.5  PLT 180 143* 152   Cardiac Enzymes: No results for input(s): CKTOTAL, CKMB, CKMBINDEX, TROPONINI in the last 168 hours. BNP: Invalid input(s): POCBNP CBG: No  results for input(s): GLUCAP in the last 168 hours. D-Dimer No results for input(s): DDIMER in the last 72 hours. Hgb A1c No results for input(s): HGBA1C in the last 72 hours. Lipid Profile No results for input(s): CHOL, HDL, LDLCALC, TRIG, CHOLHDL, LDLDIRECT in the last 72 hours. Thyroid function studies Recent Labs    02/05/19 0549  TSH 0.125*   Anemia work up No results for input(s): VITAMINB12, FOLATE, FERRITIN, TIBC, IRON, RETICCTPCT in the last 72 hours. Urinalysis    Component Value Date/Time   COLORURINE YELLOW 02/04/2019 2040   APPEARANCEUR CLEAR 02/04/2019 2040   LABSPEC 1.012 02/04/2019 2040   PHURINE 5.0 02/04/2019 2040   GLUCOSEU NEGATIVE 02/04/2019 2040   HGBUR SMALL (A) 02/04/2019 2040   BILIRUBINUR NEGATIVE 02/04/2019 2040   KETONESUR 5 (A) 02/04/2019 2040   PROTEINUR NEGATIVE 02/04/2019 2040   UROBILINOGEN 1.0 07/07/2010 2000   NITRITE NEGATIVE 02/04/2019 2040   LEUKOCYTESUR NEGATIVE 02/04/2019 2040   Sepsis Labs Invalid input(s): PROCALCITONIN,  WBC,   LACTICIDVEN Microbiology Recent Results (from the past 240 hour(s))  SARS Coronavirus 2 (CEPHEID - Performed in [place] hospital lab), Hosp Order     Status: None   Collection Time: 02/04/19 11:22 PM   Specimen: Nasopharyngeal Swab  Result Value Ref Range Status   SARS Coronavirus 2 NEGATIVE NEGATIVE Final    Comment: (NOTE) If result is NEGATIVE SARS-CoV-2 target nucleic acids are NOT DETECTED. The SARS-CoV-2 RNA is generally detectable in upper and lower  respiratory specimens during the acute phase of infection. The lowest  concentration of SARS-CoV-2 viral copies this assay can detect is 250  copies / mL. A negative result does not preclude SARS-CoV-2 infection  and should not be used as the sole basis for treatment or other  patient management decisions.  A negative result may occur with  improper specimen collection / handling, submission of specimen other  than nasopharyngeal swab, presence of viral mutation(s) within the  areas targeted by this assay, and inadequate number of viral copies  (<250 copies / mL). A negative result must be combined with clinical  observations, patient history, and epidemiological information. If result is POSITIVE SARS-CoV-2 target nucleic acids are DETECTED. The SARS-CoV-2 RNA is generally detectable in upper and lower  respiratory specimens dur ing the acute phase of infection.  Positive  results are indicative of active infection with SARS-CoV-2.  Clinical  correlation with patient history and other diagnostic information is  necessary to determine patient infection status.  Positive results do  not rule out bacterial infection or co-infection with other viruses. If result is PRESUMPTIVE POSTIVE SARS-CoV-2 nucleic acids MAY BE PRESENT.   A presumptive positive result was obtained on the submitted specimen  and confirmed on repeat testing.  While 2019 novel coronavirus  (SARS-CoV-2) nucleic acids may be present in the submitted sample   additional confirmatory testing may be necessary for epidemiological  and / or clinical management purposes  to differentiate between  SARS-CoV-2 and other Sarbecovirus currently known to infect humans.  If clinically indicated additional testing with an alternate test  methodology [phone]) is advised. The SARS-CoV-2 RNA is generally  detectable in upper and lower respiratory sp ecimens during the acute  phase of infection. The expected result is Negative. Fact Sheet for Patients:  StrictlyIdeas.no Fact Sheet for Healthcare Providers: BankingDealers.co.za This test is not yet approved or cleared by the [place] FDA and has been authorized for detection and/or diagnosis of SARS-CoV-2 by FDA under an Emergency Use  Authorization (EUA).  This EUA will remain in effect (meaning this test can be used) for the duration of the COVID-19 declaration under Section 564(b)(1) of the Act, 21 U.S.C. section 360bbb-3(b)(1), unless the authorization is terminated or revoked sooner. Performed at [facility], [place] [address], [place], [place] [address]      Time coordinating discharge:25   minutes  SIGNED:   Barb Merino, MD  [facility] 02/07/2019, 4:18 PM Pager [phone]  If 7PM-7AM, please contact night-coverage [email or web address] Password [email or web address]

## 2019-02-13 ENCOUNTER — Encounter: Payer: Medicare HMO | Admitting: Podiatry

## 2019-02-14 ENCOUNTER — Other Ambulatory Visit: Payer: Self-pay

## 2019-02-14 ENCOUNTER — Encounter: Payer: Self-pay | Admitting: Surgery

## 2019-02-14 ENCOUNTER — Other Ambulatory Visit
Admission: RE | Admit: 2019-02-14 | Discharge: 2019-02-14 | Disposition: A | Discharge status: Home / Self Care | Payer: Medicare HMO | Attending: Surgery | Admitting: Surgery

## 2019-02-14 ENCOUNTER — Ambulatory Visit: Payer: Medicare HMO | Admitting: Surgery

## 2019-02-14 VITALS — BP 152/87 | HR 76 | Temp 98.3°F | Resp 13 | Ht 64.0 in | Wt 143.8 lb

## 2019-02-14 DIAGNOSIS — Z7189 Other specified counseling: Secondary | ICD-10-CM | POA: Diagnosis not present

## 2019-02-14 DIAGNOSIS — D689 Coagulation defect, unspecified: Secondary | ICD-10-CM | POA: Diagnosis not present

## 2019-02-14 DIAGNOSIS — E039 Hypothyroidism, unspecified: Secondary | ICD-10-CM | POA: Diagnosis not present

## 2019-02-14 DIAGNOSIS — B353 Tinea pedis: Secondary | ICD-10-CM | POA: Diagnosis not present

## 2019-02-14 DIAGNOSIS — R197 Diarrhea, unspecified: Secondary | ICD-10-CM

## 2019-02-14 DIAGNOSIS — K5792 Diverticulitis of intestine, part unspecified, without perforation or abscess without bleeding: Secondary | ICD-10-CM | POA: Diagnosis not present

## 2019-02-14 DIAGNOSIS — I10 Essential (primary) hypertension: Secondary | ICD-10-CM | POA: Diagnosis not present

## 2019-02-14 DIAGNOSIS — E7800 Pure hypercholesterolemia, unspecified: Secondary | ICD-10-CM | POA: Diagnosis not present

## 2019-02-14 NOTE — Patient Instructions (Signed)
You will have a test for C-Diff completed. Go to [facility], [place] entrance and let them know you have lab work that needs done. They will give you the instructions for this test.   You will follow up in 4 weeks.

## 2019-02-14 NOTE — Progress Notes (Signed)
Patient ID: Julie Arias, female   DOB: [birth year], 68 y.o.   MRN: [id number]  HPI Julie Arias is a 68 y.o. female seen for recurrent episode of diverticulitis.  More recently presented to the ER last week with another attack.  Patient reports that since she has been taking the antibiotic her pain is improved.  She reports that she had pain in the left lower quadrant but it was sharp intermittent and moderate in intensity.  She denies any fevers any chills.  Of note she has had at least 8 episodes of diverticulitis 1 requiring hospitalization and 2 with microperforations.  She did have family history significant for mother with colon polyps.  No family history of colorectal cancer.  She is able to perform more than 4 METS of activity without any shortness of breath or chest pain.  She is only had an abdominal hysterectomy.  She is currently finishing up Augmentin that was prescribed in the emergency room. Does report watery bowel movement about 3 a day.  HPI  Past Medical History:  Diagnosis Date  . Diverticulitis    caused "perforated colon" hospitalized- no surgery  . GERD (gastroesophageal reflux disease)   . Hypothyroidism   . Thyroid disease     Past Surgical History:  Procedure Laterality Date  . ABDOMINAL HYSTERECTOMY    . COLONOSCOPY W/ POLYPECTOMY    . COLONOSCOPY WITH PROPOFOL N/A 07/07/2015   Procedure: COLONOSCOPY WITH PROPOFOL;  Surgeon: Garlan Fair, MD;  Location: [facility];  Service: Endoscopy;  Laterality: N/A;  . GANGLION CYST EXCISION  12/2018    Family History  Problem Relation Age of Onset  . Lung disease Father   . Lung cancer Brother     Social History Social History   Tobacco Use  . Smoking status: Former Smoker    Packs/day: 1.00    Years: 25.00    Pack years: 25.00    Types: Cigarettes    Quit date: 01/03/2004    Years since quitting: 15.1  . Smokeless tobacco: Never Used  Substance Use Topics  . Alcohol use: No  . Drug use: No     Allergies  Allergen Reactions  . Aleve [Naproxen Sodium] Rash    Current Outpatient Medications  Medication Sig Dispense Refill  . EUTHYROX 175 MCG tablet Take 175 mcg by mouth daily before breakfast.     . ibuprofen (ADVIL) 200 MG tablet Take 400 mg by mouth 2 (two) times daily as needed for headache.    Marland Kitchen LORazepam (ATIVAN) 0.5 MG tablet Take 0.5 mg by mouth at bedtime as needed for anxiety or sleep.     Marland Kitchen omeprazole (PRILOSEC) 40 MG capsule Take 40 mg by mouth daily.    . ondansetron (ZOFRAN) 4 MG tablet Take 1 tablet (4 mg total) by mouth every 8 (eight) hours as needed for nausea or vomiting. 20 tablet 0  . traMADol (ULTRAM) 50 MG tablet Take 50 mg by mouth every 6 (six) hours as needed.    . valACYclovir (VALTREX) 1000 MG tablet Take 1,000 mg by mouth 2 (two) times daily as needed (cold sores).     Marland Kitchen amoxicillin-clavulanate (AUGMENTIN) 875-125 MG tablet Take 1 tablet by mouth 2 (two) times daily for 10 days. 20 tablet 0   No current facility-administered medications for this visit.      Review of Systems Full ROS  was asked and was negative except for the information on the HPI  Physical Exam Blood pressure (!) 152/87,  pulse 76, temperature 98.3 F (36.8 C), resp. rate 13, height 5\' 4"  (1.626 m), weight 143 lb 12.8 oz (65.2 kg), SpO2 97 %. CONSTITUTIONAL: NAD EYES: Pupils are equal, round, and reactive to light, Sclera are non-icteric. EARS, NOSE, MOUTH AND THROAT: The oropharynx is clear. The oral mucosa is pink and moist. Hearing is intact to voice. LYMPH NODES:  Lymph nodes in the neck are normal. RESPIRATORY:  Lungs are clear. There is normal respiratory effort, with equal breath sounds bilaterally, and without pathologic use of accessory muscles. CARDIOVASCULAR: Heart is regular without murmurs, gallops, or rubs. GI: The abdomen is soft, nontender, and nondistended. There are no palpable masses. There is no hepatosplenomegaly. There are normal bowel sounds in all  quadrants. GU: Rectal deferred.   MUSCULOSKELETAL: Normal muscle strength and tone. No cyanosis or edema.   SKIN: Turgor is good and there are no pathologic skin lesions or ulcers. NEUROLOGIC: Motor and sensation is grossly normal. Cranial nerves are grossly intact. PSYCH:  Oriented to person, place and time. Affect is normal.  Data Reviewed  I have personally reviewed the patient's imaging, laboratory findings and medical records.    Assessment/Plan 68 year old female with recurrent episodes of diverticulitis.  At least 8 of them 2 of them with microperforation and 1 of them requiring hospitalization. Now comes with a recent episode that seems to be improving.  Due to persistent diarrhea will order a C. difficile.  She is to complain antibiotic therapy.  I had an extensive discussion with her regarding her disease process.  Given the fact that she keeps having recurrent episodes and requiring antibiotics I do recommend elective laparoscopic sigmoid colectomy.  We talked about quality of life and return to work.  Procedure discussed with the patient and she understands.  Also had a discussion regarding her colonoscopy.  She did have one 4 years ago was normal.  She was due for another colonoscopy in another 6 years.  I will have her come back in my office in about 4 weeks once she subsides her current episodes and will talk some more about probably schedule her for elective laparoscopic sigmoid colectomy. Extensive counseling provided   Caroleen Hamman, MD FACS General Surgeon 02/14/2019, 11:56 AM

## 2019-02-19 ENCOUNTER — Encounter: Payer: Medicare HMO | Admitting: Podiatry

## 2019-02-27 ENCOUNTER — Encounter: Payer: Self-pay | Admitting: Podiatry

## 2019-02-27 ENCOUNTER — Other Ambulatory Visit: Payer: Self-pay

## 2019-02-27 ENCOUNTER — Ambulatory Visit (INDEPENDENT_AMBULATORY_CARE_PROVIDER_SITE_OTHER): Payer: Medicare HMO | Admitting: Podiatry

## 2019-02-27 VITALS — Temp 95.8°F | Resp 16

## 2019-02-27 DIAGNOSIS — Z9889 Other specified postprocedural states: Secondary | ICD-10-CM

## 2019-02-27 DIAGNOSIS — M67471 Ganglion, right ankle and foot: Secondary | ICD-10-CM

## 2019-02-27 NOTE — Progress Notes (Signed)
  Subjective:  Patient ID: Julie Arias, female    DOB: 26-Jan-1951,  MRN: [id number]  Chief Complaint  Patient presents with  . Routine Post Op    Pt. states," it's doing good, but therer's one spot at the top that it's very tender, it feels like a knot of a stich in ther." Tx: abx cream -pt dneies swelling    DOS: 12/20/2018 Procedure: Excision of ganglion right foot  68 y.o. female returns for post-op check. Hx as above. Review of Systems: Negative except as noted in the HPI. Denies N/V/F/Ch.  Past Medical History:  Diagnosis Date  . Diverticulitis    caused "perforated colon" hospitalized- no surgery  . GERD (gastroesophageal reflux disease)   . Hypothyroidism   . Thyroid disease     Current Outpatient Medications:  .  EUTHYROX 175 MCG tablet, Take 175 mcg by mouth daily before breakfast. , Disp: , Rfl:  .  ibuprofen (ADVIL) 200 MG tablet, Take 400 mg by mouth 2 (two) times daily as needed for headache., Disp: , Rfl:  .  LORazepam (ATIVAN) 0.5 MG tablet, Take 0.5 mg by mouth at bedtime as needed for anxiety or sleep. , Disp: , Rfl:  .  omeprazole (PRILOSEC) 40 MG capsule, Take 40 mg by mouth daily., Disp: , Rfl:  .  ondansetron (ZOFRAN) 4 MG tablet, Take 1 tablet (4 mg total) by mouth every 8 (eight) hours as needed for nausea or vomiting., Disp: 20 tablet, Rfl: 0 .  traMADol (ULTRAM) 50 MG tablet, Take 50 mg by mouth every 6 (six) hours as needed., Disp: , Rfl:  .  valACYclovir (VALTREX) 1000 MG tablet, Take 1,000 mg by mouth 2 (two) times daily as needed (cold sores). , Disp: , Rfl:   Social History   Tobacco Use  Smoking Status Former Smoker  . Packs/day: 1.00  . Years: 25.00  . Pack years: 25.00  . Types: Cigarettes  . Quit date: 01/03/2004  . Years since quitting: 15.1  Smokeless Tobacco Never Used    Allergies  Allergen Reactions  . Aleve [Naproxen Sodium] Rash   Objective:   Vitals:   02/27/19 1543  Resp: 16  Temp: (!) 95.8 F (35.4 C)   There is no  height or weight on file to calculate BMI. Constitutional Well developed. Well nourished.  Vascular Foot warm and well perfused. Capillary refill normal to all digits.   Neurologic Normal speech. Oriented to person, place, and time. Epicritic sensation to light touch grossly present bilaterally.  Dermatologic Surgical wound right foot appears essentially healed but with area of stitch rejection.   Orthopedic: No tenderness noted to palpation noted about the surgical site.   Radiographs: None Assessment:   1. Ganglion cyst of right foot   2. Post-operative state    Plan:  Patient was evaluated and treated and all questions answered.  S/p foot surgery right -Again small stitch knot removed. No pain no sign of recurrence of cyst. F/u as needed.  No follow-ups on file.

## 2019-03-14 ENCOUNTER — Encounter: Payer: Self-pay | Admitting: Surgery

## 2019-03-14 ENCOUNTER — Other Ambulatory Visit: Payer: Self-pay

## 2019-03-14 ENCOUNTER — Ambulatory Visit: Payer: Medicare HMO | Admitting: Surgery

## 2019-03-14 VITALS — BP 124/81 | HR 75 | Temp 97.9°F | Ht 64.0 in | Wt 145.4 lb

## 2019-03-14 DIAGNOSIS — K5732 Diverticulitis of large intestine without perforation or abscess without bleeding: Secondary | ICD-10-CM | POA: Diagnosis not present

## 2019-03-14 DIAGNOSIS — R197 Diarrhea, unspecified: Secondary | ICD-10-CM | POA: Diagnosis not present

## 2019-03-14 NOTE — Patient Instructions (Addendum)
The patient is aware to call back for any questions or new concerns. Dr Vicente Males for colonoscopy then follow up with Dr Dahlia Byes to schedule surgery

## 2019-03-15 NOTE — Progress Notes (Signed)
Outpatient Surgical Follow Up  03/15/2019  Julie Arias is an 68 y.o. female.   Chief Complaint  Patient presents with  . Follow-up    4 week f/u Acute Diverticulitis    HPI: Julie Arias is a 68 y.o. female seen for recurrent episode of diverticulitis.  More recently presented to the ER last week with another attack.  Patient reports that since she has been taking the antibiotic her pain is improved.  She reports that she had pain in the left lower quadrant but it was sharp intermittent and moderate in intensity.  She denies any fevers any chills.  Of note she has had at least 8 episodes of diverticulitis 1 requiring hospitalization and 2 with microperforations.  .  No family history of colorectal cancer. Her only abdominal operation is an abdominal hysterectomy.  I Did personally review her CT from July this year showing evidence of diverticulitis without perforation or abscess She did have diarrhea at some point in time and we tested her for C. difficile which was negative.  She completed the complete course of antibiotics and feeling much better.  She is taking p.o. well.  She denies any fevers or chills.  No abdominal pain  Past Medical History:  Diagnosis Date  . Diverticulitis    caused "perforated colon" hospitalized- no surgery  . GERD (gastroesophageal reflux disease)   . Hypothyroidism   . Thyroid disease     Past Surgical History:  Procedure Laterality Date  . ABDOMINAL HYSTERECTOMY    . COLONOSCOPY W/ POLYPECTOMY    . COLONOSCOPY WITH PROPOFOL N/A 07/07/2015   Procedure: COLONOSCOPY WITH PROPOFOL;  Surgeon: Garlan Fair, MD;  Location: [facility];  Service: Endoscopy;  Laterality: N/A;  . GANGLION CYST EXCISION  12/2018    Family History  Problem Relation Age of Onset  . Lung disease Father   . Lung cancer Brother     Social History:  reports that she quit smoking about 15 years ago. Her smoking use included cigarettes. She has a 25.00 pack-year  smoking history. She has never used smokeless tobacco. She reports that she does not drink alcohol or use drugs.  Allergies:  Allergies  Allergen Reactions  . Aleve [Naproxen Sodium] Rash    Medications reviewed.    ROS Full ROS performed and is otherwise negative other than what is stated in HPI   BP 124/81   Pulse 75   Temp 97.9 F (36.6 C) (Temporal)   Ht 5\' 4"  (1.626 m)   Wt 145 lb 6.4 oz (66 kg)   SpO2 94%   BMI 24.96 kg/m   Physical Exam Vitals signs and nursing note reviewed. Exam conducted with a chaperone present.  Constitutional:      General: She is not in acute distress.    Appearance: Normal appearance. She is normal weight.  Eyes:     General: No scleral icterus.       Right eye: No discharge.        Left eye: No discharge.  Neck:     Musculoskeletal: Normal range of motion. No neck rigidity.  Cardiovascular:     Rate and Rhythm: Normal rate.     Pulses: Normal pulses.     Heart sounds: No murmur.  Pulmonary:     Effort: Pulmonary effort is normal. No respiratory distress.     Breath sounds: Normal breath sounds.  Abdominal:     General: Abdomen is flat. There is no distension.  Tenderness: There is no abdominal tenderness. There is no guarding.  Musculoskeletal: Normal range of motion.        General: No swelling.  Skin:    General: Skin is warm and dry.     Capillary Refill: Capillary refill takes less than 2 seconds.     Coloration: Skin is not jaundiced.  Neurological:     General: No focal deficit present.     Mental Status: She is alert.  Psychiatric:        Mood and Affect: Mood normal.        Behavior: Behavior normal.        Thought Content: Thought content normal.        Judgment: Judgment normal.        Assessment/Plan: 68 year old female with history of recurrent diverticulitis 2 with microperforation and also requiring hospitalization.  Patient was explained about the rationale for potential elective sigmoid colectomy.   Did explain to her the procedure.  Risk benefit and possible complications.  I also do think that we will need to make sure that she completes a more recent colonoscopy before elective surgery is attempted.  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  That will give [place] enough time to schedule a colonoscopy.  We will ask Dr. Vicente Males from GI to take care of this.  I will see her back in a couple months after she completes a colonoscopy and have a preop evaluation and another surgical discussion regarding laparoscopic sigmoid colectomy.  Extensive counseling provided  Greater than 50% of the 40 minutes  visit was spent in counseling/coordination of care   Caroleen Hamman, MD [facility]

## 2019-03-28 ENCOUNTER — Other Ambulatory Visit: Payer: Self-pay

## 2019-03-28 ENCOUNTER — Telehealth: Payer: Self-pay | Admitting: Gastroenterology

## 2019-03-28 ENCOUNTER — Ambulatory Visit (INDEPENDENT_AMBULATORY_CARE_PROVIDER_SITE_OTHER): Payer: Medicare HMO | Admitting: Gastroenterology

## 2019-03-28 DIAGNOSIS — K5732 Diverticulitis of large intestine without perforation or abscess without bleeding: Secondary | ICD-10-CM

## 2019-03-28 MED ORDER — PEG 3350-KCL-NA BICARB-NACL 420 G PO SOLR: 4000.0000 mL | Freq: Once | ORAL | 0 refills | Status: AC

## 2019-03-28 MED ORDER — NA SULFATE-K SULFATE-MG SULF 17.5-3.13-1.6 GM/177ML PO SOLN: 1.0000 | Freq: Once | ORAL | 0 refills | Status: DC

## 2019-03-28 NOTE — Progress Notes (Signed)
Julie Arias  [address]  [place]  [place], [place] [address]  Main: [phone]  Fax: [phone]   Gastroenterology Consultation  Referring Provider:     Jules Husbands, MD Primary Care Physician:  Mayra Neer, MD Reason for Consultation:   Diarrhea         HPI:   Virtual Visit via video  Note  I connected with patient on 03/28/19 at  8:30 AM EDT by video  and verified that I am speaking with the correct person using two identifiers.   I discussed the limitations, risks, security and privacy concerns of performing an evaluation and management service by video and the availability of in person appointments. I also discussed with the patient that there may be a patient responsible charge related to this service. The patient expressed understanding and agreed to proceed.  Location of the patient: Home Location of provider: Home Participating persons: Patient and provider only   History of Present Illness: Chief Complaint  Patient presents with  . Diarrhea  . Colonoscopy     Julie Arias is a 68 y.o. y/o female referred for consultation & management  by Dr Dahlia Byes   She was initially referred for diarrhea but she says that the diarrhea has resolved.  She says that the main reason she is here to speak to me today is about requiring a colonoscopy.  She has a history of recurrent diverticulitis and recently was admitted in July 2020 with diverticulitis of the sigmoid colon requiring IV antibiotics.  She states that she has had at least 8 episodes of diverticulitis 1 requiring hospitalization and 2 with microperforations.  She denies any family history of colon cancer but the mother had a colon polyp and during the process of surgery she says that her bowel was punctured and she subsequently died.  Her last colonoscopy she recalls was in 2016 and was completely normal.  She completed her antibiotics in July and subsequently had diarrhea which resolved on its own.  She  denies any weight loss.  CT scan of the abdomen on 02/04/2019 demonstrates acute sigmoid colon diverticulitis no findings of perforation or abscess small hiatal hernia.  Past Medical History:  Diagnosis Date  . Diverticulitis    caused "perforated colon" hospitalized- no surgery  . GERD (gastroesophageal reflux disease)   . Hypothyroidism   . Thyroid disease     Past Surgical History:  Procedure Laterality Date  . ABDOMINAL HYSTERECTOMY    . COLONOSCOPY W/ POLYPECTOMY    . COLONOSCOPY WITH PROPOFOL N/A 07/07/2015   Procedure: COLONOSCOPY WITH PROPOFOL;  Surgeon: Garlan Fair, MD;  Location: [facility];  Service: Endoscopy;  Laterality: N/A;  . GANGLION CYST EXCISION  12/2018    Prior to Admission medications   Medication Sig Start Date End Date Taking? Authorizing Provider  EUTHYROX 175 MCG tablet Take 175 mcg by mouth daily before breakfast.  11/29/18  Yes [provider]  ibuprofen (ADVIL) 200 MG tablet Take 400 mg by mouth 2 (two) times daily as needed for headache.   Yes [provider]  LORazepam (ATIVAN) 0.5 MG tablet Take 0.5 mg by mouth at bedtime as needed for anxiety or sleep.    Yes [provider]  omeprazole (PRILOSEC) 40 MG capsule Take 40 mg by mouth daily.   Yes [provider]  ondansetron (ZOFRAN) 4 MG tablet Take 1 tablet (4 mg total) by mouth every 8 (eight) hours as needed for nausea or vomiting.  12/20/18  Yes Evelina Bucy, DPM  traMADol (ULTRAM) 50 MG tablet Take 50 mg by mouth every 6 (six) hours as needed.   Yes [provider]  valACYclovir (VALTREX) 1000 MG tablet Take 1,000 mg by mouth 2 (two) times daily as needed (cold sores).  11/27/18  Yes [provider]    Family History  Problem Relation Age of Onset  . Lung disease Father   . Lung cancer Brother      Social History   Tobacco Use  . Smoking status: Former Smoker    Packs/day: 1.00    Years: 25.00    Pack years: 25.00    Types:  Cigarettes    Quit date: 01/03/2004    Years since quitting: 15.2  . Smokeless tobacco: Never Used  Substance Use Topics  . Alcohol use: No  . Drug use: No    Allergies as of 03/28/2019 - Review Complete 03/28/2019  Allergen Reaction Noted  . Aleve [naproxen sodium] Rash 11/15/2013    Review of Systems:    All systems reviewed and negative except where noted in HPI. General Appearance:    Alert, cooperative, no distress, appears stated age  Head:    Normocephalic, without obvious abnormality, atraumatic  Eyes:    PERRL, conjunctiva/corneas clear,  Ears:    Grossly normal hearing    Neurologic:   Grossly appears normal     Observations/Objective:  Labs: CBC    Component Value Date/Time   WBC 4.0 02/06/2019 0529   RBC 4.55 02/06/2019 0529   HGB 14.1 02/06/2019 0529   HCT 42.1 02/06/2019 0529   PLT 152 02/06/2019 0529   MCV 92.5 02/06/2019 0529   MCH 31.0 02/06/2019 0529   MCHC 33.5 02/06/2019 0529   RDW 11.9 02/06/2019 0529   LYMPHSABS 2.0 02/04/2019 2040   MONOABS 0.7 02/04/2019 2040   EOSABS 0.1 02/04/2019 2040   BASOSABS 0.0 02/04/2019 2040   CMP     Component Value Date/Time   NA 140 02/06/2019 0529   K 4.5 02/06/2019 0529   CL 106 02/06/2019 0529   CO2 28 02/06/2019 0529   GLUCOSE 98 02/06/2019 0529   BUN 8 02/06/2019 0529   CREATININE 0.73 02/06/2019 0529   CALCIUM 9.1 02/06/2019 0529   PROT 7.1 02/04/2019 2040   ALBUMIN 3.8 02/04/2019 2040   AST 18 02/04/2019 2040   ALT 18 02/04/2019 2040   ALKPHOS 110 02/04/2019 2040   BILITOT 0.7 02/04/2019 2040   GFRNONAA >60 02/06/2019 0529   GFRAA >60 02/06/2019 0529    Imaging Studies: No results found.  Assessment and Plan:   Julie Arias is a 68 y.o. y/o female has been referred for a colonoscopy for recurrent diverticulitis.  She was recently admitted in July 2020 with sigmoid diverticulitis and treated with antibiotics.  Presently does not complain of any pain.  Doing well.  Denies any diarrhea.       Plan :   1. Colonoscopy to rule out any malignancy subsequently she can undergo sigmoid colon resection with Dr. Dahlia Byes    I discussed the assessment and treatment plan with the patient. The patient was provided an opportunity to ask questions and all were answered. The patient agreed with the plan and demonstrated an understanding of the instructions.   The patient was advised to call back or seek an in-person evaluation if the symptoms worsen or if the condition fails to improve as anticipated.     Dr Julie Bellows MD,MRCP [facility])  Gastroenterology/Hepatology Pager: [phone]   Speech recognition software was used to dictate the above note.

## 2019-03-28 NOTE — Telephone Encounter (Signed)
Pt is calling she needs [place] to call in a cheaper prep for her colonoscopy next friday

## 2019-03-28 NOTE — Telephone Encounter (Signed)
Spoke with Julie Arias and informed her that we can switch her bowel prep prescription to [facility]. Julie Arias agrees. I have explained directions on how to take. Julie Arias understands.

## 2019-04-03 ENCOUNTER — Other Ambulatory Visit
Admission: RE | Admit: 2019-04-03 | Discharge: 2019-04-03 | Disposition: A | Discharge status: Home / Self Care | Payer: Medicare HMO | Source: Ambulatory Visit | Attending: Gastroenterology | Admitting: Gastroenterology

## 2019-04-03 ENCOUNTER — Other Ambulatory Visit: Payer: Self-pay

## 2019-04-03 DIAGNOSIS — Z01812 Encounter for preprocedural laboratory examination: Secondary | ICD-10-CM | POA: Insufficient documentation

## 2019-04-03 DIAGNOSIS — Z20828 Contact with and (suspected) exposure to other viral communicable diseases: Secondary | ICD-10-CM | POA: Diagnosis not present

## 2019-04-03 LAB — SARS CORONAVIRUS 2 (TAT 6-24 HRS): SARS Coronavirus 2: NEGATIVE

## 2019-04-05 ENCOUNTER — Encounter: Payer: Self-pay | Admitting: *Deleted

## 2019-04-06 ENCOUNTER — Encounter: Payer: Self-pay | Admitting: *Deleted

## 2019-04-06 ENCOUNTER — Encounter
Admission: RE | Disposition: A | Discharge status: Home / Self Care | Payer: Self-pay | Source: Home / Self Care | Attending: Gastroenterology

## 2019-04-06 ENCOUNTER — Ambulatory Visit
Admission: RE | Admit: 2019-04-06 | Discharge: 2019-04-06 | Disposition: A | Discharge status: Home / Self Care | Payer: Medicare HMO | Attending: Gastroenterology | Admitting: Gastroenterology

## 2019-04-06 ENCOUNTER — Ambulatory Visit: Payer: Medicare HMO | Admitting: Anesthesiology

## 2019-04-06 DIAGNOSIS — D126 Benign neoplasm of colon, unspecified: Secondary | ICD-10-CM | POA: Diagnosis not present

## 2019-04-06 DIAGNOSIS — Z886 Allergy status to analgesic agent status: Secondary | ICD-10-CM | POA: Diagnosis not present

## 2019-04-06 DIAGNOSIS — R69 Illness, unspecified: Secondary | ICD-10-CM | POA: Diagnosis not present

## 2019-04-06 DIAGNOSIS — Z79890 Hormone replacement therapy (postmenopausal): Secondary | ICD-10-CM | POA: Diagnosis not present

## 2019-04-06 DIAGNOSIS — F419 Anxiety disorder, unspecified: Secondary | ICD-10-CM | POA: Insufficient documentation

## 2019-04-06 DIAGNOSIS — D125 Benign neoplasm of sigmoid colon: Secondary | ICD-10-CM | POA: Diagnosis not present

## 2019-04-06 DIAGNOSIS — Z86010 Personal history of colonic polyps: Secondary | ICD-10-CM | POA: Diagnosis not present

## 2019-04-06 DIAGNOSIS — K635 Polyp of colon: Secondary | ICD-10-CM | POA: Diagnosis not present

## 2019-04-06 DIAGNOSIS — K5732 Diverticulitis of large intestine without perforation or abscess without bleeding: Secondary | ICD-10-CM

## 2019-04-06 DIAGNOSIS — K219 Gastro-esophageal reflux disease without esophagitis: Secondary | ICD-10-CM | POA: Insufficient documentation

## 2019-04-06 DIAGNOSIS — K5790 Diverticulosis of intestine, part unspecified, without perforation or abscess without bleeding: Secondary | ICD-10-CM | POA: Diagnosis not present

## 2019-04-06 DIAGNOSIS — Z87891 Personal history of nicotine dependence: Secondary | ICD-10-CM | POA: Diagnosis not present

## 2019-04-06 DIAGNOSIS — Z1211 Encounter for screening for malignant neoplasm of colon: Secondary | ICD-10-CM | POA: Diagnosis not present

## 2019-04-06 DIAGNOSIS — Z09 Encounter for follow-up examination after completed treatment for conditions other than malignant neoplasm: Secondary | ICD-10-CM | POA: Diagnosis not present

## 2019-04-06 DIAGNOSIS — Z79899 Other long term (current) drug therapy: Secondary | ICD-10-CM | POA: Insufficient documentation

## 2019-04-06 DIAGNOSIS — E039 Hypothyroidism, unspecified: Secondary | ICD-10-CM | POA: Diagnosis not present

## 2019-04-06 HISTORY — PX: COLONOSCOPY WITH PROPOFOL: SHX5780

## 2019-04-06 SURGERY — COLONOSCOPY WITH PROPOFOL
Anesthesia: General

## 2019-04-06 MED ORDER — ETOMIDATE 2 MG/ML IV SOLN: INTRAVENOUS | Status: DC | PRN

## 2019-04-06 MED ORDER — PROPOFOL 10 MG/ML IV BOLUS
INTRAVENOUS | Status: DC | PRN
  Administered 2019-04-06: 10 mg via INTRAVENOUS
  Administered 2019-04-06 (×2): 20 mg via INTRAVENOUS
  Administered 2019-04-06: 50 mg via INTRAVENOUS

## 2019-04-06 MED ORDER — LIDOCAINE HCL (PF) 2 % IJ SOLN
INTRAMUSCULAR | Status: AC
  Filled 2019-04-06: qty 10

## 2019-04-06 MED ORDER — SODIUM CHLORIDE 0.9 % IV SOLN
INTRAVENOUS | Status: DC
  Administered 2019-04-06: 08:00:00 via INTRAVENOUS

## 2019-04-06 MED ORDER — LIDOCAINE HCL (CARDIAC) PF 100 MG/5ML IV SOSY
PREFILLED_SYRINGE | INTRAVENOUS | Status: DC | PRN
  Administered 2019-04-06: 40 mg via INTRAVENOUS

## 2019-04-06 MED ORDER — PROPOFOL 500 MG/50ML IV EMUL
INTRAVENOUS | Status: DC | PRN
  Administered 2019-04-06: 100 ug/kg/min via INTRAVENOUS

## 2019-04-06 MED ORDER — PROPOFOL 500 MG/50ML IV EMUL
INTRAVENOUS | Status: AC
  Filled 2019-04-06: qty 50

## 2019-04-06 NOTE — Anesthesia Preprocedure Evaluation (Addendum)
Anesthesia Evaluation  Patient identified by MRN, date of birth, ID band Patient awake    Reviewed: Allergy & Precautions, H&P , NPO status , Patient's Chart, lab work & pertinent test results  Airway Mallampati: II  TM Distance: >3 FB     Dental  (+) Upper Dentures, Lower Dentures   Pulmonary neg pulmonary ROS, neg COPD, former smoker,           Cardiovascular (-) angina(-) Past MI negative cardio ROS  (-) dysrhythmias      Neuro/Psych PSYCHIATRIC DISORDERS Anxiety negative neurological ROS     GI/Hepatic Neg liver ROS, GERD  ,  Endo/Other  Hypothyroidism   Renal/GU negative Renal ROS  negative genitourinary   Musculoskeletal   Abdominal   Peds  Hematology negative hematology ROS (+)   Anesthesia Other Findings Past Medical History: No date: Diverticulitis     Comment:  caused "perforated colon" hospitalized- no surgery No date: GERD (gastroesophageal reflux disease) No date: Hypothyroidism No date: Thyroid disease  Past Surgical History: No date: ABDOMINAL HYSTERECTOMY No date: COLONOSCOPY W/ POLYPECTOMY 07/07/2015: COLONOSCOPY WITH PROPOFOL; N/A     Comment:  Procedure: COLONOSCOPY WITH PROPOFOL;  Surgeon: Garlan Fair, MD;  Location: [facility];  Service:               Endoscopy;  Laterality: N/A; 12/2018: GANGLION CYST EXCISION  BMI    Body Mass Index: 24.80 kg/m      Reproductive/Obstetrics negative OB ROS                            Anesthesia Physical Anesthesia Plan  ASA: II  Anesthesia Plan: General   Post-op Pain Management:    Induction:   PONV Risk Score and Plan: Propofol infusion and TIVA  Airway Management Planned: Natural Airway and Nasal Cannula  Additional Equipment:   Intra-op Plan:   Post-operative Plan:   Informed Consent: I have reviewed the patients History and Physical, chart, labs and discussed the procedure including  the risks, benefits and alternatives for the proposed anesthesia with the patient or authorized representative who has indicated his/her understanding and acceptance.     Dental Advisory Given  Plan Discussed with: Anesthesiologist and CRNA  Anesthesia Plan Comments:         Anesthesia Quick Evaluation

## 2019-04-06 NOTE — Anesthesia Postprocedure Evaluation (Signed)
Anesthesia Post Note  Patient: Julie Arias  Procedure(s) Performed: COLONOSCOPY WITH PROPOFOL (N/A )  Patient location during evaluation: PACU Anesthesia Type: General Level of consciousness: awake and alert Pain management: pain level controlled Vital Signs Assessment: post-procedure vital signs reviewed and stable Respiratory status: spontaneous breathing, nonlabored ventilation and respiratory function stable Cardiovascular status: blood pressure returned to baseline and stable Postop Assessment: no apparent nausea or vomiting Anesthetic complications: no     Last Vitals:  Vitals:   04/06/19 0752 04/06/19 0937  BP: 125/77   Pulse: 67   Resp: 18 12  Temp: (!) 35.7 C (!) 36.1 C    Last Pain:  Vitals:   04/06/19 1007  TempSrc:   PainSc: 0-No pain                 Durenda Hurt

## 2019-04-06 NOTE — Transfer of Care (Signed)
Immediate Anesthesia Transfer of Care Note  Patient: Julie Arias  Procedure(s) Performed: COLONOSCOPY WITH PROPOFOL (N/A )  Patient Location: PACU  Anesthesia Type:General  Level of Consciousness: drowsy  Airway & Oxygen Therapy: Patient Spontanous Breathing  Post-op Assessment: Report given to RN and Post -op Vital signs reviewed and stable  Post vital signs: Reviewed  Last Vitals:  Vitals Value Taken Time  BP    Temp    Pulse 80 04/06/19 0938  Resp    SpO2 97 % 04/06/19 0938  Vitals shown include unvalidated device data.  Last Pain:  Vitals:   04/06/19 0752  TempSrc: Tympanic         Complications: No apparent anesthesia complications

## 2019-04-06 NOTE — Op Note (Signed)
[facility] Gastroenterology Patient Name: Julie Arias Procedure Date: 04/06/2019 8:54 AM MRN: [id number] Account #: [id number] Date of Birth: February 12, 1951 Admit Type: Outpatient Age: 68 Room: [facility] ENDO ROOM 4 Gender: Female Note Status: Finalized Procedure:            Colonoscopy Indications:          Follow-up of diverticulitis Providers:            Jonathon Bellows MD, MD Referring MD:         Mayra Neer (Referring MD) Medicines:            Monitored Anesthesia Care Complications:        No immediate complications. Procedure:            Pre-Anesthesia Assessment:                       - Prior to the procedure, a History and Physical was                        performed, and patient medications, allergies and                        sensitivities were reviewed. The patient's tolerance of                        previous anesthesia was reviewed.                       - The risks and benefits of the procedure and the                        sedation options and risks were discussed with the                        patient. All questions were answered and informed                        consent was obtained.                       - ASA Grade Assessment: II - A patient with mild                        systemic disease.                       After obtaining informed consent, the colonoscope was                        passed under direct vision. Throughout the procedure,                        the patient's blood pressure, pulse, and oxygen                        saturations were monitored continuously. The                        Colonoscope was introduced through the anus and                        advanced to  the the cecum, identified by the                        appendiceal orifice. The colonoscopy was performed with                        ease. The patient tolerated the procedure well. The                        quality of the bowel preparation was  excellent. Findings:      The perianal and digital rectal examinations were normal.      A 5 mm polyp was found in the sigmoid colon. The polyp was sessile. The       polyp was removed with a cold biopsy forceps. Resection and retrieval       were complete.      The exam was otherwise without abnormality on direct and retroflexion       views.      Multiple small-mouthed diverticula were found in the sigmoid colon.      The exam was otherwise without abnormality on direct and retroflexion       views.      The perianal and digital rectal examinations were normal. Impression:           - One 5 mm polyp in the sigmoid colon, removed with a                        cold biopsy forceps. Resected and retrieved.                       - The examination was otherwise normal on direct and                        retroflexion views.                       - Diverticulosis in the sigmoid colon.                       - The examination was otherwise normal on direct and                        retroflexion views. Recommendation:       - Discharge patient to home (with escort).                       - Resume previous diet.                       - Continue present medications.                       - Await pathology results.                       - Repeat colonoscopy in 5 years for surveillance. Procedure Code(s):    --- Professional ---                       [phone], Colonoscopy, flexible; with biopsy, single or  multiple Diagnosis Code(s):    --- Professional ---                       K63.5, Polyp of colon                       K57.32, Diverticulitis of large intestine without                        perforation or abscess without bleeding                       K57.30, Diverticulosis of large intestine without                        perforation or abscess without bleeding CPT copyright 2019 American Medical Association. All rights reserved. The codes documented in this report are  preliminary and upon coder review may  be revised to meet current compliance requirements. Jonathon Bellows, MD Jonathon Bellows MD, MD 04/06/2019 9:36:59 AM This report has been signed electronically. Number of Addenda: 0 Note Initiated On: 04/06/2019 8:54 AM Scope Withdrawal Time: 0 hours 21 minutes 48 seconds  Total Procedure Duration: 0 hours 28 minutes 53 seconds  Estimated Blood Loss: Estimated blood loss: none.      [facility]

## 2019-04-06 NOTE — H&P (Signed)
Jonathon Bellows, MD [address] [place], [place], [place], [address] [address], [place], [place], [place], [address] Phone: [phone]  Fax: [phone]  Primary Care Physician:  Mayra Neer, MD   Pre-Procedure History & Physical: HPI:  Julie Arias is a 68 y.o. female is here for an colonoscopy.   Past Medical History:  Diagnosis Date  . Diverticulitis    caused "perforated colon" hospitalized- no surgery  . GERD (gastroesophageal reflux disease)   . Hypothyroidism   . Thyroid disease     Past Surgical History:  Procedure Laterality Date  . ABDOMINAL HYSTERECTOMY    . COLONOSCOPY W/ POLYPECTOMY    . COLONOSCOPY WITH PROPOFOL N/A 07/07/2015   Procedure: COLONOSCOPY WITH PROPOFOL;  Surgeon: Garlan Fair, MD;  Location: [facility];  Service: Endoscopy;  Laterality: N/A;  . GANGLION CYST EXCISION  12/2018    Prior to Admission medications   Medication Sig Start Date End Date Taking? Authorizing Provider  EUTHYROX 175 MCG tablet Take 175 mcg by mouth daily before breakfast.  11/29/18  Yes [provider]  ibuprofen (ADVIL) 200 MG tablet Take 400 mg by mouth 2 (two) times daily as needed for headache.   Yes [provider]  LORazepam (ATIVAN) 0.5 MG tablet Take 0.5 mg by mouth at bedtime as needed for anxiety or sleep.    Yes [provider]  omeprazole (PRILOSEC) 40 MG capsule Take 40 mg by mouth daily.   Yes [provider]  ondansetron (ZOFRAN) 4 MG tablet Take 1 tablet (4 mg total) by mouth every 8 (eight) hours as needed for nausea or vomiting. 12/20/18  Yes Evelina Bucy, DPM  traMADol (ULTRAM) 50 MG tablet Take 50 mg by mouth every 6 (six) hours as needed.   Yes [provider]  valACYclovir (VALTREX) 1000 MG tablet Take 1,000 mg by mouth 2 (two) times daily as needed (cold sores).  11/27/18  Yes [provider]    Allergies as of 03/28/2019 - Review Complete 03/28/2019  Allergen Reaction  Noted  . Aleve [naproxen sodium] Rash 11/15/2013    Family History  Problem Relation Age of Onset  . Lung disease Father   . Lung cancer Brother     Social History   Socioeconomic History  . Marital status: Married    Spouse name: Not on file  . Number of children: Not on file  . Years of education: Not on file  . Highest education level: Not on file  Occupational History  . Not on file  Social Needs  . Financial resource strain: Not on file  . Food insecurity    Worry: Not on file    Inability: Not on file  . Transportation needs    Medical: Not on file    Non-medical: Not on file  Tobacco Use  . Smoking status: Former Smoker    Packs/day: 1.00    Years: 25.00    Pack years: 25.00    Types: Cigarettes    Quit date: 01/03/2004    Years since quitting: 15.2  . Smokeless tobacco: Never Used  Substance and Sexual Activity  . Alcohol use: No  . Drug use: No  . Sexual activity: Not on file  Lifestyle  . Physical activity    Days per week: Not on file    Minutes per session: Not on file  . Stress: Not on file  Relationships  . Social Herbalist on phone: Not  on file    Gets together: Not on file    Attends religious service: Not on file    Active member of club or organization: Not on file    Attends meetings of clubs or organizations: Not on file    Relationship status: Not on file  . Intimate partner violence    Fear of current or ex partner: Not on file    Emotionally abused: Not on file    Physically abused: Not on file    Forced sexual activity: Not on file  Other Topics Concern  . Not on file  Social History Narrative  . Not on file    Review of Systems: See HPI, otherwise negative ROS  Physical Exam: BP 125/77   Pulse 67   Temp (!) 96.3 F (35.7 C) (Tympanic)   Resp 18   Ht 5\' 3"  (1.6 m)   Wt 63.5 kg   BMI 24.80 kg/m  General:   Alert,  pleasant and cooperative in NAD Head:  Normocephalic and atraumatic. Neck:  Supple; no masses  or thyromegaly. Lungs:  Clear throughout to auscultation, normal respiratory effort.    Heart:  +S1, +S2, Regular rate and rhythm, No edema. Abdomen:  Soft, nontender and nondistended. Normal bowel sounds, without guarding, and without rebound.   Neurologic:  Alert and  oriented x4;  grossly normal neurologically.  Impression/Plan: Julie Arias is here for an colonoscopy to be performed for  Diverticulitis  Risks, benefits, limitations, and alternatives regarding  colonoscopy have been reviewed with the patient.  Questions have been answered.  All parties agreeable.   Jonathon Bellows, MD  04/06/2019, 8:51 AM

## 2019-04-06 NOTE — Anesthesia Post-op Follow-up Note (Signed)
Anesthesia QCDR form completed.        

## 2019-04-10 LAB — SURGICAL PATHOLOGY

## 2019-04-11 ENCOUNTER — Encounter: Payer: Self-pay | Admitting: Gastroenterology

## 2019-05-02 ENCOUNTER — Inpatient Hospital Stay
Admission: AD | Admit: 2019-05-02 | Discharge: 2019-05-10 | DRG: 331 | Disposition: A | Discharge status: Home / Self Care | Payer: Medicare HMO | Source: Ambulatory Visit | Attending: Surgery | Admitting: Surgery

## 2019-05-02 ENCOUNTER — Ambulatory Visit: Payer: Medicare HMO | Admitting: Surgery

## 2019-05-02 ENCOUNTER — Inpatient Hospital Stay: Payer: Medicare HMO

## 2019-05-02 ENCOUNTER — Other Ambulatory Visit: Payer: Self-pay

## 2019-05-02 ENCOUNTER — Encounter: Payer: Self-pay | Admitting: *Deleted

## 2019-05-02 ENCOUNTER — Encounter: Payer: Self-pay | Admitting: Surgery

## 2019-05-02 VITALS — BP 137/80 | HR 77 | Temp 98.6°F | Ht 64.0 in | Wt 144.2 lb

## 2019-05-02 DIAGNOSIS — Z87891 Personal history of nicotine dependence: Secondary | ICD-10-CM | POA: Diagnosis not present

## 2019-05-02 DIAGNOSIS — K5730 Diverticulosis of large intestine without perforation or abscess without bleeding: Secondary | ICD-10-CM | POA: Diagnosis not present

## 2019-05-02 DIAGNOSIS — E039 Hypothyroidism, unspecified: Secondary | ICD-10-CM | POA: Diagnosis present

## 2019-05-02 DIAGNOSIS — K660 Peritoneal adhesions (postprocedural) (postinfection): Secondary | ICD-10-CM | POA: Diagnosis present

## 2019-05-02 DIAGNOSIS — Z20828 Contact with and (suspected) exposure to other viral communicable diseases: Secondary | ICD-10-CM | POA: Diagnosis present

## 2019-05-02 DIAGNOSIS — K5732 Diverticulitis of large intestine without perforation or abscess without bleeding: Secondary | ICD-10-CM

## 2019-05-02 DIAGNOSIS — Z886 Allergy status to analgesic agent status: Secondary | ICD-10-CM

## 2019-05-02 DIAGNOSIS — Z801 Family history of malignant neoplasm of trachea, bronchus and lung: Secondary | ICD-10-CM

## 2019-05-02 DIAGNOSIS — K219 Gastro-esophageal reflux disease without esophagitis: Secondary | ICD-10-CM | POA: Diagnosis not present

## 2019-05-02 DIAGNOSIS — K5792 Diverticulitis of intestine, part unspecified, without perforation or abscess without bleeding: Secondary | ICD-10-CM | POA: Diagnosis not present

## 2019-05-02 LAB — CBC WITH DIFFERENTIAL/PLATELET
Abs Immature Granulocytes: 0.02 10*3/uL (ref 0.00–0.07)
Basophils Absolute: 0 10*3/uL (ref 0.0–0.1)
Basophils Relative: 1 %
Eosinophils Absolute: 0.1 10*3/uL (ref 0.0–0.5)
Eosinophils Relative: 1 %
HCT: 44.9 % (ref 36.0–46.0)
Hemoglobin: 15.4 g/dL — ABNORMAL HIGH (ref 12.0–15.0)
Immature Granulocytes: 0 %
Lymphocytes Relative: 25 %
Lymphs Abs: 1.6 10*3/uL (ref 0.7–4.0)
MCH: 30.1 pg (ref 26.0–34.0)
MCHC: 34.3 g/dL (ref 30.0–36.0)
MCV: 87.7 fL (ref 80.0–100.0)
Monocytes Absolute: 0.4 10*3/uL (ref 0.1–1.0)
Monocytes Relative: 7 %
Neutro Abs: 4.2 10*3/uL (ref 1.7–7.7)
Neutrophils Relative %: 66 %
Platelets: 173 10*3/uL (ref 150–400)
RBC: 5.12 MIL/uL — ABNORMAL HIGH (ref 3.87–5.11)
RDW: 11.9 % (ref 11.5–15.5)
WBC: 6.4 10*3/uL (ref 4.0–10.5)
nRBC: 0 % (ref 0.0–0.2)

## 2019-05-02 LAB — COMPREHENSIVE METABOLIC PANEL
ALT: 26 U/L (ref 0–44)
AST: 26 U/L (ref 15–41)
Albumin: 4 g/dL (ref 3.5–5.0)
Alkaline Phosphatase: 115 U/L (ref 38–126)
Anion gap: 12 (ref 5–15)
BUN: 8 mg/dL (ref 8–23)
CO2: 23 mmol/L (ref 22–32)
Calcium: 9.5 mg/dL (ref 8.9–10.3)
Chloride: 103 mmol/L (ref 98–111)
Creatinine, Ser: 0.56 mg/dL (ref 0.44–1.00)
GFR calc Af Amer: >60 mL/min (ref >60)
GFR calc non Af Amer: >60 mL/min (ref >60)
Glucose, Bld: 94 mg/dL (ref 70–99)
Potassium: 3.8 mmol/L (ref 3.5–5.1)
Sodium: 138 mmol/L (ref 135–145)
Total Bilirubin: 1 mg/dL (ref 0.3–1.2)
Total Protein: 7.4 g/dL (ref 6.5–8.1)

## 2019-05-02 LAB — PROTIME-INR
INR: 1 (ref 0.8–1.2)
Prothrombin Time: 12.9 seconds (ref 11.4–15.2)

## 2019-05-02 MED ORDER — ACETAMINOPHEN 500 MG PO TABS
1000.0000 mg | ORAL_TABLET | Freq: Four times a day (QID) | ORAL | Status: DC
  Administered 2019-05-02 – 2019-05-10 (×26): 1000 mg via ORAL
  Filled 2019-05-02 (×30): qty 2

## 2019-05-02 MED ORDER — IOHEXOL 300 MG/ML  SOLN
100.0000 mL | Freq: Once | INTRAMUSCULAR | Status: AC | PRN
  Administered 2019-05-02: 100 mL via INTRAVENOUS

## 2019-05-02 MED ORDER — DIPHENHYDRAMINE HCL 25 MG PO CAPS: 25.0000 mg | ORAL_CAPSULE | Freq: Four times a day (QID) | ORAL | Status: DC | PRN

## 2019-05-02 MED ORDER — DIPHENHYDRAMINE HCL 50 MG/ML IJ SOLN: 25.0000 mg | Freq: Four times a day (QID) | INTRAMUSCULAR | Status: DC | PRN

## 2019-05-02 MED ORDER — PROCHLORPERAZINE EDISYLATE 10 MG/2ML IJ SOLN
5.0000 mg | Freq: Four times a day (QID) | INTRAMUSCULAR | Status: DC | PRN
  Filled 2019-05-02: qty 5
  Filled 2019-05-02: qty 2

## 2019-05-02 MED ORDER — PIPERACILLIN-TAZOBACTAM 3.375 G IVPB
3.3750 g | Freq: Three times a day (TID) | INTRAVENOUS | Status: DC
  Administered 2019-05-02 – 2019-05-10 (×24): 3.375 g via INTRAVENOUS
  Filled 2019-05-02 (×22): qty 50

## 2019-05-02 MED ORDER — ONDANSETRON 4 MG PO TBDP
4.0000 mg | ORAL_TABLET | Freq: Four times a day (QID) | ORAL | Status: DC | PRN
  Administered 2019-05-03: 4 mg via ORAL
  Filled 2019-05-02: qty 1

## 2019-05-02 MED ORDER — PANTOPRAZOLE SODIUM 40 MG IV SOLR
40.0000 mg | Freq: Every day | INTRAVENOUS | Status: DC
  Administered 2019-05-02 – 2019-05-05 (×4): 40 mg via INTRAVENOUS
  Filled 2019-05-02 (×4): qty 40

## 2019-05-02 MED ORDER — METRONIDAZOLE 500 MG PO TABS: 500.0000 mg | ORAL_TABLET | Freq: Three times a day (TID) | ORAL | 0 refills | Status: DC

## 2019-05-02 MED ORDER — OXYCODONE HCL 5 MG PO TABS
5.0000 mg | ORAL_TABLET | ORAL | Status: DC | PRN
  Administered 2019-05-02 – 2019-05-03 (×3): 5 mg via ORAL
  Administered 2019-05-05 (×3): 10 mg via ORAL
  Administered 2019-05-05 – 2019-05-07 (×4): 5 mg via ORAL
  Filled 2019-05-02 (×4): qty 2
  Filled 2019-05-02 (×3): qty 1
  Filled 2019-05-02: qty 2
  Filled 2019-05-02 (×3): qty 1

## 2019-05-02 MED ORDER — HEPARIN SODIUM (PORCINE) 5000 UNIT/ML IJ SOLN
5000.0000 [IU] | Freq: Three times a day (TID) | INTRAMUSCULAR | Status: DC
  Administered 2019-05-02 – 2019-05-10 (×21): 5000 [IU] via SUBCUTANEOUS
  Filled 2019-05-02 (×22): qty 1

## 2019-05-02 MED ORDER — MORPHINE SULFATE (PF) 2 MG/ML IV SOLN
2.0000 mg | INTRAVENOUS | Status: DC | PRN
  Administered 2019-05-04 – 2019-05-06 (×5): 2 mg via INTRAVENOUS
  Filled 2019-05-02 (×5): qty 1

## 2019-05-02 MED ORDER — SODIUM CHLORIDE 0.9 % IV SOLN
INTRAVENOUS | Status: DC
  Administered 2019-05-02 – 2019-05-04 (×6): via INTRAVENOUS

## 2019-05-02 MED ORDER — SODIUM CHLORIDE 0.9 % IV BOLUS
1000.0000 mL | Freq: Once | INTRAVENOUS | Status: AC
  Administered 2019-05-02: 1000 mL via INTRAVENOUS

## 2019-05-02 MED ORDER — CIPROFLOXACIN HCL 500 MG PO TABS: 500.0000 mg | ORAL_TABLET | Freq: Two times a day (BID) | ORAL | 0 refills | Status: DC

## 2019-05-02 MED ORDER — INFLUENZA VAC A&B SA ADJ QUAD 0.5 ML IM PRSY
0.5000 mL | PREFILLED_SYRINGE | INTRAMUSCULAR | Status: DC
  Filled 2019-05-02: qty 0.5

## 2019-05-02 MED ORDER — ONDANSETRON HCL 4 MG/2ML IJ SOLN
4.0000 mg | Freq: Four times a day (QID) | INTRAMUSCULAR | Status: DC | PRN
  Administered 2019-05-02 – 2019-05-07 (×4): 4 mg via INTRAVENOUS
  Filled 2019-05-02 (×3): qty 2

## 2019-05-02 MED ORDER — PROCHLORPERAZINE MALEATE 10 MG PO TABS
10.0000 mg | ORAL_TABLET | Freq: Four times a day (QID) | ORAL | Status: DC | PRN
  Filled 2019-05-02: qty 1

## 2019-05-02 NOTE — H&P (Signed)
HPI:  Julie Arias is a 68 year old well-known to me with a prior history of recurrent diverticulitis.  She did have her colonoscopy showing only a small polyp and diverticular disease I have personally reviewed the images.  No evidence or concern for malignancy.  She reports that over the last 2 or 3 days her pain started back is located in the left lower quadrant is sharp intermittent worsening with movement.  She does have decreased appetite and some nausea.  No vomiting.  No fevers no chills. She still able to keep p.o. intake      Past Medical History:  Diagnosis Date  . Diverticulitis    caused "perforated colon" hospitalized- no surgery  . GERD (gastroesophageal reflux disease)   . Hypothyroidism   . Thyroid disease          Past Surgical History:  Procedure Laterality Date  . ABDOMINAL HYSTERECTOMY    . COLONOSCOPY W/ POLYPECTOMY    . COLONOSCOPY WITH PROPOFOL N/A 07/07/2015   Procedure: COLONOSCOPY WITH PROPOFOL;  Surgeon: Garlan Fair, MD;  Location: [facility] ENDOSCOPY;  Service: Endoscopy;  Laterality: N/A;  . COLONOSCOPY WITH PROPOFOL N/A 04/06/2019   Procedure: COLONOSCOPY WITH PROPOFOL;  Surgeon: Jonathon Bellows, MD;  Location: [facility] ENDOSCOPY;  Service: Gastroenterology;  Laterality: N/A;  . GANGLION CYST EXCISION  12/2018         Family History  Problem Relation Age of Onset  . Lung disease Father   . Lung cancer Brother     Social History:  reports that she quit smoking about 15 years ago. Her smoking use included cigarettes. She has a 25.00 pack-year smoking history. She has never used smokeless tobacco. She reports that she does not drink alcohol or use drugs.  Allergies:      Allergies  Allergen Reactions  . Aleve [Naproxen Sodium] Rash    Medications reviewed.  ROS Full ROS performed and is otherwise negative other than what is stated in HPI  BP 137/80   Pulse 77   Temp 98.6 F (37 C)   Ht 5\' 4"  (1.626 m)   Wt 144 lb 3.2 oz (65.4 kg)    SpO2 97%   BMI 24.75 kg/m   Physical Exam Vitals signs and nursing note reviewed. Exam conducted with a chaperone present.  Constitutional:      General: She is not in acute distress.    Appearance: She is normal weight.  Eyes:     General: No scleral icterus.       Right eye: No discharge.        Left eye: No discharge.  Neck:     Musculoskeletal: Normal range of motion and neck supple. No neck rigidity or muscular tenderness.  Cardiovascular:     Rate and Rhythm: Normal rate and regular rhythm.  Pulmonary:     Effort: Pulmonary effort is normal. No respiratory distress.     Breath sounds: Normal breath sounds.  Abdominal:     General: Abdomen is flat. There is no distension.     Palpations: There is no mass.     Tenderness: There is abdominal tenderness. There is guarding. There is no left CVA tenderness or rebound.     Hernia: No hernia is present.     Comments: voluntary guarding, Moderate TTP LLQ some focal peritonitis   Skin:    General: Skin is warm and dry.     Capillary Refill: Capillary refill takes less than 2 seconds.  Neurological:     General: No  focal deficit present.     Mental Status: She is alert and oriented to person, place, and time.  Psychiatric:        Mood and Affect: Mood normal.        Behavior: Behavior normal.        Thought Content: Thought content normal.        Judgment: Judgment normal.       Assessment/Plan: 1. Diverticulitis of colon without hemorrhage  Given PE  I am concerned about the severity of diverticulitis and definitely recommend admission w IV a/bs, CT A/p ASAP IV fluid hydration. We will reasess w CT findings. Hopefully we do not have to do a Hartmann's. Extensive counseling provided. Addendum, I reviewed labs and showed hemoconcentration still pending CT. Plan as above   Caroleen Hamman, MD [place] Surgeon

## 2019-05-02 NOTE — Patient Instructions (Addendum)
Do not eat or drink anything right now. We are going to have you admitted to [facility] for antibiotics and scanning.   We will call you once a bed becomes available. You will report to the [place] entrance at [facility] and go to the Registration desk.  We will speak with you later about getting the surgery scheduled.

## 2019-05-02 NOTE — H&P (Signed)
CT pers. Reviewed. No abscess or free perforation,. Continue current RX

## 2019-05-02 NOTE — Progress Notes (Signed)
Outpatient Surgical Follow Up  05/02/2019  Julie Arias is an 68 y.o. female.   Chief Complaint  Patient presents with  . Follow-up    HPI:  Julie Arias is a 68 year old well-known to me with a prior history of recurrent diverticulitis.  She did have her colonoscopy showing only a small polyp and diverticular disease.  No evidence of concern for malignancy.  She reports that over the last 2 or 3 days her pain started back is located in the left lower quadrant is sharp intermittent worsening with movement.  She does have decreased appetite and some nausea.  No vomiting.  No fevers no chills. She still able to keep p.o. intake  Past Medical History:  Diagnosis Date  . Diverticulitis    caused "perforated colon" hospitalized- no surgery  . GERD (gastroesophageal reflux disease)   . Hypothyroidism   . Thyroid disease     Past Surgical History:  Procedure Laterality Date  . ABDOMINAL HYSTERECTOMY    . COLONOSCOPY W/ POLYPECTOMY    . COLONOSCOPY WITH PROPOFOL N/A 07/07/2015   Procedure: COLONOSCOPY WITH PROPOFOL;  Surgeon: Garlan Fair, MD;  Location: [facility] ENDOSCOPY;  Service: Endoscopy;  Laterality: N/A;  . COLONOSCOPY WITH PROPOFOL N/A 04/06/2019   Procedure: COLONOSCOPY WITH PROPOFOL;  Surgeon: Jonathon Bellows, MD;  Location: [facility] ENDOSCOPY;  Service: Gastroenterology;  Laterality: N/A;  . GANGLION CYST EXCISION  12/2018    Family History  Problem Relation Age of Onset  . Lung disease Father   . Lung cancer Brother     Social History:  reports that she quit smoking about 15 years ago. Her smoking use included cigarettes. She has a 25.00 pack-year smoking history. She has never used smokeless tobacco. She reports that she does not drink alcohol or use drugs.  Allergies:  Allergies  Allergen Reactions  . Aleve [Naproxen Sodium] Rash    Medications reviewed.  ROS Full ROS performed and is otherwise negative other than what is stated in HPI  BP 137/80   Pulse 77   Temp  98.6 F (37 C)   Ht 5\' 4"  (1.626 m)   Wt 144 lb 3.2 oz (65.4 kg)   SpO2 97%   BMI 24.75 kg/m   Physical Exam Vitals signs and nursing note reviewed. Exam conducted with a chaperone present.  Constitutional:      General: She is not in acute distress.    Appearance: She is normal weight.  Eyes:     General: No scleral icterus.       Right eye: No discharge.        Left eye: No discharge.  Neck:     Musculoskeletal: Normal range of motion and neck supple. No neck rigidity or muscular tenderness.  Cardiovascular:     Rate and Rhythm: Normal rate and regular rhythm.  Pulmonary:     Effort: Pulmonary effort is normal. No respiratory distress.     Breath sounds: Normal breath sounds.  Abdominal:     General: Abdomen is flat. There is no distension.     Palpations: There is no mass.     Tenderness: There is abdominal tenderness. There is guarding. There is no left CVA tenderness or rebound.     Hernia: No hernia is present.     Comments: voluntary guarding, Moderate TTP LLQ some focal peritonitis   Skin:    General: Skin is warm and dry.     Capillary Refill: Capillary refill takes less than 2 seconds.  Neurological:  General: No focal deficit present.     Mental Status: She is alert and oriented to person, place, and time.  Psychiatric:        Mood and Affect: Mood normal.        Behavior: Behavior normal.        Thought Content: Thought content normal.        Judgment: Judgment normal.       Assessment/Plan: 1. Diverticulitis of colon without hemorrhage  Given PE  I am concerned about the severity of diverticulitis and definitely recommend admission w IV a/bs, CT A/p ASAP IV fluid hydration. We will reasess w CT findings. Hopefully we do not have to do a Hartmann's. Extensive counseling provided Greater than 50% of the 40 minutes  visit was spent in counseling/coordination of care   Caroleen Hamman, MD [place] Surgeon

## 2019-05-03 LAB — SARS CORONAVIRUS 2 (TAT 6-24 HRS): SARS Coronavirus 2: NEGATIVE

## 2019-05-03 MED ORDER — LEVOTHYROXINE SODIUM 50 MCG PO TABS
175.0000 ug | ORAL_TABLET | Freq: Every day | ORAL | Status: DC
  Administered 2019-05-05 – 2019-05-06 (×2): 175 ug via ORAL
  Filled 2019-05-03 (×3): qty 1

## 2019-05-03 MED ORDER — PEG 3350-KCL-NA BICARB-NACL 420 G PO SOLR
4000.0000 mL | Freq: Once | ORAL | Status: AC
  Administered 2019-05-03: 4000 mL via ORAL
  Filled 2019-05-03: qty 4000

## 2019-05-03 NOTE — Care Management Important Message (Signed)
Important Message  Patient Details  Name: Julie Arias MRN: [id number] Date of Birth: [birth year]   Medicare Important Message Given:  Yes  Initial Medicare IM given by Patient Access Associate on 05/02/2019 at 2:32pm.    Dannette Barbara 05/03/2019, 1:59 PM

## 2019-05-03 NOTE — Progress Notes (Signed)
[facility] SURGICAL PROGRESS NOTE [id number])  Hospital Day(s): 1.   Interval History: Patient seen and examined, no acute events or new complaints overnight. Patient reports that she is feeling much better this morning. She still has LLQ abdominal pain but this is improved compared to yesterday. No fever, chills, nausea, or emesis. No other complaints.   Review of Systems:  Constitutional: denies fever, chills  Respiratory: denies any shortness of breath  Cardiovascular: denies chest pain or palpitations  Gastrointestinal: + abdominal pain, denied N/V, or diarrhea/and bowel function as per interval history Musculoskeletal: denies pain, decreased motor or sensation   Vital signs in last 24 hours: [min-max] current  Temp:  [98 F (36.7 C)-98.6 F (37 C)] 98.4 F (36.9 C) (10/01 0607) Pulse Rate:  [61-87] 61 (10/01 0607) Resp:  [16-20] 16 (10/01 0607) BP: (117-143)/(61-87) 117/61 (10/01 0607) SpO2:  [94 %-97 %] 97 % (10/01 0607) Weight:  [65 kg-65.4 kg] 65 kg (09/30 1449)     Height: 5\' 4"  (162.6 cm) Weight: 65 kg BMI (Calculated): 24.57   Intake/Output last 2 shifts:  09/30 0701 - 10/01 0700 In: 2543.2 [I.V.:1670; IV Piggyback:873.2] Out: 1200 [Urine:1200]   Physical Exam:  Constitutional: alert, cooperative and no distress  Respiratory: breathing non-labored at rest  Cardiovascular: regular rate and sinus rhythm  Gastrointestinal: soft, LLQ tenderness, and non-distended, no rebound/guarding, no peritonitis Integumentary: warm, dry  Labs:  CBC Latest Ref Rng & Units 05/02/2019 02/06/2019 02/05/2019  WBC 4.0 - 10.5 K/uL 6.4 4.0 5.3  Hemoglobin 12.0 - 15.0 g/dL 15.4(H) 14.1 12.9  Hematocrit 36.0 - 46.0 % 44.9 42.1 39.3  Platelets 150 - 400 K/uL 173 152 143(L)   CMP Latest Ref Rng & Units 05/02/2019 02/06/2019 02/05/2019  Glucose 70 - 99 mg/dL 94 98 93  BUN 8 - 23 mg/dL 8 8 8   Creatinine 0.44 - 1.00 mg/dL 0.56 0.73 0.66  Sodium 135 - 145 mmol/L 138 140 140  Potassium  3.5 - 5.1 mmol/L 3.8 4.5 3.8  Chloride 98 - 111 mmol/L 103 106 108  CO2 22 - 32 mmol/L 23 28 24   Calcium 8.9 - 10.3 mg/dL 9.5 9.1 8.6(L)  Total Protein 6.5 - 8.1 g/dL 7.4 - -  Total Bilirubin 0.3 - 1.2 mg/dL 1.0 - -  Alkaline Phos 38 - 126 U/L 115 - -  AST 15 - 41 U/L 26 - -  ALT 0 - 44 U/L 26 - -     Imaging studies:   CT Abdomen/Pelvis (05/02/2019) personally reviewed showing diverticulitis without evidence of perforation/abscess, and radiologist report reviewed below:  IMPRESSION: Acute diverticulitis of the mid descending colon without evidence of perforation or abscess formation.  Additional extensive distal colonic diverticulosis without other site of focal inflammation. Mild chronic mural thickening may reflect sequela of prior inflammation.  Duodenal diverticulosis.  Moderate sliding-type hiatal hernia.  Aortic Atherosclerosis (ICD10-I70.0).    Assessment/Plan: (ICD-10's: K5.32) 68 y.o. female with improving LLQ abdominal pain attributed to recurrent diverticulitis without evidence of perforation or abscess   - Advance to CLD + IVF  - IV Abx (Zosyn)  - pain control prn; antiemetics prn  - serial abdominal examination   - Given the recurrence (x5 this year), she will benefit from sigmoid colectomy. The hope is to do this during this hospitalization. Timing per Dr Dahlia Byes   - All risks, benefits, and alternatives to above procedure(s) were discussed with the patient, all of her questions were answered to her expressed satisfaction, patient expresses she wishes  to proceed, and informed consent was obtained.   - mobilization encouraged  - trend labs  - medical management of comorbidities   All of the above findings and recommendations were discussed with the patient, and the medical team, and all of patient's questions were answered to her expressed satisfaction.  -- Edison Simon, PA-C [facility] 05/03/2019, 7:28 AM [phone] M-F: 7am -  4pm

## 2019-05-04 ENCOUNTER — Inpatient Hospital Stay: Payer: Medicare HMO | Admitting: Anesthesiology

## 2019-05-04 ENCOUNTER — Encounter: Payer: Self-pay | Admitting: *Deleted

## 2019-05-04 ENCOUNTER — Encounter
Admission: AD | Disposition: A | Discharge status: Home / Self Care | Payer: Self-pay | Source: Ambulatory Visit | Attending: Surgery

## 2019-05-04 DIAGNOSIS — K5732 Diverticulitis of large intestine without perforation or abscess without bleeding: Secondary | ICD-10-CM

## 2019-05-04 HISTORY — PX: COLON RESECTION: SHX5231

## 2019-05-04 LAB — CBC
HCT: 37.8 % (ref 36.0–46.0)
Hemoglobin: 13 g/dL (ref 12.0–15.0)
MCH: 30.5 pg (ref 26.0–34.0)
MCHC: 34.4 g/dL (ref 30.0–36.0)
MCV: 88.7 fL (ref 80.0–100.0)
Platelets: 149 10*3/uL — ABNORMAL LOW (ref 150–400)
RBC: 4.26 MIL/uL (ref 3.87–5.11)
RDW: 11.8 % (ref 11.5–15.5)
WBC: 3.8 10*3/uL — ABNORMAL LOW (ref 4.0–10.5)
nRBC: 0 % (ref 0.0–0.2)

## 2019-05-04 LAB — BASIC METABOLIC PANEL
Anion gap: 8 (ref 5–15)
BUN: 6 mg/dL — ABNORMAL LOW (ref 8–23)
CO2: 23 mmol/L (ref 22–32)
Calcium: 8.6 mg/dL — ABNORMAL LOW (ref 8.9–10.3)
Chloride: 110 mmol/L (ref 98–111)
Creatinine, Ser: 0.63 mg/dL (ref 0.44–1.00)
GFR calc Af Amer: >60 mL/min (ref >60)
GFR calc non Af Amer: >60 mL/min (ref >60)
Glucose, Bld: 80 mg/dL (ref 70–99)
Potassium: 3.5 mmol/L (ref 3.5–5.1)
Sodium: 141 mmol/L (ref 135–145)

## 2019-05-04 SURGERY — LAPAROSCOPIC SIGMOID COLON RESECTION
Anesthesia: General | Site: Abdomen

## 2019-05-04 MED ORDER — SODIUM CHLORIDE 0.9 % IV SOLN
INTRAVENOUS | Status: DC | PRN
  Administered 2019-05-04: 15 ug/min via INTRAVENOUS

## 2019-05-04 MED ORDER — SODIUM CHLORIDE 0.9 % IV SOLN
INTRAVENOUS | Status: DC | PRN
  Administered 2019-05-04 – 2019-05-06 (×5): 250 mL via INTRAVENOUS
  Administered 2019-05-08 – 2019-05-09 (×2): 30 mL via INTRAVENOUS
  Administered 2019-05-10: 250 mL via INTRAVENOUS

## 2019-05-04 MED ORDER — GLYCOPYRROLATE 0.2 MG/ML IJ SOLN
INTRAMUSCULAR | Status: DC | PRN
  Administered 2019-05-04: 0.2 mg via INTRAVENOUS

## 2019-05-04 MED ORDER — HYDROMORPHONE HCL 1 MG/ML IJ SOLN
INTRAMUSCULAR | Status: AC
  Administered 2019-05-04: 20:00:00 0.25 mg via INTRAVENOUS
  Filled 2019-05-04: qty 1

## 2019-05-04 MED ORDER — FENTANYL CITRATE (PF) 100 MCG/2ML IJ SOLN
INTRAMUSCULAR | Status: DC | PRN
  Administered 2019-05-04 (×2): 50 ug via INTRAVENOUS

## 2019-05-04 MED ORDER — HYDROMORPHONE HCL 1 MG/ML IJ SOLN
INTRAMUSCULAR | Status: AC
  Administered 2019-05-04: 0.25 mg via INTRAVENOUS
  Filled 2019-05-04: qty 1

## 2019-05-04 MED ORDER — PIPERACILLIN-TAZOBACTAM 3.375 G IVPB
INTRAVENOUS | Status: AC
  Filled 2019-05-04: qty 50

## 2019-05-04 MED ORDER — LACTATED RINGERS IV SOLN
INTRAVENOUS | Status: DC | PRN
  Administered 2019-05-04: 13:00:00 via INTRAVENOUS

## 2019-05-04 MED ORDER — BUPIVACAINE LIPOSOME 1.3 % IJ SUSP
INTRAMUSCULAR | Status: DC | PRN
  Administered 2019-05-04: 50 mL

## 2019-05-04 MED ORDER — HYDROMORPHONE HCL 1 MG/ML IJ SOLN
0.2500 mg | INTRAMUSCULAR | Status: AC | PRN
  Administered 2019-05-04 (×8): 0.25 mg via INTRAVENOUS

## 2019-05-04 MED ORDER — HYDROMORPHONE HCL 1 MG/ML IJ SOLN
INTRAMUSCULAR | Status: DC | PRN
  Administered 2019-05-04 (×2): 0.5 mg via INTRAVENOUS

## 2019-05-04 MED ORDER — LIDOCAINE HCL (CARDIAC) PF 100 MG/5ML IV SOSY
PREFILLED_SYRINGE | INTRAVENOUS | Status: DC | PRN
  Administered 2019-05-04: 60 mg via INTRAVENOUS

## 2019-05-04 MED ORDER — GABAPENTIN 600 MG PO TABS
300.0000 mg | ORAL_TABLET | Freq: Three times a day (TID) | ORAL | Status: DC
  Administered 2019-05-04: 300 mg via ORAL
  Filled 2019-05-04: qty 1

## 2019-05-04 MED ORDER — MIDAZOLAM HCL 2 MG/2ML IJ SOLN
INTRAMUSCULAR | Status: DC | PRN
  Administered 2019-05-04: 2 mg via INTRAVENOUS

## 2019-05-04 MED ORDER — PROPOFOL 10 MG/ML IV BOLUS
INTRAVENOUS | Status: DC | PRN
  Administered 2019-05-04: 120 mg via INTRAVENOUS

## 2019-05-04 MED ORDER — KETAMINE HCL 50 MG/ML IJ SOLN
INTRAMUSCULAR | Status: DC | PRN
  Administered 2019-05-04: 10 mg via INTRAMUSCULAR

## 2019-05-04 MED ORDER — SODIUM CHLORIDE (PF) 0.9 % IJ SOLN
INTRAMUSCULAR | Status: DC | PRN
  Administered 2019-05-04: 50 mL via INTRAVENOUS

## 2019-05-04 MED ORDER — SUGAMMADEX SODIUM 500 MG/5ML IV SOLN
INTRAVENOUS | Status: DC | PRN
  Administered 2019-05-04: 130 mg via INTRAVENOUS

## 2019-05-04 MED ORDER — PHENYLEPHRINE HCL (PRESSORS) 10 MG/ML IV SOLN
INTRAVENOUS | Status: DC | PRN
  Administered 2019-05-04 (×2): 100 ug via INTRAVENOUS

## 2019-05-04 MED ORDER — BUPIVACAINE-EPINEPHRINE (PF) 0.25% -1:200000 IJ SOLN
INTRAMUSCULAR | Status: DC | PRN
  Administered 2019-05-04: 30 mL via PERINEURAL

## 2019-05-04 MED ORDER — DEXAMETHASONE SODIUM PHOSPHATE 10 MG/ML IJ SOLN
INTRAMUSCULAR | Status: DC | PRN
  Administered 2019-05-04: 10 mg via INTRAVENOUS

## 2019-05-04 MED ORDER — ROCURONIUM BROMIDE 100 MG/10ML IV SOLN
INTRAVENOUS | Status: DC | PRN
  Administered 2019-05-04: 20 mg via INTRAVENOUS
  Administered 2019-05-04 (×2): 10 mg via INTRAVENOUS
  Administered 2019-05-04 (×3): 20 mg via INTRAVENOUS
  Administered 2019-05-04: 40 mg via INTRAVENOUS
  Administered 2019-05-04: 10 mg via INTRAVENOUS

## 2019-05-04 MED ORDER — ACETAMINOPHEN 10 MG/ML IV SOLN
INTRAVENOUS | Status: DC | PRN
  Administered 2019-05-04: 1000 mg via INTRAVENOUS

## 2019-05-04 MED ORDER — SODIUM CHLORIDE 0.9 % IV SOLN
INTRAVENOUS | Status: DC
  Administered 2019-05-04 – 2019-05-09 (×9): via INTRAVENOUS

## 2019-05-04 SURGICAL SUPPLY — 107 items
ADH SKN CLS APL DERMABOND .7 (GAUZE/BANDAGES/DRESSINGS) ×2
APL PRP STRL LF DISP 70% ISPRP (MISCELLANEOUS) ×1
APPLIER CLIP 13 LRG OPEN (CLIP) ×2
APR CLP LRG 13 20 CLIP (CLIP) ×1
BAG DECANTER FOR FLEXI CONT (MISCELLANEOUS) ×1 IMPLANT
BLADE CLIPPER SURG (BLADE) ×2 IMPLANT
BLADE SURG 15 STRL LF DISP TIS (BLADE) ×1 IMPLANT
BLADE SURG 15 STRL SS (BLADE) ×2
BLADE SURG SZ10 CARB STEEL (BLADE) ×2 IMPLANT
BLADE SURG SZ11 CARB STEEL (BLADE) ×2 IMPLANT
BULB RESERV EVAC DRAIN JP 100C (MISCELLANEOUS) ×2 IMPLANT
CANISTER SUCT 1200ML W/VALVE (MISCELLANEOUS) ×2 IMPLANT
CATH ROBINSON RED 14FR (CATHETERS) ×1 IMPLANT
CATH ROBINSON RED A/P 18FR (CATHETERS) ×1 IMPLANT
CATH URET ROBINSON RED 14FR (CATHETERS) ×1
CHLORAPREP W/TINT 26 (MISCELLANEOUS) ×2 IMPLANT
CLIP APPLIE 13 LRG OPEN (CLIP) ×1 IMPLANT
CNTNR SPEC 2.5X3XGRAD LEK (MISCELLANEOUS) ×1
CONT SPEC 4OZ STER OR WHT (MISCELLANEOUS) ×1
CONT SPEC 4OZ STRL OR WHT (MISCELLANEOUS) ×1
CONTAINER SPEC 2.5X3XGRAD LEK (MISCELLANEOUS) IMPLANT
COVER WAND RF STERILE (DRAPES) ×2 IMPLANT
DEFOGGER SCOPE WARMER CLEARIFY (MISCELLANEOUS) ×2 IMPLANT
DERMABOND ADVANCED (GAUZE/BANDAGES/DRESSINGS) ×2
DERMABOND ADVANCED .7 DNX12 (GAUZE/BANDAGES/DRESSINGS) ×2 IMPLANT
DRAIN CHANNEL JP 19F (MISCELLANEOUS) ×1 IMPLANT
DRAPE INCISE IOBAN 66X45 STRL (DRAPES) ×2 IMPLANT
DRAPE LEGGINS SURG 28X43 STRL (DRAPES) ×2 IMPLANT
DRAPE UNDER BUTTOCK W/FLU (DRAPES) ×2 IMPLANT
DRSG OPSITE POSTOP 4X6 (GAUZE/BANDAGES/DRESSINGS) ×1 IMPLANT
DRSG TEGADERM 2-3/8X2-3/4 SM (GAUZE/BANDAGES/DRESSINGS) ×2 IMPLANT
DRSG TEGADERM 2X2.25 PEDS (GAUZE/BANDAGES/DRESSINGS) ×1 IMPLANT
DRSG TEGADERM 4X4.75 (GAUZE/BANDAGES/DRESSINGS) ×1 IMPLANT
ELECT BLADE 6.5 EXT (BLADE) ×2 IMPLANT
ELECT CAUTERY BLADE 6.4 (BLADE) ×1 IMPLANT
ELECT REM PT RETURN 9FT ADLT (ELECTROSURGICAL) ×2
ELECTRODE REM PT RTRN 9FT ADLT (ELECTROSURGICAL) ×1 IMPLANT
GAUZE SPONGE 4X4 12PLY STRL (GAUZE/BANDAGES/DRESSINGS) ×1 IMPLANT
GLOVE BIO SURGEON STRL SZ7 (GLOVE) ×4 IMPLANT
GLOVE SURG SYN 7.5  E (GLOVE) ×1
GLOVE SURG SYN 7.5 E (GLOVE) ×1 IMPLANT
GLOVE SURG SYN 7.5 PF PI (GLOVE) IMPLANT
GOWN STRL REUS W/TWL LRG LVL4 (GOWN DISPOSABLE) ×12 IMPLANT
HANDLE SUCTION POOLE (INSTRUMENTS) ×1 IMPLANT
HANDLE YANKAUER SUCT BULB TIP (MISCELLANEOUS) ×2 IMPLANT
IRRIGATION STRYKERFLOW (MISCELLANEOUS) ×1 IMPLANT
IRRIGATOR STRYKERFLOW (MISCELLANEOUS) ×2
IV NS 1000ML (IV SOLUTION) ×2
IV NS 1000ML BAXH (IV SOLUTION) ×1 IMPLANT
JACKSON PRATT 10 (INSTRUMENTS) ×2 IMPLANT
L-HOOK LAP DISP 36CM (ELECTROSURGICAL) ×2
LHOOK LAP DISP 36CM (ELECTROSURGICAL) ×1 IMPLANT
MARKER SKIN DUAL TIP RULER LAB (MISCELLANEOUS) ×2 IMPLANT
NDL FILTER BLUNT 18X1 1/2 (NEEDLE) IMPLANT
NEEDLE FILTER BLUNT 18X 1/2SAF (NEEDLE) ×1
NEEDLE FILTER BLUNT 18X1 1/2 (NEEDLE) ×1 IMPLANT
NEEDLE HYPO 22GX1.5 SAFETY (NEEDLE) ×2 IMPLANT
NS IRRIG 1000ML POUR BTL (IV SOLUTION) ×2 IMPLANT
PACK COLON CLEAN CLOSURE (MISCELLANEOUS) ×2 IMPLANT
PACK LAP CHOLECYSTECTOMY (MISCELLANEOUS) ×2 IMPLANT
PENCIL ELECTRO HAND CTR (MISCELLANEOUS) ×2 IMPLANT
RELOAD STAPLE 60 2.6 WHT THN (STAPLE) IMPLANT
RELOAD STAPLE 60 3.6 BLU REG (STAPLE) ×1 IMPLANT
RELOAD STAPLER BLUE 60MM (STAPLE) ×5 IMPLANT
RELOAD STAPLER WHITE 60MM (STAPLE) ×1 IMPLANT
SCISSORS METZENBAUM CVD 33 (INSTRUMENTS) ×2 IMPLANT
SET TUBE SMOKE EVAC HIGH FLOW (TUBING) ×2 IMPLANT
SHEARS HARMONIC ACE PLUS 36CM (ENDOMECHANICALS) ×2 IMPLANT
SLEEVE ENDOPATH XCEL 5M (ENDOMECHANICALS) ×2 IMPLANT
SOL PREP PVP 2OZ (MISCELLANEOUS) ×2
SOLUTION PREP PVP 2OZ (MISCELLANEOUS) IMPLANT
SPONGE DRAIN TRACH 4X4 STRL 2S (GAUZE/BANDAGES/DRESSINGS) ×1 IMPLANT
SPONGE GAUZE 2X2 8PLY STRL LF (GAUZE/BANDAGES/DRESSINGS) ×1 IMPLANT
SPONGE LAP 18X18 RF (DISPOSABLE) ×6 IMPLANT
STAPLE ECHEON FLEX 60 POW ENDO (STAPLE) ×1 IMPLANT
STAPLER CIRCULAR 29MM (STAPLE) IMPLANT
STAPLER ENDO ILS CVD 18 33 (STAPLE) ×1 IMPLANT
STAPLER PROX 25M (MISCELLANEOUS) IMPLANT
STAPLER RELOAD BLUE 60MM (STAPLE) ×10
STAPLER RELOAD WHITE 60MM (STAPLE) ×2
STAPLER SKIN PROX 35W (STAPLE) ×2 IMPLANT
SUCT SIGMOIDOSCOPE TIP 18 W/TU (SUCTIONS) ×1 IMPLANT
SUCTION POOLE HANDLE (INSTRUMENTS)
SUT ETHILON 2 0 FS 18 (SUTURE) ×1 IMPLANT
SUT MNCRL AB 4-0 PS2 18 (SUTURE) ×3 IMPLANT
SUT PDS AB 0 CT1 27 (SUTURE) ×4 IMPLANT
SUT SILK 2 0 (SUTURE)
SUT SILK 2 0SH CR/8 30 (SUTURE) ×3 IMPLANT
SUT SILK 2-0 (SUTURE) ×2 IMPLANT
SUT SILK 2-0 18XBRD TIE 12 (SUTURE) ×1 IMPLANT
SUT SILK 3-0 (SUTURE) ×1 IMPLANT
SUT VIC AB 2-0 SH 27 (SUTURE)
SUT VIC AB 2-0 SH 27XBRD (SUTURE) ×2 IMPLANT
SUT VIC AB 3-0 SH 27 (SUTURE) ×8
SUT VIC AB 3-0 SH 27X BRD (SUTURE) IMPLANT
SYR 20ML LL LF (SYRINGE) ×4 IMPLANT
SYR 50ML LL SCALE MARK (SYRINGE) ×2 IMPLANT
SYR BULB IRRIG 60ML STRL (SYRINGE) ×1 IMPLANT
SYR TB 1ML 27GX1/2 LL (SYRINGE) ×1 IMPLANT
SYRINGE IRR TOOMEY STRL 70CC (SYRINGE) ×2 IMPLANT
SYS LAPSCP GELPORT 120MM (MISCELLANEOUS) ×2
SYSTEM LAPSCP GELPORT 120MM (MISCELLANEOUS) ×1 IMPLANT
SYSTEM UROSTOMY GENTLE TOUCH (WOUND CARE) ×1 IMPLANT
TOWEL OR 17X26 4PK STRL BLUE (TOWEL DISPOSABLE) ×3 IMPLANT
TRAY FOLEY MTR SLVR 16FR STAT (SET/KITS/TRAYS/PACK) ×2 IMPLANT
TROCAR XCEL 12X100 BLDLESS (ENDOMECHANICALS) ×2 IMPLANT
TROCAR XCEL NON-BLD 5MMX100MML (ENDOMECHANICALS) ×2 IMPLANT

## 2019-05-04 NOTE — Anesthesia Post-op Follow-up Note (Signed)
Anesthesia QCDR form completed.        

## 2019-05-04 NOTE — Care Management Important Message (Signed)
Important Message  Patient Details  Name: Julie Arias MRN: [id number] Date of Birth: Nov 30, 1950   Medicare Important Message Given:  Yes     [name] 05/04/2019, 11:18 AM

## 2019-05-04 NOTE — Anesthesia Postprocedure Evaluation (Signed)
Anesthesia Post Note  Patient: Julie Arias  Procedure(s) Performed: LAPAROSCOPIC SIGMOID COLON RESECTION (N/A Abdomen)  Patient location during evaluation: PACU Anesthesia Type: General Level of consciousness: awake and alert Pain management: pain level controlled Vital Signs Assessment: post-procedure vital signs reviewed and stable Respiratory status: spontaneous breathing, nonlabored ventilation, respiratory function stable and patient connected to nasal cannula oxygen Cardiovascular status: blood pressure returned to baseline and stable Postop Assessment: no apparent nausea or vomiting Anesthetic complications: no     Last Vitals:  Vitals:   05/04/19 2102 05/04/19 2152  BP: 103/64 (!) 94/56  Pulse: 85 85  Resp: 16 14  Temp: 36.9 C 37 C  SpO2: 95% 97%    Last Pain:  Vitals:   05/04/19 2152  TempSrc: Oral  PainSc:                  Aeryn Medici S

## 2019-05-04 NOTE — Anesthesia Preprocedure Evaluation (Addendum)
Anesthesia Evaluation  Patient identified by MRN, date of birth, ID band Patient awake    Reviewed: Allergy & Precautions, H&P , NPO status , Patient's Chart, lab work & pertinent test results  Airway Mallampati: II  TM Distance: >3 FB     Dental  (+) Upper Dentures, Lower Dentures   Pulmonary neg shortness of breath, neg COPD, neg recent URI, Not current smoker, former smoker,           Cardiovascular (-) angina(-) Past MI and (-) Cardiac Stents negative cardio ROS  (-) dysrhythmias      Neuro/Psych PSYCHIATRIC DISORDERS Anxiety negative neurological ROS     GI/Hepatic Neg liver ROS, GERD  Controlled,Recurrent diverticulitis   Endo/Other  Hypothyroidism   Renal/GU      Musculoskeletal   Abdominal   Peds  Hematology negative hematology ROS (+)   Anesthesia Other Findings Past Medical History: No date: Diverticulitis     Comment:  caused "perforated colon" hospitalized- no surgery No date: GERD (gastroesophageal reflux disease) No date: Hypothyroidism No date: Thyroid disease  Past Surgical History: No date: ABDOMINAL HYSTERECTOMY No date: COLONOSCOPY W/ POLYPECTOMY 07/07/2015: COLONOSCOPY WITH PROPOFOL; N/A     Comment:  Procedure: COLONOSCOPY WITH PROPOFOL;  Surgeon: Garlan Fair, MD;  Location: [facility] ENDOSCOPY;  Service:               Endoscopy;  Laterality: N/A; 04/06/2019: COLONOSCOPY WITH PROPOFOL; N/A     Comment:  Procedure: COLONOSCOPY WITH PROPOFOL;  Surgeon: Jonathon Bellows, MD;  Location: [facility] ENDOSCOPY;  Service:               Gastroenterology;  Laterality: N/A; 12/2018: GANGLION CYST EXCISION  BMI    Body Mass Index: 24.58 kg/m      Reproductive/Obstetrics negative OB ROS                            Anesthesia Physical Anesthesia Plan  ASA: II  Anesthesia Plan: General ETT   Post-op Pain Management:    Induction:   PONV Risk Score  and Plan: Ondansetron, Dexamethasone and Treatment may vary due to age or medical condition  Airway Management Planned:   Additional Equipment:   Intra-op Plan:   Post-operative Plan:   Informed Consent: I have reviewed the patients History and Physical, chart, labs and discussed the procedure including the risks, benefits and alternatives for the proposed anesthesia with the patient or authorized representative who has indicated his/her understanding and acceptance.     Dental Advisory Given  Plan Discussed with: Anesthesiologist and CRNA  Anesthesia Plan Comments:        Anesthesia Quick Evaluation

## 2019-05-04 NOTE — Anesthesia Procedure Notes (Signed)
Procedure Name: Intubation Performed by: Fletcher-Harrison, Fatemah Pourciau, CRNA Pre-anesthesia Checklist: Patient identified, Emergency Drugs available, Suction available and Patient being monitored Patient Re-evaluated:Patient Re-evaluated prior to induction Oxygen Delivery Method: Circle system utilized Preoxygenation: Pre-oxygenation with 100% oxygen Induction Type: IV induction Ventilation: Mask ventilation without difficulty Laryngoscope Size: McGraph and 3 Grade View: Grade I Tube type: Oral Tube size: 6.5 mm Number of attempts: 1 Airway Equipment and Method: Stylet Placement Confirmation: ETT inserted through vocal cords under direct vision,  positive ETCO2,  CO2 detector and breath sounds checked- equal and bilateral Secured at: 21 cm Tube secured with: Tape Dental Injury: Teeth and Oropharynx as per pre-operative assessment        

## 2019-05-04 NOTE — Progress Notes (Signed)
[place] Hospital Day(s): 2.   Interval History: Patient seen and examined, no acute events or new complaints overnight. Patient reports that her abdomen is feeling much better this morning. Her LLQ abdominal pain has improved significantly. She did have issues with nausea and emesis yesterday but this was attributable to drinking bowel prep. No fever or chills. Mobilizing well. No other acute issues. Plan for OR this morning with Dr Dahlia Byes.    Vital signs in last 24 hours: [min-max] current  Temp:  [98 F (36.7 C)-98.6 F (37 C)] 98 F (36.7 C) (10/02 0450) Pulse Rate:  [56-68] 68 (10/02 0450) Resp:  [17-20] 17 (10/02 0450) BP: (112-136)/(63-72) 129/63 (10/02 0450) SpO2:  [94 %-99 %] 94 % (10/02 0450)     Height: 5\' 4"  (162.6 cm) Weight: 65 kg BMI (Calculated): 24.57   Intake/Output last 2 shifts:  10/01 0701 - 10/02 0700 In: -  Out: 800 [Urine:800]   Physical Exam:  Constitutional: alert, cooperative and no distress  Respiratory: breathing non-labored at rest  Cardiovascular: regular rate and sinus rhythm  Gastrointestinal: soft, improved LLQ tenderness, and non-distended. No rebound/guarding Integumentary: warm, dry  Labs:  CBC Latest Ref Rng & Units 05/04/2019 05/02/2019 02/06/2019  WBC 4.0 - 10.5 K/uL 3.8(L) 6.4 4.0  Hemoglobin 12.0 - 15.0 g/dL 13.0 15.4(H) 14.1  Hematocrit 36.0 - 46.0 % 37.8 44.9 42.1  Platelets 150 - 400 K/uL 149(L) 173 152   CMP Latest Ref Rng & Units 05/04/2019 05/02/2019 02/06/2019  Glucose 70 - 99 mg/dL 80 94 98  BUN 8 - 23 mg/dL 6(L) 8 8  Creatinine 0.44 - 1.00 mg/dL 0.63 0.56 0.73  Sodium 135 - 145 mmol/L 141 138 140  Potassium 3.5 - 5.1 mmol/L 3.5 3.8 4.5  Chloride 98 - 111 mmol/L 110 103 106  CO2 22 - 32 mmol/L 23 23 28   Calcium 8.9 - 10.3 mg/dL 8.6(L) 9.5 9.1  Total Protein 6.5 - 8.1 g/dL - 7.4 -  Total Bilirubin 0.3 - 1.2 mg/dL - 1.0 -  Alkaline Phos 38 - 126 U/L - 115 -  AST 15 - 41 U/L - 26 -  ALT  0 - 44 U/L - 26 -     Imaging studies: No new pertinent imaging studies   Assessment/Plan: (ICD-10's: K45.32) 68 y.o. female with recurrent diverticulitis without abscess or perofration   - NPO + IVF + IV ABx (Zosyn)   - Plan for hand assisted laparoscopic colectomy +/- diverting ileostomy +/- colostomy with Dr Dahlia Byes this afternoon. Consent obtained and on chart.    - pain control prn; antiemetics prn  - Monitor abdominal examination; on-going bowel function    - mobilization encouraged             - medical management of comorbidities   All of the above findings and recommendations were discussed with the patient, and the medical team, and all of patient's questions were answered to her expressed satisfaction.  -- Edison Simon, PA-C [facility] 05/04/2019, 7:16 AM [phone] M-F: 7am - 4pm

## 2019-05-04 NOTE — Op Note (Signed)
PROCEDURES: 1. Laparoscopic lysis of adhesions 2. Laparoscopic Low anterior resection including Left colectomy 3. Laparoscopic takedown of splenic flexure 4. Diverting loop ileosotomy  Pre-operative Diagnosis: Acute on chronic diverticulitis  Post-operative Diagnosis: same  Surgeon: Marjory Lies Kaydince Towles   Assistants: Dr. Hampton Abbot Due to the complexity of the case and for the anastomosis  Anesthesia: General endotracheal anesthesia  ASA Class: 2   Surgeon: Caroleen Hamman , MD FACS  Anesthesia: Gen. with endotracheal tube   Findings: Chronic diverticulitis within two separates areas on the colon, one involving sigmoid colon and another involving Proximal descending colon I elected to do diverting loop given two areas of significant inflammation  Requiring an extended colectomy and extensive diverticulosis.   Estimated Blood Loss: 150cc         Drains: 19 Fr Pelvis         Specimens: Colon          Complications: none         Procedure Details  The patient was seen again in the Holding Room. The benefits, complications, treatment options, and expected outcomes were discussed with the patient. The risks of bleeding, infection, recurrence of symptoms, failure to resolve symptoms,  bowel injury, any of which could require further surgery were reviewed with the patient.   The patient was taken to Operating Room, identified as RAAHI BEDROSSIAN and the procedure verified.  A Time Out was held and the above information confirmed.  Prior to the induction of general anesthesia, antibiotic prophylaxis was administered. VTE prophylaxis was in place. General endotracheal anesthesia was then administered and tolerated well. After the induction, the abdomen was prepped with Chloraprep and draped in the sterile fashion. The patient was positioned in the supine position. Prior to the induction of general anesthesia, antibiotic prophylaxis was administered. VTE prophylaxis was in place. General  endotracheal anesthesia was then administered and tolerated well. After the induction, the abdomen was prepped with Chloraprep and draped in the sterile fashion. The patient was positioned in lithotomy position. 7 cm incision was created as a midline mini laparotomy. The abdominal cavity was entered under direct visualization and the GelPort device was placed. A 5 mm port was placed in the suprapubic area under direct visualization and pneumoperitoneum was obtained. There were dense adhesions from the omentum to the abdominal wall that where lysed in the standard fashion with the Harmonic scalpel. We also were able to place a 12 mm port in the right lower quadrant and a 5 mm port in the left lower quadrant under direct visualization. There was significant adhesive disease in the pelvis from the sigmoid to the pelvic wall and also from the sigmoid to the ovary  These adhesions were lysed with a combination of finger fracturing and Harmonic scalpel. The Spychalski line of pot was identified and divided and we mobilized the descending colon IN a lateral to medial fashion. We preserved the ureter at all times. We were also able to mobilize the splenic flexure using Harmonic scalpel in the standard fashion. We identified the takeoff of the inferior mesenteric artery dissected the pedicle and divided using a 60 mm vascular echelon stapler in the standard fashion. Using the Harmonic's scalpel we were able to divide the mesorectum and and also divided proximal to the mesentery of the descending colon. Once we have an adequate visualization and mobilization we divided the rectum  cdistally at the junction of the rectosigmoid area with multiple blue loads using the echelon stapler. We removed the GelPort and  visualized the colon in a direct fashion. There were two areas of Phlegmon and chronic inflammation located within the mid sigmoid colon and another located just dital to the splenic flexure. We divided the colon with  standard 60 mm blue load at the level of transverse colon due to extensive inflammatory reponse. We opened the colon and measure the diameter of the bowel. A 33 mm dilator was perfect size. A pursestring was used after inserting the anvil device. Dr. Davis Gourd was able to pass a 33 mm standard EEA stapler device through the anus and we had a little bit of difficulty but were able to finally pass the device through the end of the rectal stump. Under direct visualization we perform an end to end anastomosis with the EEA device. A leak test was performed inflating the colon with a Toomey syringe and a rubber catheter. No evidence of leak was observed. There was also adequate hemostasis. A 19 Blake drain was placed in the pelvis. We were able to mobilize the omentum and I created an omental flap to place it near the anastomosis. The drain was sutured in place with a 3-0 silk. A separate incision was created in the RLQ and cruciate incision was created on the fascia, a loop of ileum was brought and exterioraized.   All the laparoscopic ports were removed and a second look showed no evidence of any bleeding or any other injuries. We changed gloves and place a new tray to close the abdomen with a 0 PDS suture in a running fashion and the skin was closed with staples. Liposomal Marcaine was injected on all incision sites under direct visualization.  Loop ileostomy matures with multiple 3-0 vicryl sutures. Needle and laparotomy count were correct and there were no immediate complications.  Caroleen Hamman, MD, FACS

## 2019-05-04 NOTE — Transfer of Care (Signed)
Immediate Anesthesia Transfer of Care Note  Patient: Julie Arias  Procedure(s) Performed: LAPAROSCOPIC SIGMOID COLON RESECTION (N/A Abdomen)  Patient Location: PACU  Anesthesia Type:General  Level of Consciousness: awake, alert  and oriented  Airway & Oxygen Therapy: Patient Spontanous Breathing  Post-op Assessment: Report given to RN and Post -op Vital signs reviewed and stable  Post vital signs: Reviewed and stable  Last Vitals:  Vitals Value Taken Time  BP    Temp    Pulse    Resp    SpO2      Last Pain:  Vitals:   05/04/19 1238  TempSrc: Temporal  PainSc: 2       Patients Stated Pain Goal: 0 ([id number])  Complications: No apparent anesthesia complications

## 2019-05-05 ENCOUNTER — Encounter: Payer: Self-pay | Admitting: Surgery

## 2019-05-05 LAB — CBC
HCT: 37.7 % (ref 36.0–46.0)
Hemoglobin: 12.8 g/dL (ref 12.0–15.0)
MCH: 30.5 pg (ref 26.0–34.0)
MCHC: 34 g/dL (ref 30.0–36.0)
MCV: 90 fL (ref 80.0–100.0)
Platelets: 177 10*3/uL (ref 150–400)
RBC: 4.19 MIL/uL (ref 3.87–5.11)
RDW: 12 % (ref 11.5–15.5)
WBC: 13 10*3/uL — ABNORMAL HIGH (ref 4.0–10.5)
nRBC: 0 % (ref 0.0–0.2)

## 2019-05-05 LAB — BASIC METABOLIC PANEL
Anion gap: 10 (ref 5–15)
BUN: 8 mg/dL (ref 8–23)
CO2: 19 mmol/L — ABNORMAL LOW (ref 22–32)
Calcium: 8.2 mg/dL — ABNORMAL LOW (ref 8.9–10.3)
Chloride: 110 mmol/L (ref 98–111)
Creatinine, Ser: 0.82 mg/dL (ref 0.44–1.00)
GFR calc Af Amer: >60 mL/min (ref >60)
GFR calc non Af Amer: >60 mL/min (ref >60)
Glucose, Bld: 121 mg/dL — ABNORMAL HIGH (ref 70–99)
Potassium: 4 mmol/L (ref 3.5–5.1)
Sodium: 139 mmol/L (ref 135–145)

## 2019-05-05 MED ORDER — GABAPENTIN 600 MG PO TABS
600.0000 mg | ORAL_TABLET | Freq: Three times a day (TID) | ORAL | Status: DC
  Administered 2019-05-05 – 2019-05-10 (×16): 600 mg via ORAL
  Filled 2019-05-05 (×16): qty 1

## 2019-05-05 MED ORDER — SODIUM CHLORIDE 0.9 % IV BOLUS
500.0000 mL | Freq: Once | INTRAVENOUS | Status: AC
  Administered 2019-05-05: 14:00:00 500 mL via INTRAVENOUS

## 2019-05-05 MED ORDER — CYCLOBENZAPRINE HCL 10 MG PO TABS
5.0000 mg | ORAL_TABLET | Freq: Three times a day (TID) | ORAL | Status: DC
  Administered 2019-05-05 – 2019-05-10 (×15): 5 mg via ORAL
  Filled 2019-05-05 (×16): qty 1

## 2019-05-05 MED ORDER — ALBUMIN HUMAN 25 % IV SOLN
25.0000 g | Freq: Once | INTRAVENOUS | Status: AC
  Administered 2019-05-05: 25 g via INTRAVENOUS
  Filled 2019-05-05: qty 100

## 2019-05-05 MED ORDER — CHLORHEXIDINE GLUCONATE CLOTH 2 % EX PADS
6.0000 | MEDICATED_PAD | Freq: Every day | CUTANEOUS | Status: DC
  Administered 2019-05-05 – 2019-05-10 (×6): 6 via TOPICAL

## 2019-05-05 MED ORDER — KETOROLAC TROMETHAMINE 15 MG/ML IJ SOLN
15.0000 mg | Freq: Four times a day (QID) | INTRAMUSCULAR | Status: AC
  Administered 2019-05-05 – 2019-05-10 (×19): 15 mg via INTRAVENOUS
  Filled 2019-05-05 (×20): qty 1

## 2019-05-05 NOTE — Progress Notes (Signed)
POD # 1 AVSS pain is the main issue Good UO  PE NAD Abd: soft, dressing intact. incisiona c/d/i. JP serous. No peritonitis Ostomy pink, some output A/ p Overall doing well main issue is pain Increase gabapentin , add flexeril Keep clears Mobilize DC foley

## 2019-05-05 NOTE — Progress Notes (Signed)
Talking with patient and observed her having a fixed, open eyed stare and unresponsive for approx 3-5 seconds. Patient became responsive again and completely alert and oriented. Didn't observe any facial drooping/facial asymetry, dysphasia or any other symptoms. She stated she felt like she was blacking out for a moment. Paged Dr. Dahlia Byes who ordered me to hold the Oxycodone I was going to give for pain and obtain vital signs. BP soft at 100/60 (she received bolus earlier today) and other VS all WNL. Will continue to monitor.

## 2019-05-06 LAB — BASIC METABOLIC PANEL
Anion gap: 9 (ref 5–15)
BUN: 13 mg/dL (ref 8–23)
CO2: 20 mmol/L — ABNORMAL LOW (ref 22–32)
Calcium: 8.4 mg/dL — ABNORMAL LOW (ref 8.9–10.3)
Chloride: 109 mmol/L (ref 98–111)
Creatinine, Ser: 0.79 mg/dL (ref 0.44–1.00)
GFR calc Af Amer: >60 mL/min (ref >60)
GFR calc non Af Amer: >60 mL/min (ref >60)
Glucose, Bld: 73 mg/dL (ref 70–99)
Potassium: 3.5 mmol/L (ref 3.5–5.1)
Sodium: 138 mmol/L (ref 135–145)

## 2019-05-06 MED ORDER — LEVOTHYROXINE SODIUM 175 MCG PO TABS
175.0000 ug | ORAL_TABLET | Freq: Every day | ORAL | Status: DC
  Administered 2019-05-07 – 2019-05-10 (×4): 175 ug via ORAL
  Filled 2019-05-06 (×4): qty 1

## 2019-05-06 MED ORDER — PANTOPRAZOLE SODIUM 40 MG PO TBEC
40.0000 mg | DELAYED_RELEASE_TABLET | Freq: Every day | ORAL | Status: DC
  Administered 2019-05-06 – 2019-05-08 (×3): 40 mg via ORAL
  Filled 2019-05-06 (×4): qty 1

## 2019-05-06 NOTE — Progress Notes (Signed)
POD # 2 AVSS pain is better Taking clears and is hungry Good UO Labs ok  PE NAD Abd: soft, dressing intact. incisiona c/d/i. JP serous. No peritonitis Ostomy pink with output  A/ P Overall doing well  Pain better control Keep a/bs  Full liquids Mobilize

## 2019-05-06 NOTE — Progress Notes (Signed)
The patient is receiving Protonix by the intravenous route.  Based on criteria approved by the Pharmacy and [place], the medication is being converted to the equivalent oral dose form.  These criteria include: -No active GI bleeding -Able to tolerate diet of full liquids (or better) or tube feeding -Able to tolerate other medications by the oral or enteral route  Julie Arias, [facility] 05/06/2019

## 2019-05-07 MED ORDER — ALUM & MAG HYDROXIDE-SIMETH 200-200-20 MG/5ML PO SUSP
30.0000 mL | Freq: Four times a day (QID) | ORAL | Status: DC | PRN
  Administered 2019-05-07: 30 mL via ORAL
  Filled 2019-05-07: qty 30

## 2019-05-07 NOTE — Consult Note (Addendum)
[place] Nurse ostomy consult note Stoma type/location:  Ileostomy surgery performed on 10/2.   Stomal assessment/size: Stoma is red and viable, 1/3/4 inch oval, slightly above skin level, red rubber rod is in place.  Peristomal assessment: intact skin surrounding Output: Small amt brown liquid stool Ostomy pouching: 2pc.  Education provided:  Applied barrier ring to attempt to maintain a seal and 2 piece pouching system.  Pt assisted and asked appropriate questions.  She was able to open and close velcro to empty.  Educational materials and 5 extra sets of barrier rings and pouches ordered to the bedside.  Discussed dietary precautions and avoiding dehydration. Reviewed pouching routines and ordering supplies. [place] team will continue to follow for further teaching sessions.  Enrolled patient in [place] program: Yes Julien Girt MSN, RN, [place], [place], [place]

## 2019-05-07 NOTE — Care Management Important Message (Signed)
Important Message  Patient Details  Name: Julie Arias MRN: [id number] Date of Birth: [birth year]   Medicare Important Message Given:  Yes     Dannette Barbara 05/07/2019, 11:07 AM

## 2019-05-07 NOTE — Progress Notes (Signed)
[place] Hospital Day(s): 5.   Post op day(s): 3 Days Post-Op.   Interval History: Patient seen and examined, no acute events or new complaints overnight. Patient reports she had a significant episode of nausea and emesis this morning. Pain better controlled. She is having ileostomy function now (~400 ccs). JP with 220 ccs out. Mobilizing well.    Vital signs in last 24 hours: [min-max] current  Temp:  [98.5 F (36.9 C)-99.3 F (37.4 C)] 98.5 F (36.9 C) (10/05 0512) Pulse Rate:  [83-93] 83 (10/05 0512) Resp:  [18-20] 18 (10/05 0512) BP: (122-142)/(61-82) 134/82 (10/05 0512) SpO2:  [97 %-100 %] 97 % (10/05 0512)     Height: 5\' 4"  (162.6 cm) Weight: 65 kg BMI (Calculated): 24.57   Intake/Output last 2 shifts:  10/04 0701 - 10/05 0700 In: 2239.3 [I.V.:2064.6; IV Piggyback:174.7] Out: 3020 [Urine:2400; Drains:220; Stool:400]   Physical Exam:  Constitutional: alert, cooperative and no distress  Respiratory: breathing non-labored at rest  Cardiovascular: regular rate and sinus rhythm  Gastrointestinal: soft, incisional tenderness, and non-distended. No rebound/guarding. JP in LLQ with serosanguinous output. Ileostomy in right mid-abdomen, patent, gas and stool in bag.  Integumentary: Laparotomy incision is CDI with staples, some drainage on honeycomb.     Labs:  CBC Latest Ref Rng & Units 05/05/2019 05/04/2019 05/02/2019  WBC 4.0 - 10.5 K/uL 13.0(H) 3.8(L) 6.4  Hemoglobin 12.0 - 15.0 g/dL 12.8 13.0 15.4(H)  Hematocrit 36.0 - 46.0 % 37.7 37.8 44.9  Platelets 150 - 400 K/uL 177 149(L) 173   CMP Latest Ref Rng & Units 05/06/2019 05/05/2019 05/04/2019  Glucose 70 - 99 mg/dL 73 121(H) 80  BUN 8 - 23 mg/dL 13 8 6(L)  Creatinine 0.44 - 1.00 mg/dL 0.79 0.82 0.63  Sodium 135 - 145 mmol/L 138 139 141  Potassium 3.5 - 5.1 mmol/L 3.5 4.0 3.5  Chloride 98 - 111 mmol/L 109 110 110  CO2 22 - 32 mmol/L 20(L) 19(L) 23  Calcium 8.9 - 10.3 mg/dL 8.4(L)  8.2(L) 8.6(L)  Total Protein 6.5 - 8.1 g/dL - - -  Total Bilirubin 0.3 - 1.2 mg/dL - - -  Alkaline Phos 38 - 126 U/L - - -  AST 15 - 41 U/L - - -  ALT 0 - 44 U/L - - -    Imaging studies: No new pertinent imaging studies   Assessment/Plan:  68 y.o. female with with nausea and emesis which likely represents early post-surgical ileus although she is having bowel function now 3 Days Post-Op s/p laparoscopic LAR with diverting loop ileostomy for recurrent diverticulitis.   - Back down to CLD + IVF (75 ml/hr)   - Continue IV ABx (Zosyn --> Day 5)  - pain control prn (minimize narcotics); antiemetics prn  - monitor abdominal examination; on-going bowel function   - WOC RN consult  - mobilization encouraged   - medical management of comorbidities    All of the above findings and recommendations were discussed with the patient, and the medical team, and all of patient's questions were answered to her expressed satisfaction.  -- Edison Simon, PA-C [facility] 05/07/2019, 10:43 AM [phone] M-F: 7am - 4pm

## 2019-05-08 LAB — BASIC METABOLIC PANEL
Anion gap: 11 (ref 5–15)
BUN: 16 mg/dL (ref 8–23)
CO2: 22 mmol/L (ref 22–32)
Calcium: 8.2 mg/dL — ABNORMAL LOW (ref 8.9–10.3)
Chloride: 108 mmol/L (ref 98–111)
Creatinine, Ser: 0.63 mg/dL (ref 0.44–1.00)
GFR calc Af Amer: >60 mL/min (ref >60)
GFR calc non Af Amer: >60 mL/min (ref >60)
Glucose, Bld: 82 mg/dL (ref 70–99)
Potassium: 3.5 mmol/L (ref 3.5–5.1)
Sodium: 141 mmol/L (ref 135–145)

## 2019-05-08 LAB — SURGICAL PATHOLOGY

## 2019-05-08 NOTE — Progress Notes (Signed)
[place] [place] Hospital Day(s): 6.   Post op day(s): 4 Days Post-Op.   Interval History: Patient seen and examined, no acute events or new complaints overnight. Patient reports she is feeling much better this morning. She had episodes of emesis yesterday which resolved. This morning, improving abdominal pain, no fever, chills, nausea, or emesis. Her ileostomy output was ~1L yesterday. Mobilizing well. No other issues this morning.     Vital signs in last 24 hours: [min-max] current  Temp:  [98.2 F (36.8 C)-98.9 F (37.2 C)] 98.2 F (36.8 C) (10/06 0458) Pulse Rate:  [83-96] 83 (10/06 0458) Resp:  [16-18] 16 (10/06 0458) BP: (118-147)/(65-94) 129/65 (10/06 0458) SpO2:  [94 %-96 %] 96 % (10/06 0458)     Height: 5\' 4"  (162.6 cm) Weight: 65 kg BMI (Calculated): 24.57   Intake/Output last 2 shifts:  10/05 0701 - 10/06 0700 In: 2064.2 [P.O.:390; I.V.:1544.8; IV Piggyback:129.4] Out: 1221 [Urine:150; Emesis/NG output:1; Drains:20; Stool:1050]   Physical Exam:  Constitutional: alert, cooperative and no distress  Respiratory: breathing non-labored at rest  Cardiovascular: regular rate and sinus rhythm  Gastrointestinal: soft, incisional tenderness, and non-distended. No rebound/guarding. JP in LLQ with serosanguinous output. Ileostomy in right mid-abdomen, patent, gas and stool in bag.  Integumentary: Laparotomy incision is CDI with staples, no erythema or drainage.    Labs:  CBC Latest Ref Rng & Units 05/05/2019 05/04/2019 05/02/2019  WBC 4.0 - 10.5 K/uL 13.0(H) 3.8(L) 6.4  Hemoglobin 12.0 - 15.0 g/dL 12.8 13.0 15.4(H)  Hematocrit 36.0 - 46.0 % 37.7 37.8 44.9  Platelets 150 - 400 K/uL 177 149(L) 173   CMP Latest Ref Rng & Units 05/08/2019 05/06/2019 05/05/2019  Glucose 70 - 99 mg/dL 82 73 121(H)  BUN 8 - 23 mg/dL 16 13 8   Creatinine 0.44 - 1.00 mg/dL 0.63 0.79 0.82  Sodium 135 - 145 mmol/L 141 138 139  Potassium 3.5 - 5.1 mmol/L 3.5 3.5 4.0   Chloride 98 - 111 mmol/L 108 109 110  CO2 22 - 32 mmol/L 22 20(L) 19(L)  Calcium 8.9 - 10.3 mg/dL 8.2(L) 8.4(L) 8.2(L)  Total Protein 6.5 - 8.1 g/dL - - -  Total Bilirubin 0.3 - 1.2 mg/dL - - -  Alkaline Phos 38 - 126 U/L - - -  AST 15 - 41 U/L - - -  ALT 0 - 44 U/L - - -    Imaging studies: No new pertinent imaging studies   Assessment/Plan:  69 y.o. female with resolved nausea/emesis likely related to now resolved ileus 4 Days Post-Op s/p laparoscopic LAR with diverting loop ileostomy for recurrent diverticulitis.   - Advance to full liquids, soft for dinner if tolerated   - Continue IVF for today to ensure hydration with high output from ileostomy  - pain control prn;l antiemetics prn  - monitor abdominal examination; ileostomy function  - Conitnue JP for now; hopefully out before discharge  - Check BMP  - Continue mobilizing  - medical management of comorbidities  All of the above findings and recommendations were discussed with the patient, and the medical team, and all of patient's questions were answered to her expressed satisfaction.  -- Edison Simon, PA-C [facility] 05/08/2019, 9:05 AM [phone] M-F: 7am - 4pm

## 2019-05-09 MED ORDER — LOPERAMIDE HCL 2 MG PO CAPS
2.0000 mg | ORAL_CAPSULE | Freq: Two times a day (BID) | ORAL | Status: DC
  Administered 2019-05-09 – 2019-05-10 (×3): 2 mg via ORAL
  Filled 2019-05-09 (×3): qty 1

## 2019-05-09 MED ORDER — PANTOPRAZOLE SODIUM 40 MG PO TBEC
80.0000 mg | DELAYED_RELEASE_TABLET | Freq: Every day | ORAL | Status: DC
  Administered 2019-05-09: 80 mg via ORAL
  Filled 2019-05-09: qty 2

## 2019-05-09 NOTE — Consult Note (Signed)
[place] Nurse ostomy consult note Stoma type/location: RLQ Ileostomy  Red retention rubber catheter in place.   Stomal assessment/size: 1 1/2" pink patent and producing liquid green stool  Due to high output, starting immodium Peristomal assessment: intact Treatment options for stomal/peristomal skin: Barrier ring  Convex pouch Output liquid green stool  High output   Ostomy pouching: 1pc.convex Education provided:  Pouch change performed.  Patient assisted with cutting and applying pouch.  Explained rationale for convexity, twice weekly pouch changes.  Enrolled patient in [place] program: Yes [place] team will follow.  Domenic Moras MSN, RN, FNP-BC CWON Wound, Ostomy, Continence Nurse Pager [phone]

## 2019-05-09 NOTE — Progress Notes (Addendum)
Julie Arias Day(s): 7.   Post op day(s): 5 Days Post-Op.   Interval History: Patient seen and examined, no acute events or new complaints overnight. Patient reports that she is feeling better this morning. Her abdominal pain is improving significantly. No fever, chills, nausea, or emesis. She continues to have high output from ileostomy (~1L per day) which is liquid stool. She is tolerating soft diet without issue. No other complaints.   Vital signs in last 24 hours: [min-max] current  Temp:  [98.3 F (36.8 C)-98.8 F (37.1 C)] 98.8 F (37.1 C) (10/07 0504) Pulse Rate:  [78-96] 78 (10/07 0504) Resp:  [16-20] 20 (10/07 0504) BP: (133-146)/(78-94) 146/78 (10/07 0504) SpO2:  [95 %-97 %] 96 % (10/07 0504)     Height: 5\' 4"  (162.6 cm) Weight: 65 kg BMI (Calculated): 24.57   Intake/Output last 2 shifts:  10/06 0701 - 10/07 0700 In: 1848.5 [P.O.:360; I.V.:1387.6; IV Piggyback:100.8] Out: 1325 [Urine:400; Drains:25; Stool:900]   Physical Exam:  Constitutional: alert, cooperative and no distress  Respiratory: breathing non-labored at rest  Cardiovascular: regular rate and sinus rhythm  Gastrointestinal: soft,incisional tenderness is improving, and non-distended. No rebound/guarding. JP in LLQ with serosanguinous output. Ileostomy in right mid-abdomen, patent, gas and liquid stool in bag, red rubber in place. Integumentary:Laparotomy incision is CDI with staples, no erythema or drainage.   Labs:  CBC Latest Ref Rng & Units 05/05/2019 05/04/2019 05/02/2019  WBC 4.0 - 10.5 K/uL 13.0(H) 3.8(L) 6.4  Hemoglobin 12.0 - 15.0 g/dL 12.8 13.0 15.4(H)  Hematocrit 36.0 - 46.0 % 37.7 37.8 44.9  Platelets 150 - 400 K/uL 177 149(L) 173   CMP Latest Ref Rng & Units 05/08/2019 05/06/2019 05/05/2019  Glucose 70 - 99 mg/dL 82 73 121(H)  BUN 8 - 23 mg/dL 16 13 8   Creatinine 0.44 - 1.00 mg/dL 0.63 0.79 0.82  Sodium 135 - 145 mmol/L 141 138 139  Potassium 3.5  - 5.1 mmol/L 3.5 3.5 4.0  Chloride 98 - 111 mmol/L 108 109 110  CO2 22 - 32 mmol/L 22 20(L) 19(L)  Calcium 8.9 - 10.3 mg/dL 8.2(L) 8.4(L) 8.2(L)  Total Protein 6.5 - 8.1 g/dL - - -  Total Bilirubin 0.3 - 1.2 mg/dL - - -  Alkaline Phos 38 - 126 U/L - - -  AST 15 - 41 U/L - - -  ALT 0 - 44 U/L - - -     Imaging studies: No new pertinent imaging studies   Assessment/Plan:  68 y.o. female overall doing well whose biggest issue is high output from ileostomy 5 Days Post-Op s/p laparoscopic LAR with diverting loop ileostomyforrecurrent diverticulitis.   - Continue soft diet as tolerates  - Discontinue IVF; encouraged to drink plenty of fluids to maintain hydration   - Will start on Imodium 2mg  BID to help slow down and thicken up ostomy output   - pain control prn;l antiemetics prn             - monitor abdominal examination; ileostomy function             - Conitnue JP for now; serous; hopefully out before discharge             - Appreciate WOC RN assistance with ileostomy management             - Continue mobilizing             - medical management of comorbidities    Discharge planning:  Once ostomy output better controlled, hopefully home in 24-48 hours  All of the above findings and recommendations were discussed with the patient, and the medical team, and all of patient's questions were answered to her expressed satisfaction.  -- Edison Simon, PA-C [facility] 05/09/2019, 10:17 AM [phone] M-F: 7am - 4pm

## 2019-05-10 MED ORDER — CYCLOBENZAPRINE HCL 5 MG PO TABS: 5.0000 mg | ORAL_TABLET | Freq: Three times a day (TID) | ORAL | 0 refills | Status: DC

## 2019-05-10 MED ORDER — OXYCODONE HCL 5 MG PO TABS: 5.0000 mg | ORAL_TABLET | ORAL | 0 refills | Status: DC | PRN

## 2019-05-10 MED ORDER — LOPERAMIDE HCL 2 MG PO CAPS: 2.0000 mg | ORAL_CAPSULE | Freq: Two times a day (BID) | ORAL | 0 refills | Status: DC

## 2019-05-10 MED ORDER — ONDANSETRON HCL 4 MG PO TABS: 4.0000 mg | ORAL_TABLET | Freq: Three times a day (TID) | ORAL | 0 refills | Status: DC | PRN

## 2019-05-10 NOTE — Discharge Instructions (Signed)
Continue to take IMODIUM 2mg  TWICE DAILY.... If you notice significant decrease in output, or output stops, decrease the amount of IMODIUM you are taking If you notice an increase in output or it becomes watery, increase the IMODIUM to 2mg  THREE TIMES DAILY  The goal is for the ileostomy output to be semi-solid to thick liquid.  Make sure to maintain adequate hydration. Supplement with Gatorade and Pedialyte

## 2019-05-10 NOTE — Progress Notes (Signed)
Julie Arias is A & O, VSS IV removed and intact. Discharge instructions and prescriptions given to patient who states understanding. Patient transported out via wheelchair with NT

## 2019-05-10 NOTE — Plan of Care (Signed)
Patient's stool is starting to thicken up in ostomy bag. Will continue to monitor. Christene Slates

## 2019-05-10 NOTE — Care Management Important Message (Signed)
Important Message  Patient Details  Name: Julie Arias MRN: [id number] Date of Birth: 10/29/1950   Medicare Important Message Given:  Yes     Dannette Barbara 05/10/2019, 1:35 PM

## 2019-05-10 NOTE — Discharge Summary (Signed)
[facility] SURGICAL ASSOCIATES SURGICAL DISCHARGE SUMMARY  Patient ID: Julie Arias MRN: [id number] DOB/AGE: [birth year] 68 y.o.  Admit date: 05/02/2019 Discharge date: 05/10/2019  Discharge Diagnoses Patient Active Problem List   Diagnosis Date Noted  . Diverticulitis large intestine 05/02/2019  . Acute diverticulitis 02/04/2019   Consultants None  Procedures 05/04/2019:  1. Laparoscopic lysis of adhesions 2. Laparoscopic Low anterior resection including Left colectomy 3. Laparoscopic takedown of splenic flexure 4. Diverting loop ileosotomy  HPI: Francesca Jewett is a 68 year old well-known to me with a prior history of recurrent diverticulitis. She did have her colonoscopy showing only a small polyp and diverticular disease I have personally reviewed the images. No evidence or concern for malignancy. She reports that over the last 2 or 3 days her pain started back is located in the left lower quadrant is sharp intermittent worsening with movement. She does have decreased appetite and some nausea. No vomiting. No fevers no chills. She still able to keep p.o. intake  Hospital Course: She was admitted to [facility] under the general surgery service on 09/30 and underwent CT scan which confirmed recurrent diverticulitis without perforation or abscess. She was started on IV ABx. Given the fact that this was recurrent, multiple times this year, it was decided that she would benefit from sigmoid colectomy this admission. The plan was to cool her off with IV ABx for 48 hours prior to going to the OR with the possibility of diverting ileostomy to protect an anastomosis vs Hartman's. Informed consent was obtained and documented, and patient underwent uneventful sigmoid colectomy with diverting ileostomy (Dr Dahlia Byes, 05/04/2019).  Post-operatively, patient had a lot of issues with pain management which took about 48 hours to get a hold of. She initially tolerated a diet but on POD3 she developed an ileus.  This resolved in 24 hours and she had high output from ileostomy the next 48 hours. She was started on 2 mg Imodium on POD5 which slowed and thickened output. Advancement of patient's diet and ambulation were well-tolerated. The remainder of patient's hospital course was essentially unremarkable, and discharge planning was initiated accordingly with patient safely able to be discharged home with appropriate discharge instructions, antibiotics (Augmentin x5 days to complete 14 days post-op), pain control, and outpatient follow-up after all of her questions were answered to her expressed satisfaction.   Discharge Condition: Good   Physical Examination:  Constitutional: alert, cooperative and no distress  Respiratory: breathing non-labored at rest  Cardiovascular: regular rate and sinus rhythm  Gastrointestinal: soft,incisional tenderness is improving, and non-distended. No rebound/guarding. JP in LLQ with serosanguinous output. Ileostomy in right mid-abdomen, patent, gas and liquid stool in bag, red rubber in place (will remove). Integumentary:Laparotomy incision is CDI with staples,no erythema or drainage.    Allergies as of 05/10/2019      Reactions   Aleve [naproxen Sodium] Rash      Medication List    STOP taking these medications   ciprofloxacin 500 MG tablet Commonly known as: Cipro   metroNIDAZOLE 500 MG tablet Commonly known as: FLAGYL     TAKE these medications   cyclobenzaprine 5 MG tablet Commonly known as: FLEXERIL Take 1 tablet (5 mg total) by mouth 3 (three) times daily.   Euthyrox 175 MCG tablet Generic drug: levothyroxine Take 175 mcg by mouth daily before breakfast.   ibuprofen 200 MG tablet Commonly known as: ADVIL Take 400 mg by mouth 2 (two) times daily as needed for headache.   loperamide 2 MG capsule Commonly known as: IMODIUM  Take 1 capsule (2 mg total) by mouth 2 (two) times daily.   LORazepam 0.5 MG tablet Commonly known as: ATIVAN Take 0.5  mg by mouth at bedtime as needed for anxiety or sleep.   omeprazole 40 MG capsule Commonly known as: PRILOSEC Take 40 mg by mouth daily.   ondansetron 4 MG tablet Commonly known as: Zofran Take 1 tablet (4 mg total) by mouth every 8 (eight) hours as needed for nausea or vomiting.   oxyCODONE 5 MG immediate release tablet Commonly known as: Oxy IR/ROXICODONE Take 1 tablet (5 mg total) by mouth every 4 (four) hours as needed for severe pain or breakthrough pain.   traMADol 50 MG tablet Commonly known as: ULTRAM Take 50 mg by mouth every 6 (six) hours as needed.   valACYclovir 1000 MG tablet Commonly known as: VALTREX Take 1,000 mg by mouth 2 (two) times daily as needed (cold sores).        Follow-up Information    Pabon, [place] F, MD. Schedule an appointment as soon as possible for a visit in 1 week(s).   Specialty: General Surgery Why: s/p sigmoid colectomy with diverting ileostomy....follow up next week for staple removal...okay to see Thedore Mins PA Contact information: [address] [place] [address] [phone]            Time spent on discharge management including discussion of hospital course, clinical condition, outpatient instructions, prescriptions, and follow up with the patient and members of the medical team: >30 minutes  -- Edison Simon , PA-C [facility]  05/10/2019, 9:56 AM [phone] M-F: 7am - 4pm

## 2019-05-10 NOTE — Consult Note (Signed)
[place] Nurse ostomy follow up Patient is discharging today.  She will need the following supplies for discharge and I have ordered these. Supply list:  1 piece ostomy Roselee Culver # [id number]  Needs 4 Barrier ring  Kellie Simmering # [phone]  Needs 4 Ostomy belt  [place] # [id number]    Please send home with her.  Patient aware of secure start program and will be looking for starter kit as well.  Will not follow at this time.  Please re-consult if needed.  Domenic Moras MSN, RN, FNP-BC CWON Wound, Ostomy, Continence Nurse Pager [phone]

## 2019-05-14 ENCOUNTER — Telehealth: Payer: Self-pay | Admitting: *Deleted

## 2019-05-14 ENCOUNTER — Ambulatory Visit: Payer: Self-pay | Admitting: Surgery

## 2019-05-14 NOTE — Telephone Encounter (Signed)
Spoke with Dr.Pabon regarding patient's concern. Per Dr.Pabon, notified me to inform the patient she may increase the Imodium to four times a day.

## 2019-05-14 NOTE — Telephone Encounter (Signed)
Patient stated she had surgery on 05/04/19 Colon Rescection/ has colostomy bag with Dr.pabon, she stated that the stool is still very watery. She is taking Imodium twice daily. Patient wanted to know if she should increase the Imodium since her stool is still watery. Please call and advise

## 2019-05-16 ENCOUNTER — Ambulatory Visit (INDEPENDENT_AMBULATORY_CARE_PROVIDER_SITE_OTHER): Payer: Medicare HMO | Admitting: Physician Assistant

## 2019-05-16 ENCOUNTER — Other Ambulatory Visit: Payer: Self-pay

## 2019-05-16 ENCOUNTER — Encounter: Payer: Self-pay | Admitting: Physician Assistant

## 2019-05-16 ENCOUNTER — Encounter: Payer: Self-pay | Admitting: *Deleted

## 2019-05-16 ENCOUNTER — Other Ambulatory Visit: Payer: Self-pay | Admitting: *Deleted

## 2019-05-16 VITALS — BP 158/93 | HR 81 | Temp 97.6°F | Resp 14 | Ht 64.0 in | Wt 140.4 lb

## 2019-05-16 DIAGNOSIS — Z09 Encounter for follow-up examination after completed treatment for conditions other than malignant neoplasm: Secondary | ICD-10-CM

## 2019-05-16 DIAGNOSIS — K5732 Diverticulitis of large intestine without perforation or abscess without bleeding: Secondary | ICD-10-CM

## 2019-05-16 MED ORDER — ONDANSETRON HCL 4 MG PO TABS: 4.0000 mg | ORAL_TABLET | Freq: Three times a day (TID) | ORAL | 0 refills | Status: DC | PRN

## 2019-05-16 NOTE — Patient Instructions (Signed)
We will set you up for a home care visit for your ostomy.  We have refilled your Zofran.  Follow up with Dr Dahlia Byes in one month.   Please call and ask to speak with a nurse if you develop questions or concerns.

## 2019-05-16 NOTE — Progress Notes (Signed)
[facility] SURGICAL ASSOCIATES POST-OP OFFICE VISIT  05/16/2019  HPI: Julie Arias is a 68 y.o. female 12 days s/p laparoscopic sigmoid colectomy for recurrent diverticulitis with Dr Dahlia Byes.   Today, she reports that she is doing well. She has some abdominal soreness. Nausea intermittently. No fever, chills. Having good ileostomy function, taking imodium 4 times a day. No other complaints.   Vital signs: BP (!) 158/93   Pulse 81   Temp 97.6 F (36.4 C)   Resp 14   Ht 5\' 4"  (1.626 m)   Wt 140 lb 6.4 oz (63.7 kg)   SpO2 96%   BMI 24.10 kg/m    Physical Exam: Constitutional: Well appearing female, NAD Abdomen: Soft, incisional soreness, non-distended. Ileostomy in RLQ, semi-thick stool and gas in bag, red rubber removed.  Skin: Laparotomy incision is CDI with staples (removed), no erythema or drainage, surrounding ecchymosis  Assessment/Plan: This is a 68 y.o. female 12 days s/p laparoscopic sigmoid colectomy for recurrent diverticulitis   - pain control prn  - will refill Zofran for antiemetics  - Continue imodium for ileostomy output management; reviewed titration  - Will look into [facility] for ileostomy management and why she has not received services  - continue lifting restrictions  - follow up in 1 month with Dr Dahlia Byes  -- Edison Simon, PA-C [facility] 05/16/2019, 10:42 AM [phone] M-F: 7am - 4pm

## 2019-05-16 NOTE — Patient Outreach (Signed)
[facility]) Care Management [facility] CM Telephone Outreach, EMMI Red alert- General Discharge PCP completes Transition of Care follow up post-hospital discharge Post-hospital discharge day # 6 Patient engaged for ongoing [place] services  05/16/2019  Julie Arias Feb 15, 1951 [id number]  EMMI- General Discharge RED ON EMMI ALERT: sad/ hopeless/ empty EMMI call day # 4/ Date: 05/15/2019  Successful telephone outreach to Julie Arias, referred to [facility] CM after EMMI red- alert received for general discharge/ sad/ depressed/ hopeless/ empty.  Patient had recent hospitalization September 30- May 10, 2019 for diverticulitis of large intestine/ colon with hemorrhage; patient had surgical colectomy with new ostomy placement.  Patient was discharged home to self-care without home health services in place.  Patient has history including, but not limited to, GERD; diverticulitis; hypothyroidism; and anxiety.  Purpose of call discussed with patient and she recalls receiving EMMI automated call yesterday; patient stated that she did answer yes to question, because she was initially very sad and depressed feeling when she was released from the hospital, but she assures me that "this is completely gone" now; states that "every day has gotten better" since she has been at home.  Depression screening completed, no concerns identified.  Patient reports that she attended post-hospital discharge surgical follow up appointment this morning and that her staples were removed; stated that the PA has now ordered home health services, as patient has had a difficult time managing her new colostomy at home, due to not being able to see it well.  Reports that she has a granddaughter who is an LPN that has been helping her, but added that it is inconvenient, as her granddaughter lives an hour away from her.  Stated that one night, her colostomy bag came off unexpectedly and her  granddaughter had to come to her home in the middle of the night to fix the problem/ replace the bag.  Patient stated that she did not feel adequately prepared to care for her new colostomy at the time she was discharged home from the hospital.  [facility] CM services were discussed with patient, as a free benefit of her health insurance; patient is agreeable to [facility] CM services and provides verbal consent for [facility] CM services.  Patient also provides verbal consent to speak with her husband if needed, "at any time."  Patient further reports:  Medications: -- Has all medicationsand takes as prescribed;denies questions/ concerns around current medications, is currently at pharmacy obtaining prescription refill.  -- patient declines medication review today, as she is at the outpatient pharmacy; agrees to medication review at time of next outreach  Provider appointments: -- All recent/ upcoming provider appointments reviewed with patient today -- no scheduled PCP office visit yet; patient was encouraged to make sure that she schedules this appointment soon; states she has contacted her PCP office and was told they would call her back to schedule; so far patient reports that she has not yet heard back form PCP-- encouraged patient to proactively contact PCP again if she does not hear back by tomorrow: discussed patient plans to obtain flu shot at PCP office visit for this flu season  Safety/ Mobility/ Falls: -- denies new/ recent falls: has had no falls over last year -- assistive devices: none -- general fall risks/ prevention education discussed with patient today  Social/ Community Resource needs: -- currently denies community resource needs, stating supportive family members that assist with care needs as indicated -- patient is independent in all ADL/ iADL's --  family able to provide transportation for patient to all provider appointments, errands, etc-- however, patient has already resumed driving  short distances and 'feels good" about it -- SDOH completed for: depression/ financial resource strain/ transportation/ food insecurity: no concerns identified  Advanced Directive (AD) Planning:   --reports does currently have exisisting AD in place for living will and HCPOA; declines desire to make changes to either; endorses wish to be full code  Self-health management of post-op ostomy: -- reports long-time history of diverticulitis -- states looking forward to home health team helping her with understanding how to change ostomy bags- coached patient on what to expect with home health services/ need to establish services promptly; instructed patient to re-contact ordering provider promptly if she does not hear from home health agency within 48 hours, as patient reports the ordering provider did not specify which home health agency services were requested from -- states was instructed to change ostomy bag twice weekly, more if needed; confirms that bag was changed today by surgical provider team, and confirms that her LPN granddaughter was present to observe, should patient need assistance in the future -- confirms that surgeon plans to reverse ostomy "by the first of next year;" patient hopeful this will happen as planned -- confirms that she has supplies sent to her home from [place] "Secure start" program, which was initiated by hospital [place] team; states she has lots of supplies, but does not know what to do with everything she has been sent.  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  I provided/ confirmed that patient has my direct phone number, the main [facility] CM office phone number, and the [facility] CM 24-hour nurse advice phone number should issues arise prior to next scheduled [place] outreach.  Encouraged patient to contact me directly if needs, questions, issues, or concerns arise prior to next scheduled outreach; patient agreed to do so.  Plan:  Patient will take medications as prescribed and will attend all scheduled provider appointments  Patient will promptly schedule post-hospital discharge office visit with PCP  Patient will promptly notify care providers for any new concerns/ issues/ problems that arise  Patient will actively participate in home health services as ordered post-surgical provider appointment today  I will make patient's PCP aware of [place] RN CM involvement in patient's care-- will send barriers letter  [place] outreach to continue with scheduled phone call in 2 weeks   [facility] CM Care Plan Problem One     Most Recent Value  Care Plan Problem One  High risk for hospital readmission related to/ as evidenced by recent hospitalization September 30- May 10, 2019 for acute diverticulitis requiring surgical colectomy with newly placed ostomy  Role Documenting the Problem One  Care Management [place] for Problem One  Active  [facility] Long Term Goal   Over the next 31 days,  patient will not experience hospital readmission, as evidenced by patient reporting and review of EMR during [facility] RN CM outreach  [place] Term Goal Start Date  05/16/19  Interventions for Problem One Long Term Goal  Discussed with patient her current clinical condition and confirmed that she has no clinical  concerns,  initiated [facility] CM program and completed SDOH to assess patient care needs,  confirmed that patient attended post-hospital surgical provider office visit today  [facility] CM Short Term Goal #1   Over the next 30 days, patient will engage/ actively participate with home health services ordered today, as evidenced by patient reporting and collaboration with home health team as indicated during [place] outreach  [facility] CM Short Term Goal #1 Start Date  05/16/19  Interventions for Short Term Goal #1  Discussed process to have home health services initiated and confirmed that these services were ordered for patient today by surgeon,  discussed difference between home health and [facility] CM services and coached patient around talking points to cover when she is contacted by home health team,  confirmed that patient has ostomy supplies at her home ready for home health team,  encouraged patient's ongoing participation in home health services post-hospital discharge  [facility] CM Short Term Goal #2   Over the next 30 days, patient will schedule/ attend post-hospital discharge PCP office visit as evidenced by patient reporting and collaboration as indicated with PCP during [facility] RN CM outreach  [facility] CM Short Term Goal #2 Start Date  05/16/19  Interventions for Short Term Goal #2  Confirmed that patient has contacted PCP office to schedule appointment and is waiting for call back from PCP,  encouraged her to re-contact PCP promptly if she does not receive call back by end of week,  discussed value of prompt hospital discharge follow up with PCP, need for flu shot for 2020 flu season. Confirmed that patient does not need assistance in scheduling PCP appointment     I appreciate the opportunity to participate in [place] care,  [name], RN, BSN, [place] Coordinator [facility] Care Management  [phone]

## 2019-05-17 ENCOUNTER — Other Ambulatory Visit: Payer: Self-pay

## 2019-05-17 ENCOUNTER — Ambulatory Visit: Payer: Medicare HMO

## 2019-05-17 DIAGNOSIS — K5732 Diverticulitis of large intestine without perforation or abscess without bleeding: Secondary | ICD-10-CM

## 2019-05-17 NOTE — Progress Notes (Signed)
Patient ID: Julie Arias, female   DOB: 1950-11-01, 68 y.o.   MRN: [id number] Patient came in today for a wound check. She has a staple that needs removed. Staple removed, wound edges well approximated. The wound is clean, with no signs of infection noted. Follow up as scheduled.

## 2019-05-17 NOTE — Progress Notes (Signed)
[place]: [phone] Fax: [phone]   Patient: Julie Arias DOB: 28-Jan-1951 MRN: [id number]   Diagnosis: Diverticulitis of Colon, K57.32   To Whom This May Concern,  Please arrange home health care for ostomy care, assessment, and instruction.    If you have any questions or concerns, please don't hesitate to call.   Sincerely,     Dr Caroleen Hamman

## 2019-05-18 ENCOUNTER — Telehealth: Payer: Self-pay

## 2019-05-18 DIAGNOSIS — R300 Dysuria: Secondary | ICD-10-CM | POA: Diagnosis not present

## 2019-05-18 NOTE — Telephone Encounter (Signed)
[facility] called and states that they are out of network with the patient's insurance and that she has a $10,000 deductible for out of network care.  [place] and they will be able to send someone out to see her by Monday.

## 2019-05-21 ENCOUNTER — Telehealth: Payer: Self-pay | Admitting: Surgery

## 2019-05-21 NOTE — Telephone Encounter (Signed)
Julie Arias called with [facility] home health regarding this patient, she said the patient is homebound, and she is independent , and that she declines services. If you need any information she can be reached at [phone].

## 2019-05-22 ENCOUNTER — Telehealth: Payer: Self-pay | Admitting: *Deleted

## 2019-05-22 NOTE — Telephone Encounter (Signed)
Patient states a nurse from home health came out and was told since she is not homebound so therefore they can not assist. Her supplies come from [employer].   I spoke with Bonnetta Barry and patient is registered to start receiving supplies from [place]. Larene Beach stated she will call patient and answer any questions that she may have.  I spoke with patient -she states she is changing the bag every 2 days, no breakdown of skin, and 1 piece bag is adhering well.   Patient was instructed to call office if she experiences any skin breakdown, ulcerated skin of with any questions or concerns.

## 2019-05-22 NOTE — Telephone Encounter (Signed)
Patient called and stated that we had her set up with a home health agency to come out to help her but when they got there she said that they can not do anything because she is not homebound. Please call patient and advise

## 2019-05-24 ENCOUNTER — Telehealth: Payer: Self-pay | Admitting: *Deleted

## 2019-05-24 NOTE — Telephone Encounter (Signed)
Patient had surgery on 05/04/19 Dr.Pabon Sigmoid Colon Resection. Patient called and stated that she has a colostomy bag but today she has had 2 bowel movements today from the rectum instead of through the bag, She wants to know if its normal. The Bowel movement is apple sauce consistency. Please call and advise

## 2019-05-24 NOTE — Telephone Encounter (Signed)
Patient states that the discharge was yellow in color and a small amount but did have this twice. Patient assured that this is normal and may happen occasionally. Patient denies any pain or pressure feeling, no fever or chills.

## 2019-05-28 ENCOUNTER — Telehealth: Payer: Self-pay | Admitting: Surgery

## 2019-05-28 ENCOUNTER — Telehealth: Payer: Self-pay

## 2019-05-28 NOTE — Telephone Encounter (Signed)
Spoke with patient and patient was advised to call [facility] to let them know she has not received her colostomy supplies.

## 2019-05-28 NOTE — Telephone Encounter (Signed)
Patient is calling said she hasn't received any supplies for her bag and is needing the supplies please call patient and advise.

## 2019-05-28 NOTE — Telephone Encounter (Signed)
Spoke with Mariane Baumgarten and she states that patient is set up with  [facility] [phone].   Paulina states there is a note stating that a2 day supply will be sent to the patient to make up for the waiting so the patient does not run out of supplies.   [facility]- verified patients phone number and crystal will call patient now.

## 2019-05-30 ENCOUNTER — Other Ambulatory Visit: Payer: Self-pay | Admitting: *Deleted

## 2019-05-30 ENCOUNTER — Encounter: Payer: Self-pay | Admitting: *Deleted

## 2019-05-30 NOTE — Patient Outreach (Signed)
[facility]) [place] Telephone Outreach PCP office completes Transition of Care follow up post-hospital discharge Post-hospital discharge day # 20 Unsuccessful (consecutive) outreach attempt # 1- previously engaged patient  05/30/2019  [name] April 23, 1951 [id number]  2:10 pm:  Unsuccessful telephone outreach to [employer], referred to [facility] CM after EMMI red- alert received for general discharge/ sad/ depressed/ hopeless/ empty.  Patient had recent hospitalization September 30- May 10, 2019 for diverticulitis of large intestine/ colon with hemorrhage; patient had surgical colectomy with new ostomy placement.  Patient was discharged home to self-care without home health services in place.  Patient has history including, but not limited to, GERD; diverticulitis; hypothyroidism; and anxiety.  HIPAA compliant voice mail message left for patient, requesting return call back.  Plan:  Will re-attempt [employer] telephone outreach within 4 business days if I do not hear back from patient first.  [name], RN, BSN, [place] Coordinator [facility] Care Management  [phone]

## 2019-05-31 DIAGNOSIS — Z932 Ileostomy status: Secondary | ICD-10-CM | POA: Diagnosis not present

## 2019-06-01 ENCOUNTER — Encounter: Payer: Self-pay | Admitting: *Deleted

## 2019-06-01 ENCOUNTER — Other Ambulatory Visit: Payer: Self-pay | Admitting: *Deleted

## 2019-06-01 NOTE — Patient Outreach (Signed)
[facility]) [place] Telephone Outreach PCP office completes Transition of Care follow up post-hospital discharge Post-hospital discharge day # 22  06/01/2019  Julie Arias Sep 09, 1950 [id number]  Successful telephone outreach to [facility] "Julie Pons" Arias, referred to [facility] CM after EMMI red- alert received for general discharge; patient was subsequently engaged in [place] services after recent hospitalization September 30- May 10, 2019 for diverticulitis of large intestine/ colon with hemorrhage; patient had surgical colectomy with new ostomy placement.  Patient was discharged home to self-care without home health services in place.  Patient has history including, but not limited to, GERD; diverticulitis; hypothyroidism; and anxiety.  HIPAA/ identity verified with patient during phone call today.  Today, patient reports "things are going very good, finally," after she experienced difficulty obtaining her ostomy supplies; patient reports this is now resolved and that she "has everything needed" for her ongoing ostomy care.  Patient reports she is back at work and tolerating well; she denies pain and new/ recent falls.  Denies clinical concerns and medication concerns; declines full medication review due to time constraints while she is at work, and assures me that she "has all medications" and is taking as prescribed.  Patient reports that she "only has a few medications anyway."  Patient further reports: -- scheduled upcoming PCP appointment for next week; verbalizes plans to attend as scheduled and to obtain flu shot -- scheduled upcoming surgical provider office visit; verbalizes plans to attend -- continues driving self; remains independent in care, but does require assistance with ostomy care at home, which her husband and granddaughter are doing; continues to deny community resource needs -- decreased appetite which "comes and goes;" using  nutritional supplements -- skin around ostomy site, "looks good and healthy;" states ostomy care is going well at home; changing twice weekly as instructed -- has contact information for [employer] program  Patient denies further issues, concerns, or problems today. I confirmed that patient hasmy direct phone number, the main [facility] CM office phone number, and the [facility] CM 24-hour nurse advice phone number should issues arise prior to next scheduled [place] outreach after upcoming scheduled provider appointments.  Encouraged patient to contact me directly if needs, questions, issues, or concerns arise prior to next scheduled outreach; patient agreed to do so.  Plan:  Patient will take medications as prescribed and will attend all scheduled provider appointments  Patient will promptly notify care providers for any new concerns/ issues/ problems that arise  I will share today's notes/ care plan with patient's PCP as [facility] CM initial assessment  [place] outreach to continue with scheduled phone call next month post- upcoming scheduled provider appointments  [facility] CM Care Plan Problem One     Most Recent Value  Care Plan Problem One  High risk for hospital readmission related to/ as evidenced by recent hospitalization September 30- May 10, 2019 for acute diverticulitis requiring surgical colectomy with newly placed ostomy  Role Documenting the Problem One  Care Management Coordinator  Care Plan for Problem One  Active  THN Long Term Goal   Over the next 31 days, patient will not experience hospital readmission, as evidenced by patient reporting and review of EMR during [facility] RN CM outreach  [facility] Long Term Goal Start Date  05/16/19  Interventions for Problem One Long Term Goal  Discussed with patient her current clinical condition and confirmd that she does not have ongoing or current clinical concerns,  confirmed that patient is back at work and offered  medication review which patient declined assuring me that she has no medication concerns,  reviewed recent and upcoming provider communication and provider visits with patient  THN CM Short Term Goal #1   Over the next 30 days, patient will engage/ actively participate with home health services ordered today, as evidenced by patient reporting and collaboration with home health team as indicated during [facility] CM outreach  [facility] CM Short Term Goal #1 Start Date  05/16/19  [facility] CM Short Term Goal #1 Met Date  06/01/19 [Goal Met]  Interventions for Short Term Goal #1  Discussed home health visit with patient who reports that home health team is not involved due to patient being ambulatory and not home bound,  confirmed that patient has all supplies for ostomy and that her husband continues assisting in ostomy care  THN CM Short Term Goal #2   Over the next 30 days, patient will schedule/ attend post-hospital discharge PCP office visit as evidenced by patient reporting and collaboration as indicated with PCP during [facility] RN CM outreach  [facility] CM Short Term Goal #2 Start Date  05/16/19  Interventions for Short Term Goal #2  Confirmed that patient has scheduled PCP office visit for next week and plans to obtain flu vaccination at that time,  confirmed that patient has reliable transportation to all provider appointments and continues to drive self      Julie Rack, RN, BSN, [place] Coordinator [facility] Care Management  [phone]

## 2019-06-13 ENCOUNTER — Ambulatory Visit (INDEPENDENT_AMBULATORY_CARE_PROVIDER_SITE_OTHER): Payer: Medicare HMO | Admitting: Surgery

## 2019-06-13 ENCOUNTER — Other Ambulatory Visit: Payer: Self-pay | Admitting: Physical Medicine and Rehabilitation

## 2019-06-13 ENCOUNTER — Other Ambulatory Visit: Payer: Self-pay

## 2019-06-13 ENCOUNTER — Encounter: Payer: Self-pay | Admitting: Surgery

## 2019-06-13 VITALS — BP 151/84 | HR 70 | Temp 95.0°F | Ht 64.0 in | Wt 137.4 lb

## 2019-06-13 DIAGNOSIS — M1990 Unspecified osteoarthritis, unspecified site: Secondary | ICD-10-CM | POA: Diagnosis not present

## 2019-06-13 DIAGNOSIS — B029 Zoster without complications: Secondary | ICD-10-CM | POA: Diagnosis not present

## 2019-06-13 DIAGNOSIS — R32 Unspecified urinary incontinence: Secondary | ICD-10-CM | POA: Diagnosis not present

## 2019-06-13 DIAGNOSIS — R69 Illness, unspecified: Secondary | ICD-10-CM | POA: Diagnosis not present

## 2019-06-13 DIAGNOSIS — E039 Hypothyroidism, unspecified: Secondary | ICD-10-CM | POA: Diagnosis not present

## 2019-06-13 DIAGNOSIS — Z886 Allergy status to analgesic agent status: Secondary | ICD-10-CM | POA: Diagnosis not present

## 2019-06-13 DIAGNOSIS — Z809 Family history of malignant neoplasm, unspecified: Secondary | ICD-10-CM | POA: Diagnosis not present

## 2019-06-13 DIAGNOSIS — K219 Gastro-esophageal reflux disease without esophagitis: Secondary | ICD-10-CM | POA: Diagnosis not present

## 2019-06-13 DIAGNOSIS — Z79891 Long term (current) use of opiate analgesic: Secondary | ICD-10-CM | POA: Diagnosis not present

## 2019-06-13 DIAGNOSIS — K5732 Diverticulitis of large intestine without perforation or abscess without bleeding: Secondary | ICD-10-CM

## 2019-06-13 NOTE — Patient Instructions (Addendum)
Barium Enema scheduled  06/26/2019 @ 7:30at [facility]. No need for bowel prep.  Please check in at registration desk.  You do not need to do any prep prior to this appointment.   Please see appointment listed below.

## 2019-06-13 NOTE — Progress Notes (Signed)
Outpatient Surgical Follow Up  06/13/2019  Julie Arias is an 68 y.o. female.   Chief Complaint  Patient presents with  . Routine Post Op    1 month Post op-s/p sigmoid colectomy with diverting ileostomy-lap sigmiod colon resection 05/04/19,    HPI: 68 year old female status post laparoscopic low anterior resection and loop ileostomy for acute on chronic diverticulitis. Doing well and just reports some nausea in the morning.  She is taking p.o. and her ileostomy is working well.  Is taking some Imodium to control the output.  Past Medical History:  Diagnosis Date  . Diverticulitis    caused "perforated colon" hospitalized- no surgery  . GERD (gastroesophageal reflux disease)   . Hypothyroidism   . Thyroid disease     Past Surgical History:  Procedure Laterality Date  . ABDOMINAL HYSTERECTOMY    . COLON RESECTION N/A 05/04/2019   Procedure: LAPAROSCOPIC SIGMOID COLON RESECTION;  Surgeon: Jules Husbands, MD;  Location: [facility] ORS;  Service: General;  Laterality: N/A;  . COLONOSCOPY W/ POLYPECTOMY    . COLONOSCOPY WITH PROPOFOL N/A 07/07/2015   Procedure: COLONOSCOPY WITH PROPOFOL;  Surgeon: Garlan Fair, MD;  Location: [facility] ENDOSCOPY;  Service: Endoscopy;  Laterality: N/A;  . COLONOSCOPY WITH PROPOFOL N/A 04/06/2019   Procedure: COLONOSCOPY WITH PROPOFOL;  Surgeon: Jonathon Bellows, MD;  [facility] ENDOSCOPY;  Service: Gastroenterology;  Laterality: N/A;  . GANGLION CYST EXCISION  12/2018    Family History  Problem Relation Age of Onset  . Lung disease Father   . Lung cancer Brother     Social History:  reports that she quit smoking about 15 years ago. Her smoking use included cigarettes. She has a 25.00 pack-year smoking history. She has never used smokeless tobacco. She reports that she does not drink alcohol or use drugs.  Allergies:  Allergies  Allergen Reactions  . Aleve [Naproxen Sodium] Rash    Medications reviewed.    ROS Full ROS performed and is  otherwise negative other than what is stated in HPI   BP (!) 151/84   Pulse 70   Temp (!) 95 F (35 C) (Temporal)   Ht 5\' 4"  (1.626 m)   Wt 137 lb 6.4 oz (62.3 kg)   SpO2 98%   BMI 23.58 kg/m   Physical Exam NAD, alert Abd: soft, nt, ileostomy pink patent. No peritonitis or infection    Assessment/Plan: 68 year old female status post low anterior resection with diverting loop ileostomy.  Doing very well.  We will obtain barium enema to check patency of the anastomosis.  I will see her back in a few weeks and we schedule her for ileostomy takedown.  Caroleen Hamman, MD [facility] General Surgeon

## 2019-06-18 ENCOUNTER — Encounter: Payer: Self-pay | Admitting: *Deleted

## 2019-06-18 ENCOUNTER — Other Ambulatory Visit: Payer: Self-pay | Admitting: *Deleted

## 2019-06-18 NOTE — Patient Outreach (Signed)
[facility]) [place] Telephone Outreach PCP office completes Transition of Care follow up post-hospital discharge Post-hospital discharge day # 39  06/18/2019  Julie Arias 1950/09/17 [id number]  Successful telephone outreach toRosemarie "Jerris Arias, referred to [facility] CM after EMMI red- alert received for general discharge; patient was subsequently engaged in [place] services after recent hospitalization September 30- May 10, 2019 for diverticulitis of large intestine/ colon with hemorrhage; patient had surgical colectomy with new ostomy placement. Patient was discharged home to self-care without home health services in place. Patient has history including, but not limited to, GERD; diverticulitis; hypothyroidism; and anxiety.  HIPAA/ identity verified with patient during phone call today.  Today, patient reports "things are going great," and adds that she continues working and driving and has resumed most of her regular activities post- hospital discharge.  She denies pain and new/ recent falls and sounds to be in no distress throughout phone call today.  Patient is currently at work [employer]) during phone conversation today.  Patient further reports: -- has not yet attended upcoming PCP appointment for next week; verbalizes plans to attend "this week on Wednesday" and to obtain flu shot -- attended scheduled surgical provider office visit 06/13/2019; verbalizes plans to attend follow up barium enema "next week" as well as scheduled surgeon appointment after barium enema; reports "the enema will tell us how soon they can do the revision."  Continues driving self; denies transportation/ community resource needs -- remains independent in care, essentially now independently caring for ostomy at home -- ongoing occasional decreased appetite which continues to "comes and goes;" continues using nutritional supplements -- skin around ostomy site,  "looks really good and healthy;" states ostomy care is going well at home; changing about "every other day" each week per personal preference -- has "plenty" pf supplies" and the contact information for [employer] program; patient verbalizes appropriate care of ostomy and denies questions around same  Patient denies further issues, concerns, or problems today. Iconfirmed that patient hasmy direct phone number, the main [facility]N CM office phone number, and the [facility] CM 24-hour nurse advice phone number should issues arise prior to next scheduled [place] outreach in 2 weeks after upcoming scheduled provider appointments. Encouraged patient to contact me directly if needs, questions, issues, or concerns arise prior to next scheduled outreach; patient agreed to do so.  Plan:  Patient will take medications as prescribed and will attend all scheduled provider appointments  Patient will promptly notify care providers for any new concerns/ issues/ problems that arise  [place] outreach to continue with scheduled phone callin 2 weeks post- upcoming scheduled provider appointments  [facility] CM Care Plan Problem One     Most Recent Value  Care Plan Problem One  High risk for hospital readmission related to/ as evidenced by recent hospitalization September 30- May 10, 2019 for acute diverticulitis requiring surgical colectomy with newly placed ostomy  Role Documenting the Problem One  Care Management Coordinator  Care Plan for Problem One  Not Active  THN Long Term Goal   Over the next 31 days, patient will not experience hospital readmission, as evidenced by patient reporting and review of EMR during [facility] RN CM outreach  [facility] Long Term Goal Start Date  05/16/19  [facility] Long Term Goal Met Date  06/18/19 [facility] Met]  Interventions for Problem One Long Term Goal  Discussed current clinical condition with patient and confirmed that she has no current clinical  concerns,  encouraged  patient to promptly notify her established care providers for any new/ urgent concerns that arise,  confirmed that she has maintained communication with care providers and has contact information for all  THN CM Short Term Goal #2   Over the next 30 days, patient will schedule/ attend post-hospital discharge PCP office visit as evidenced by patient reporting and collaboration as indicated with PCP during [facility] RN CM outreach  [facility] CM Short Term Goal #2 Start Date  05/16/19  [facility] CM Short Term Goal #2 Met Date  06/18/19 [Goal partially met]  Interventions for Short Term Goal #2  Confirmed that patient is planing to go to PCP office this week for flu shot,  confirmed that patient has attended all post-surgical provider office visits and plans to attend upcoming scheduled appointments,  reviewed all recent and upcoming provider office visits with patient and confirmed that she continues driving self to all appointments    [facility] CM Care Plan Problem Two     Most Recent Value  Care Plan Problem Two  Self- health management of chronic disease state of diverticulitis, as evidenced by patient reporting  Role Documenting the Problem Two  Care Management Coordinator  Care Plan for Problem Two  Active  Interventions for Problem Two Long Term Goal   Discussed with patient recent provider office visits, along with general plan for revision of ileostomy in future,  confirmed that patient has all of her supplies for management of ileostomy at home,  using teach back method, discussed skin care/ daily management of ileostomy and confirmed that patient has no questions/ concerns and is able to accurately verbalize appropriate care of ostomy at home,  discussed general plan for upcoming barium enema and revision surgery with patient  THN Long Term Goal  Over the next 60 days, patient will verbalize plan for upcoming surgical revision of ileostomy, as evidenced by patient reporting of same   [place] Term Goal Start Date   06/18/19      [name], RN, BSN, [place] Coordinator [facility] Care Management  [phone]

## 2019-06-20 DIAGNOSIS — Z23 Encounter for immunization: Secondary | ICD-10-CM | POA: Diagnosis not present

## 2019-06-26 ENCOUNTER — Ambulatory Visit
Admission: RE | Admit: 2019-06-26 | Discharge: 2019-06-26 | Disposition: A | Discharge status: Home / Self Care | Payer: Medicare HMO | Source: Ambulatory Visit | Attending: Surgery | Admitting: Surgery

## 2019-06-26 ENCOUNTER — Other Ambulatory Visit: Payer: Self-pay

## 2019-06-26 ENCOUNTER — Other Ambulatory Visit: Payer: Self-pay | Admitting: Surgery

## 2019-06-26 DIAGNOSIS — K5732 Diverticulitis of large intestine without perforation or abscess without bleeding: Secondary | ICD-10-CM | POA: Insufficient documentation

## 2019-06-26 DIAGNOSIS — Z9049 Acquired absence of other specified parts of digestive tract: Secondary | ICD-10-CM | POA: Diagnosis not present

## 2019-06-27 ENCOUNTER — Ambulatory Visit: Payer: Self-pay | Admitting: Surgery

## 2019-07-02 ENCOUNTER — Telehealth: Payer: Self-pay

## 2019-07-02 ENCOUNTER — Encounter: Payer: Self-pay | Admitting: *Deleted

## 2019-07-02 ENCOUNTER — Other Ambulatory Visit: Payer: Self-pay | Admitting: *Deleted

## 2019-07-02 NOTE — Telephone Encounter (Signed)
Spoke with patient notified of recent normal barium study results. Patient verbalizes understanding. Patient states she will keep scheduled appt. 07/11/19 at 1:45p to discuss with Pabon the reversal of her bag.

## 2019-07-02 NOTE — Telephone Encounter (Signed)
-----   Message from Jules Husbands, MD sent at 07/01/2019  9:42 AM EST ----- Please let her know the barium study was nml,  no concernes ----- Message ----- From: Interface, Rad Results In Sent: 06/26/2019   8:41 AM EST To: Jules Husbands, MD

## 2019-07-02 NOTE — Patient Outreach (Signed)
[place] [facility] [facility]) Care Management [place] Telephone Outreach Post-hospital discharge day # 53 Unsuccessful (consecutive) outreach attempt # 1- previously engaged patient  07/02/2019  Julie Arias [birth year] [id number]  Unsuccessful telephone outreach toRosemarie "Lemar [place], referred to [facility] CM after EMMI red- alert received for general discharge; patient was subsequently engaged in [place] services after recenthospitalization September 30- May 10, 2019 for diverticulitis of large intestine/ colon with hemorrhage; patient had surgical colectomy with new ostomy placement. Patient was discharged home to self-care without home health services in place. Patient has history including, but not limited to, GERD; diverticulitis; hypothyroidism; and anxiety.  With call attempt today, received automated outgoing message stating that the person I am trying to reach is not available and has a voice mail box that is full- unable to leave patient voice message requesting call back  Plan:  Will re-attempt [place] telephone outreach within 4 business days if I do not hear back from patient first  [name], RN, BSN, [employer] Coordinator [facility] Care Management  [phone]

## 2019-07-03 DIAGNOSIS — T148XXA Other injury of unspecified body region, initial encounter: Secondary | ICD-10-CM

## 2019-07-03 HISTORY — DX: Other injury of unspecified body region, initial encounter: T14.8XXA

## 2019-07-05 ENCOUNTER — Other Ambulatory Visit: Payer: Self-pay | Admitting: *Deleted

## 2019-07-05 ENCOUNTER — Encounter: Payer: Self-pay | Admitting: *Deleted

## 2019-07-05 NOTE — Patient Outreach (Signed)
[name]) Care Management [place] Telephone Outreach Post-hospital discharge day # 56  07/05/2019  Julie Arias 09-Apr-1951 [id number]  Successful telephone outreach toRosemarie "Daarina Arias, referred to [facility] CM after EMMI red- alert received for general discharge; patient was subsequently engaged in [place] services after recenthospitalization September 30- May 10, 2019 for diverticulitis of large intestine/ colon with hemorrhage; patient had surgical colectomy with new ostomy placement. Patient was discharged home to self-care without home health services in place. Patient has history including, but not limited to, GERD; diverticulitis; hypothyroidism; and anxiety.HIPAA/ identity verified with patient during phone call today.  Today, patient again reports "things are going great, having no problems."  Reports she continues working and driving and has resumed her regular activities post- hospital discharge.  She denies ongoing pain and new/ recent falls and sounds to be in no distress throughout phone call today.  Patient is currently at work [employer]) during phone conversation today.  Patient further reports: -- attended PCP appointment 06/20/2019; obtained flu shot -- attended diagnostic testing around barium studies to determine efficacy of ostomy/ previous surgical intervention: heard back with results and was told all was normal- okay to proceed with plans to surgically reverse osotmy -- plans to attend post-surgical office visit on July 18, 2019- plans to discuss process of surgical revision of ostomy -- Continues driving self; denies transportation/ community resource needs -- remains independent in care, now independently caring for ostomy at home -- improved appetite; occasional twinges of pain; goes away without intervention -- skin "looks good but is itchy;" able to accurately verbalize self-care of ostomy -- continues having "plenty"  of supplies for ostomy care   Patient denies further issues, concerns, or problems today. Iconfirmed that patient hasmy direct phone number, the main [facility]N CM office phone number, and the [facility] CM 24-hour nurse advice phone number should issues arise prior to next scheduled [facility] Community CM outreachin 4 weeks after upcoming scheduled surgical provider appointment. Encouraged patient to contact me directly if needs, questions, issues, or concerns arise prior to next scheduled outreach; patient agreed to do so.  Plan:  Patient will take medications as prescribed and will attend all scheduled provider appointments  Patient will promptly notify care providers for any new concerns/ issues/ problems that arise  [place] outreach to continue with scheduled phone callin 4 weeks post- upcoming scheduled provider appointment  [facility] CM Care Plan Problem Two     Most Recent Value  Care Plan Problem Two  Self- health management of chronic disease state of diverticulitis, as evidenced by patient reporting  Role Documenting the Problem Two  Care Management [place] for Problem Two  Active  Interventions for Problem Two Long Term Goal   Discussed with patient her recent diagnostic tests to determine post-surgical progress,  confirmed with patient that she has heard results from testing and is awaiting post- procedure follow up visit with surgeon,  confirms that patient has an appointment scheduled to discuss ostomy reversal process  THN Long Term Goal  Over the next 60 days, patient will verbalize plan for upcoming surgical revision of ileostomy, as evidenced by patient reporting of same   [place] Term Goal Start Date  06/18/19     [name], RN, BSN, [place] Coordinator [facility] Care Management  [phone]

## 2019-07-07 DIAGNOSIS — Z888 Allergy status to other drugs, medicaments and biological substances status: Secondary | ICD-10-CM | POA: Diagnosis not present

## 2019-07-07 DIAGNOSIS — L03221 Cellulitis of neck: Secondary | ICD-10-CM | POA: Diagnosis not present

## 2019-07-07 DIAGNOSIS — M542 Cervicalgia: Secondary | ICD-10-CM | POA: Diagnosis not present

## 2019-07-07 DIAGNOSIS — R221 Localized swelling, mass and lump, neck: Secondary | ICD-10-CM | POA: Diagnosis not present

## 2019-07-07 DIAGNOSIS — S1093XD Contusion of unspecified part of neck, subsequent encounter: Secondary | ICD-10-CM | POA: Diagnosis not present

## 2019-07-07 DIAGNOSIS — E079 Disorder of thyroid, unspecified: Secondary | ICD-10-CM | POA: Diagnosis not present

## 2019-07-11 ENCOUNTER — Ambulatory Visit: Payer: Self-pay | Admitting: Surgery

## 2019-07-11 DIAGNOSIS — S1093XD Contusion of unspecified part of neck, subsequent encounter: Secondary | ICD-10-CM | POA: Diagnosis not present

## 2019-07-18 ENCOUNTER — Encounter: Payer: Self-pay | Admitting: Surgery

## 2019-07-18 ENCOUNTER — Other Ambulatory Visit: Payer: Self-pay

## 2019-07-18 ENCOUNTER — Ambulatory Visit: Payer: Self-pay | Admitting: Surgery

## 2019-07-18 VITALS — BP 111/74 | HR 115 | Temp 97.7°F | Resp 14 | Ht 63.0 in | Wt 130.2 lb

## 2019-07-18 DIAGNOSIS — R197 Diarrhea, unspecified: Secondary | ICD-10-CM

## 2019-07-18 DIAGNOSIS — S1093XD Contusion of unspecified part of neck, subsequent encounter: Secondary | ICD-10-CM | POA: Diagnosis not present

## 2019-07-18 DIAGNOSIS — R110 Nausea: Secondary | ICD-10-CM | POA: Diagnosis not present

## 2019-07-18 NOTE — Patient Instructions (Addendum)
Patient may take up to 8 mg of Imodium a day. Patient will follow up with our office in three weeks with Dr.Pabon on 08/08/19 at 10:30a.  Barium Swallow A barium swallow is an X-ray test that is used to check:  Your throat.  The part of your body that moves food from your mouth to your stomach (esophagus).  Your stomach. For this test, you will drink a Merkle liquid called barium. The liquid shows up well on X-rays and helps your doctor see problems. Tell a doctor about:  Any allergies you have. This should include any allergies to substances that are used in X-ray tests (contrast materials).  All medicines you are taking, including vitamins, herbs, eye drops, creams, and over-the-counter medicines.  Any surgeries you have had.  Any medical conditions you have.  Whether you are pregnant or may be pregnant. What are the risks? Generally, this is a safe test. However, problems may occur. These may include:  Trouble pooping.  A small amount of high-energy rays (radiation) going in your body.  Allergic reaction to the liquid that you drink for the test. What happens before the procedure?  Follow your doctor's instructions about limiting what you eat or drink.  Ask your doctor about changing or stopping your normal medicines. This is important if you take diabetes medicines or blood thinners. What happens during the procedure?  You will be positioned on an X-ray table.  You will drink the liquid barium. This liquid looks like a milkshake.  The X-ray table may be moved so you are sitting up more. You may also need to change your position on the table.  X-ray pictures will be shown on a screen (monitor). This lets the doctor watch the barium as it passes through your body. The procedure may vary among doctors and hospitals. What happens after the procedure?  Your poop (feces) may be [place] or gray for 2-3 days. You may be given a medicine that helps you poop (laxative). This  helps the barium leave your body.  Ask your doctor when and how you will get your test results.  Talk with your doctor about what your results mean. Follow these instructions at home:   Go back to your normal activities and your normal diet as told by your doctor.  To help you not have trouble pooping after this test, your doctor may tell you to: ? Drink enough fluid to keep your pee (urine) pale yellow. ? Eat foods that have a lot of fiber in them. These include fruits, vegetables, whole grains, and beans. ? Limit foods that are high in fat and sugars. These include fried or sweet foods. ? Take an over-the-counter or prescription medicine. Contact a doctor if you:  Have trouble pooping.  Cannot poop or pass gas.  Have pain or swelling in your belly.  Have a fever. Summary  A barium swallow is an X-ray test that is used to check your throat, your stomach, and the part of your body that moves food from your mouth to your stomach.  You will drink a Kunst liquid called barium that shows up well on X-rays.  Talk with your doctor about what your results mean. This information is not intended to replace advice given to you by your health care provider. Make sure you discuss any questions you have with your health care provider. Document Released: 08/21/2010 Document Revised: 07/01/2017 Document Reviewed: 05/26/2017 Elsevier Patient Education  2020 [employer].

## 2019-07-18 NOTE — Progress Notes (Signed)
Outpatient Surgical Follow Up  07/18/2019  Julie Arias is an 68 y.o. female.   Chief Complaint  Patient presents with  . Follow-up    discuss barium swallow    HPI: Julie Arias is a 68 year old female well-known to me status post low anterior resection with loop ileostomy.  Now she has a new right neck mass questionable hematoma that is resolving.  She is seen by ENT in [place]. She reports that a few weeks ago developed sudden right neck mass went to the emergency room in [place] where CT scan was performed showing evidence of a hematoma.  She reports that the mass has decreased in size and she is being followed by ENT.  Questionable plans for MRI this week.  Regarding her ileostomy she has been doing well no fevers no chills she is taking p.o. no vomiting.  Ostomy is working well and she is doing Imodium to control the output.  I did perform a barium enema that I have personally reviewed there is no evidence of an anastomotic leak and there is widely patent anastomosis.   Past Medical History:  Diagnosis Date  . Diverticulitis    caused "perforated colon" hospitalized- no surgery  . GERD (gastroesophageal reflux disease)   . Hypothyroidism   . Thyroid disease     Past Surgical History:  Procedure Laterality Date  . ABDOMINAL HYSTERECTOMY    . COLON RESECTION N/A 05/04/2019   Procedure: LAPAROSCOPIC SIGMOID COLON RESECTION;  Surgeon: Jules Husbands, MD;  Location: [facility] ORS;  Service: General;  Laterality: N/A;  . COLONOSCOPY W/ POLYPECTOMY    . COLONOSCOPY WITH PROPOFOL N/A 07/07/2015   Procedure: COLONOSCOPY WITH PROPOFOL;  Surgeon: Garlan Fair, MD;  Location: [facility] END[facility]COPY;  Service: Endoscopy;  Laterality: N/A;  . COLONOSCOPY WITH PROPOFOL N/A 04/06/2019   Procedure: COLONOSCOPY WITH PROPOFOL;  Surgeon: Jonathon Bellows, MD;  Location: [facility] ENDOSCOPY;  Service: Gastroenterology;  Laterality: N/A;  . GANGLION CYST EXCISION  12/2018    Family History  Problem  Relation Age of Onset  . Lung disease Father   . Lung cancer Brother     Social History:  reports that she quit smoking about 15 years ago. Her smoking use included cigarettes. She has a 25.00 pack-year smoking history. She has never used smokeless tobacco. She reports that she does not drink alcohol or use drugs.  Allergies:  Allergies  Allergen Reactions  . Aleve [Naproxen Sodium] Rash    Medications reviewed.    ROS Full ROS performed and is otherwise negative other than what is stated in HPI   BP 111/74   Pulse (!) 115   Temp 97.7 F (36.5 C) (Temporal)   Resp 14   Ht 5\' 3"  (1.6 m)   Wt 130 lb 3.2 oz (59.1 kg)   SpO2 96%   BMI 23.06 kg/m   Physical Exam Vitals and nursing note reviewed. Exam conducted with a chaperone present.  Constitutional:      Appearance: Normal appearance. She is normal weight.  Eyes:     General: No scleral icterus.       Right eye: No discharge.        Left eye: No discharge.  Neck:     Comments: Evidence of right neck mass measuring 3 x 2 cm that is mobile but hard.  It is located on top of the midportion of the right SCM  Cardiovascular:     Rate and Rhythm: Normal rate and regular rhythm.  Pulmonary:  Effort: Pulmonary effort is normal. No respiratory distress.     Breath sounds: Normal breath sounds. No stridor. No wheezing.  Abdominal:     General: Abdomen is flat. There is no distension.     Palpations: Abdomen is soft.     Tenderness: There is no abdominal tenderness. There is no guarding or rebound.     Comments: ileostomy pink and  patent.  Musculoskeletal:     Cervical back: Normal range of motion and neck supple. No rigidity or tenderness.  Skin:    General: Skin is warm and dry.     Capillary Refill: Capillary refill takes less than 2 seconds.  Neurological:     General: No focal deficit present.     Mental Status: She is alert and oriented to person, place, and time.  Psychiatric:        Mood and Affect: Mood  normal.        Behavior: Behavior normal.        Thought Content: Thought content normal.        Judgment: Judgment normal.         Assessment/Plan: Julie Arias is a 68 year old female well-known to me status post low anterior resection with loop ileostomy.  Now she has a new right neck mass questionable hematoma that is resolving.  She is seen by ENT in [place].  I will like to make sure that she will not require any other major surgical intervention within the neck and this neck mass resolved prior to a general anesthetic and a takedown.  I will see her back in about a month and then we will discuss further plans for ileostomy takedown. Please note that this encounter was focusing on the decision-making process of ileostomy takedown.  From a surgical perspective she is doing very well from her previous surgery and this is a separate issue. Greater than 50% of the 25 minutes  visit was spent in counseling/coordination of care   Caroleen Hamman, MD [place] Surgeon

## 2019-07-19 DIAGNOSIS — Z20828 Contact with and (suspected) exposure to other viral communicable diseases: Secondary | ICD-10-CM | POA: Diagnosis not present

## 2019-07-25 ENCOUNTER — Emergency Department (HOSPITAL_COMMUNITY): Payer: Medicare HMO

## 2019-07-25 ENCOUNTER — Encounter (HOSPITAL_COMMUNITY): Payer: Self-pay

## 2019-07-25 ENCOUNTER — Inpatient Hospital Stay (HOSPITAL_COMMUNITY)
Admission: EM | Admit: 2019-07-25 | Discharge: 2019-07-29 | DRG: 177 | Disposition: A | Discharge status: Home / Self Care | Payer: Medicare HMO | Attending: Internal Medicine | Admitting: Internal Medicine

## 2019-07-25 ENCOUNTER — Other Ambulatory Visit: Payer: Self-pay

## 2019-07-25 DIAGNOSIS — E875 Hyperkalemia: Secondary | ICD-10-CM | POA: Diagnosis not present

## 2019-07-25 DIAGNOSIS — K219 Gastro-esophageal reflux disease without esophagitis: Secondary | ICD-10-CM | POA: Diagnosis present

## 2019-07-25 DIAGNOSIS — K5792 Diverticulitis of intestine, part unspecified, without perforation or abscess without bleeding: Secondary | ICD-10-CM | POA: Diagnosis present

## 2019-07-25 DIAGNOSIS — Z9049 Acquired absence of other specified parts of digestive tract: Secondary | ICD-10-CM | POA: Diagnosis not present

## 2019-07-25 DIAGNOSIS — U071 COVID-19: Secondary | ICD-10-CM | POA: Diagnosis not present

## 2019-07-25 DIAGNOSIS — Z79890 Hormone replacement therapy (postmenopausal): Secondary | ICD-10-CM

## 2019-07-25 DIAGNOSIS — Z9981 Dependence on supplemental oxygen: Secondary | ICD-10-CM | POA: Diagnosis not present

## 2019-07-25 DIAGNOSIS — E871 Hypo-osmolality and hyponatremia: Secondary | ICD-10-CM | POA: Diagnosis not present

## 2019-07-25 DIAGNOSIS — E861 Hypovolemia: Secondary | ICD-10-CM | POA: Diagnosis present

## 2019-07-25 DIAGNOSIS — R112 Nausea with vomiting, unspecified: Secondary | ICD-10-CM

## 2019-07-25 DIAGNOSIS — F419 Anxiety disorder, unspecified: Secondary | ICD-10-CM | POA: Diagnosis present

## 2019-07-25 DIAGNOSIS — E8729 Other acidosis: Secondary | ICD-10-CM | POA: Diagnosis present

## 2019-07-25 DIAGNOSIS — J9601 Acute respiratory failure with hypoxia: Secondary | ICD-10-CM | POA: Diagnosis present

## 2019-07-25 DIAGNOSIS — A0839 Other viral enteritis: Secondary | ICD-10-CM | POA: Diagnosis present

## 2019-07-25 DIAGNOSIS — E860 Dehydration: Secondary | ICD-10-CM | POA: Diagnosis not present

## 2019-07-25 DIAGNOSIS — E039 Hypothyroidism, unspecified: Secondary | ICD-10-CM | POA: Diagnosis not present

## 2019-07-25 DIAGNOSIS — Z888 Allergy status to other drugs, medicaments and biological substances status: Secondary | ICD-10-CM | POA: Diagnosis not present

## 2019-07-25 DIAGNOSIS — Z933 Colostomy status: Secondary | ICD-10-CM | POA: Diagnosis not present

## 2019-07-25 DIAGNOSIS — R8290 Unspecified abnormal findings in urine: Secondary | ICD-10-CM | POA: Diagnosis present

## 2019-07-25 DIAGNOSIS — Z801 Family history of malignant neoplasm of trachea, bronchus and lung: Secondary | ICD-10-CM

## 2019-07-25 DIAGNOSIS — N179 Acute kidney failure, unspecified: Secondary | ICD-10-CM | POA: Diagnosis present

## 2019-07-25 DIAGNOSIS — D696 Thrombocytopenia, unspecified: Secondary | ICD-10-CM | POA: Diagnosis present

## 2019-07-25 DIAGNOSIS — J9811 Atelectasis: Secondary | ICD-10-CM | POA: Diagnosis not present

## 2019-07-25 DIAGNOSIS — Z87891 Personal history of nicotine dependence: Secondary | ICD-10-CM | POA: Diagnosis not present

## 2019-07-25 DIAGNOSIS — Z79899 Other long term (current) drug therapy: Secondary | ICD-10-CM | POA: Diagnosis not present

## 2019-07-25 DIAGNOSIS — E872 Acidosis: Secondary | ICD-10-CM | POA: Diagnosis present

## 2019-07-25 DIAGNOSIS — D473 Essential (hemorrhagic) thrombocythemia: Secondary | ICD-10-CM | POA: Diagnosis not present

## 2019-07-25 DIAGNOSIS — D75839 Thrombocytosis, unspecified: Secondary | ICD-10-CM

## 2019-07-25 LAB — CBC
HCT: 50.2 % — ABNORMAL HIGH (ref 36.0–46.0)
Hemoglobin: 17.3 g/dL — ABNORMAL HIGH (ref 12.0–15.0)
MCH: 29.3 pg (ref 26.0–34.0)
MCHC: 34.5 g/dL (ref 30.0–36.0)
MCV: 84.9 fL (ref 80.0–100.0)
Platelets: 438 10*3/uL — ABNORMAL HIGH (ref 150–400)
RBC: 5.91 MIL/uL — ABNORMAL HIGH (ref 3.87–5.11)
RDW: 12.8 % (ref 11.5–15.5)
WBC: 11.2 10*3/uL — ABNORMAL HIGH (ref 4.0–10.5)
nRBC: 0 % (ref 0.0–0.2)

## 2019-07-25 LAB — COMPREHENSIVE METABOLIC PANEL
ALT: 42 U/L (ref 0–44)
AST: 43 U/L — ABNORMAL HIGH (ref 15–41)
Albumin: 3.9 g/dL (ref 3.5–5.0)
Alkaline Phosphatase: 201 U/L — ABNORMAL HIGH (ref 38–126)
Anion gap: 16 — ABNORMAL HIGH (ref 5–15)
BUN: 78 mg/dL — ABNORMAL HIGH (ref 8–23)
CO2: 18 mmol/L — ABNORMAL LOW (ref 22–32)
Calcium: 9.6 mg/dL (ref 8.9–10.3)
Chloride: 92 mmol/L — ABNORMAL LOW (ref 98–111)
Creatinine, Ser: 1.75 mg/dL — ABNORMAL HIGH (ref 0.44–1.00)
GFR calc Af Amer: 35 mL/min — ABNORMAL LOW (ref >60)
GFR calc non Af Amer: 30 mL/min — ABNORMAL LOW (ref >60)
Glucose, Bld: 131 mg/dL — ABNORMAL HIGH (ref 70–99)
Potassium: 5.2 mmol/L — ABNORMAL HIGH (ref 3.5–5.1)
Sodium: 126 mmol/L — ABNORMAL LOW (ref 135–145)
Total Bilirubin: 1 mg/dL (ref 0.3–1.2)
Total Protein: 8.3 g/dL — ABNORMAL HIGH (ref 6.5–8.1)

## 2019-07-25 LAB — LIPASE, BLOOD: Lipase: 121 U/L — ABNORMAL HIGH (ref 11–51)

## 2019-07-25 MED ORDER — HYDROMORPHONE HCL 1 MG/ML IJ SOLN
0.5000 mg | Freq: Once | INTRAMUSCULAR | Status: AC
  Administered 2019-07-25: 0.5 mg via INTRAVENOUS
  Filled 2019-07-25: qty 1

## 2019-07-25 MED ORDER — ENOXAPARIN SODIUM 30 MG/0.3ML ~~LOC~~ SOLN
30.0000 mg | SUBCUTANEOUS | Status: DC
  Administered 2019-07-26 – 2019-07-27 (×2): 30 mg via SUBCUTANEOUS
  Filled 2019-07-25 (×2): qty 0.3

## 2019-07-25 MED ORDER — SODIUM CHLORIDE 0.9 % IV SOLN
1000.0000 mL | INTRAVENOUS | Status: DC
  Administered 2019-07-25 – 2019-07-26 (×2): 1000 mL via INTRAVENOUS

## 2019-07-25 MED ORDER — DEXAMETHASONE SODIUM PHOSPHATE 10 MG/ML IJ SOLN
6.0000 mg | INTRAMUSCULAR | Status: DC
  Administered 2019-07-26 – 2019-07-28 (×4): 6 mg via INTRAVENOUS
  Filled 2019-07-25 (×3): qty 1

## 2019-07-25 MED ORDER — SODIUM CHLORIDE 0.9 % IV BOLUS (SEPSIS)
1000.0000 mL | Freq: Once | INTRAVENOUS | Status: AC
  Administered 2019-07-25: 1000 mL via INTRAVENOUS

## 2019-07-25 MED ORDER — ONDANSETRON HCL 4 MG/2ML IJ SOLN
4.0000 mg | Freq: Once | INTRAMUSCULAR | Status: AC
  Administered 2019-07-25: 4 mg via INTRAVENOUS
  Filled 2019-07-25: qty 2

## 2019-07-25 NOTE — ED Triage Notes (Signed)
Pt reports 3 weeks of generalized fatigue, nausea and no appetite, pt tested positive for COVID 1 week ago. Pt has ostomy bag in place. Pt a.o, denies cp/sob.

## 2019-07-25 NOTE — ED Provider Notes (Addendum)
[place] EMERGENCY DEPARTMENT Provider Note   CSN: [id number] Arrival date & time: 07/25/19  1615     History Chief Complaint  Patient presents with  . COVID +  . Nausea    Julie Arias is a 68 y.o. female.  HPI Patient reports that she had a positive Covid test at [facility] a week ago.  Patient reports that she had a partial colectomy and colostomy done in October due to diverticulitis.  Op note from 10\2\2020 by Dr. Deboraha Sprang indicates the patient had lysis of adhesions with left colectomy and diverting loop ileostomy.  Patient reports that she started getting vomiting, loss of appetite and severe fatigue about 3 weeks ago.  She had a Covid test was +1-week ago.  She reports that she has been trying to take Zofran as prescribed and trying to make herself eat and drink but she has become increasingly nauseated and cannot keep anything down.  She reports she has had multiple episodes of vomiting and feels extremely weak.  She reports if she stands up she feels like she might pass out.  She reports she feels achy but has not had a fever.  She reports she feels mildly short of breath but mostly after vomiting.  No productive cough.  No chest pain.  She denies her abdomen is painful but just feels intensely and persistently nauseated.    Past Medical History:  Diagnosis Date  . Diverticulitis    caused "perforated colon" hospitalized- no surgery  . GERD (gastroesophageal reflux disease)   . Hypothyroidism   . Thyroid disease     Patient Active Problem List   Diagnosis Date Noted  . Diverticulitis large intestine 05/02/2019  . Acute diverticulitis 02/04/2019  . Hypothyroidism 02/04/2019  . GERD without esophagitis 02/04/2019  . Anxiety 02/04/2019    Past Surgical History:  Procedure Laterality Date  . ABDOMINAL HYSTERECTOMY    . COLON RESECTION N/A 05/04/2019   Procedure: LAPAROSCOPIC SIGMOID COLON RESECTION;  Surgeon: Jules Husbands, MD;  Location: [facility] ORS;   Service: General;  Laterality: N/A;  . COLONOSCOPY W/ POLYPECTOMY    . COLONOSCOPY WITH PROPOFOL N/A 07/07/2015   Procedure: COLONOSCOPY WITH PROPOFOL;  Surgeon: Garlan Fair, MD;  Location: [facility];  Service: Endoscopy;  Laterality: N/A;  . COLONOSCOPY WITH PROPOFOL N/A 04/06/2019   Procedure: COLONOSCOPY WITH PROPOFOL;  Surgeon: Jonathon Bellows, MD;  Location: [facility] ENDOSCOPY;  Service: Gastroenterology;  Laterality: N/A;  . GANGLION CYST EXCISION  12/2018     OB History    Gravida  2   Para  2   Term      Preterm      AB      Living  2     SAB      TAB      Ectopic      Multiple      Live Births           Obstetric Comments  Menstrual age: 31  Age 1st Pregnancy: 9         Family History  Problem Relation Age of Onset  . Lung disease Father   . Lung cancer Brother     Social History   Tobacco Use  . Smoking status: Former Smoker    Packs/day: 1.00    Years: 25.00    Pack years: 25.00    Types: Cigarettes    Quit date: 01/03/2004    Years since quitting: 15.5  . Smokeless tobacco:  Never Used  Substance Use Topics  . Alcohol use: No  . Drug use: No    Home Medications Prior to Admission medications   Medication Sig Start Date End Date Taking? Authorizing Provider  cyclobenzaprine (FLEXERIL) 5 MG tablet Take 1 tablet (5 mg total) by mouth 3 (three) times daily. 05/10/19   Tylene Fantasia, PA-C  EUTHYROX 175 MCG tablet Take 175 mcg by mouth daily before breakfast.  11/29/18   [provider]  ibuprofen (ADVIL) 200 MG tablet Take 400 mg by mouth 2 (two) times daily as needed for headache.    [provider]  loperamide (IMODIUM) 2 MG capsule Take 1 capsule (2 mg total) by mouth 2 (two) times daily. Patient taking differently: Take 2 mg by mouth 4 (four) times daily as needed.  05/10/19   Tylene Fantasia, PA-C  LORazepam (ATIVAN) 0.5 MG tablet Take 0.5 mg by mouth at bedtime as needed for anxiety or sleep.     [provider]  omeprazole (PRILOSEC) 40 MG capsule Take 40 mg by mouth daily.    [provider]  ondansetron (ZOFRAN) 4 MG tablet Take 1 tablet (4 mg total) by mouth every 8 (eight) hours as needed for nausea or vomiting. 05/16/19   Tylene Fantasia, PA-C  traMADol (ULTRAM) 50 MG tablet Take 50 mg by mouth every 6 (six) hours as needed.    [provider]  valACYclovir (VALTREX) 1000 MG tablet Take 1,000 mg by mouth 2 (two) times daily as needed (cold sores).  11/27/18   [provider]    Allergies    Aleve [naproxen sodium]  Review of Systems   Review of Systems 10 Systems reviewed and are negative for acute change except as noted in the HPI.  Physical Exam Updated Vital Signs BP 117/84 (BP Location: Left Arm)   Pulse (!) 105   Temp 97.6 F (36.4 C) (Oral)   Resp 18   SpO2 95%   Physical Exam Constitutional:      Comments: Patient is alert with clear mental status but appears ill, weak and fatigued.  No respiratory distress.  HENT:     Head: Normocephalic and atraumatic.  Eyes:     Extraocular Movements: Extraocular movements intact.  Cardiovascular:     Comments: Borderline tachycardia.  No gross rub murmur gallop. Pulmonary:     Effort: Pulmonary effort is normal.     Breath sounds: Normal breath sounds.  Abdominal:     Comments: Patient has colostomy bag on right lower abdomen.  Abdomen is soft without guarding.  Patient denies any significant or localizing tenderness.   Musculoskeletal:        General: No swelling or tenderness. Normal range of motion.     Right lower leg: No edema.     Left lower leg: No edema.  Skin:    General: Skin is warm and dry.     Coloration: Skin is pale.  Neurological:     General: No focal deficit present.     Mental Status: She is oriented to person, place, and time.     Coordination: Coordination normal.     ED Results / Procedures / Treatments   Labs (all labs ordered are listed, but only abnormal  results are displayed) Labs Reviewed  LIPASE, BLOOD - Abnormal; Notable for the following components:      Result Value   Lipase 121 (*)    All other components within normal limits  COMPREHENSIVE METABOLIC PANEL -  Abnormal; Notable for the following components:   Sodium 126 (*)    Potassium 5.2 (*)    Chloride 92 (*)    CO2 18 (*)    Glucose, Bld 131 (*)    BUN 78 (*)    Creatinine, Ser 1.75 (*)    Total Protein 8.3 (*)    AST 43 (*)    Alkaline Phosphatase 201 (*)    GFR calc non Af Amer 30 (*)    GFR calc Af Amer 35 (*)    Anion gap 16 (*)    All other components within normal limits  CBC - Abnormal; Notable for the following components:   WBC 11.2 (*)    RBC 5.91 (*)    Hemoglobin 17.3 (*)    HCT 50.2 (*)    Platelets 438 (*)    All other components within normal limits  CULTURE, BLOOD (ROUTINE X 2)  CULTURE, BLOOD (ROUTINE X 2)  SARS CORONAVIRUS 2 (TAT 6-24 HRS)  LACTIC ACID, PLASMA  LACTIC ACID, PLASMA  URINALYSIS, ROUTINE W REFLEX MICROSCOPIC  D-DIMER, QUANTITATIVE (NOT AT [facility])  PROCALCITONIN  LACTATE DEHYDROGENASE  FERRITIN  TRIGLYCERIDES  FIBRINOGEN  C-REACTIVE PROTEIN  TROPONIN I (HIGH SENSITIVITY)    EKG None  Radiology No results found.  Procedures Procedures (including critical care time)  Medications Ordered in ED Medications  sodium chloride 0.9 % bolus 1,000 mL (has no administration in time range)    Followed by  0.9 %  sodium chloride infusion (has no administration in time range)  HYDROmorphone (DILAUDID) injection 0.5 mg (has no administration in time range)  ondansetron (ZOFRAN) injection 4 mg (has no administration in time range)    ED Course  I have reviewed the triage vital signs and the nursing notes.  Pertinent labs & imaging results that were available during my care of the patient were reviewed by me and considered in my medical decision making (see chart for details).  Clinical Course as of Jul 24 2305  Wed Jul 25, 2019   2305 Consult: Dr. Flossie Buffy for admission   [MP]    Clinical Course User Index [MP] Charlesetta Shanks, MD   MDM Rules/Calculators/A&P                     Patient presents as outlined above.  She has had severe loss of appetite, difficulty eating and now also recurrent vomiting.  Patient has acute renal insufficiency with dehydration and hyponatremia.  Patient did test positive for [facility] 1 week ago.  She does not have shortness of breath or cough.  Patient will need admission for control of nausea vomiting and hydration. Final Clinical Impression(s) / ED Diagnoses Final diagnoses:  Nausea and vomiting, intractability of vomiting not specified, unspecified vomiting type  COVID-19  Status post partial colectomy  Hyponatremia  AKI (acute kidney injury) [facility])    Rx / DC Orders ED Discharge Orders    None       Charlesetta Shanks, MD 07/25/19 2303    Charlesetta Shanks, MD 07/25/19 2306

## 2019-07-26 DIAGNOSIS — E860 Dehydration: Secondary | ICD-10-CM

## 2019-07-26 DIAGNOSIS — J9601 Acute respiratory failure with hypoxia: Secondary | ICD-10-CM | POA: Diagnosis present

## 2019-07-26 DIAGNOSIS — E8729 Other acidosis: Secondary | ICD-10-CM | POA: Diagnosis present

## 2019-07-26 DIAGNOSIS — N179 Acute kidney failure, unspecified: Secondary | ICD-10-CM | POA: Diagnosis present

## 2019-07-26 DIAGNOSIS — E872 Acidosis: Secondary | ICD-10-CM

## 2019-07-26 DIAGNOSIS — D75839 Thrombocytosis, unspecified: Secondary | ICD-10-CM

## 2019-07-26 DIAGNOSIS — D473 Essential (hemorrhagic) thrombocythemia: Secondary | ICD-10-CM

## 2019-07-26 DIAGNOSIS — U071 COVID-19: Secondary | ICD-10-CM | POA: Diagnosis present

## 2019-07-26 DIAGNOSIS — E871 Hypo-osmolality and hyponatremia: Secondary | ICD-10-CM | POA: Diagnosis present

## 2019-07-26 DIAGNOSIS — E875 Hyperkalemia: Secondary | ICD-10-CM | POA: Diagnosis present

## 2019-07-26 DIAGNOSIS — E039 Hypothyroidism, unspecified: Secondary | ICD-10-CM

## 2019-07-26 DIAGNOSIS — A0839 Other viral enteritis: Secondary | ICD-10-CM | POA: Diagnosis present

## 2019-07-26 DIAGNOSIS — R112 Nausea with vomiting, unspecified: Secondary | ICD-10-CM

## 2019-07-26 DIAGNOSIS — Z9049 Acquired absence of other specified parts of digestive tract: Secondary | ICD-10-CM

## 2019-07-26 LAB — BASIC METABOLIC PANEL
Anion gap: 8 (ref 5–15)
Anion gap: 11 (ref 5–15)
BUN: 51 mg/dL — ABNORMAL HIGH (ref 8–23)
BUN: 31 mg/dL — ABNORMAL HIGH (ref 8–23)
CO2: 22 mmol/L (ref 22–32)
CO2: 19 mmol/L — ABNORMAL LOW (ref 22–32)
Calcium: 8.9 mg/dL (ref 8.9–10.3)
Calcium: 8.6 mg/dL — ABNORMAL LOW (ref 8.9–10.3)
Chloride: 103 mmol/L (ref 98–111)
Chloride: 103 mmol/L (ref 98–111)
Creatinine, Ser: 1.13 mg/dL — ABNORMAL HIGH (ref 0.44–1.00)
Creatinine, Ser: 0.88 mg/dL (ref 0.44–1.00)
GFR calc Af Amer: 59 mL/min — ABNORMAL LOW (ref >60)
GFR calc Af Amer: >60 mL/min (ref >60)
GFR calc non Af Amer: 51 mL/min — ABNORMAL LOW (ref >60)
GFR calc non Af Amer: >60 mL/min (ref >60)
Glucose, Bld: 131 mg/dL — ABNORMAL HIGH (ref 70–99)
Glucose, Bld: 83 mg/dL (ref 70–99)
Potassium: 5.7 mmol/L — ABNORMAL HIGH (ref 3.5–5.1)
Potassium: 4.3 mmol/L (ref 3.5–5.1)
Sodium: 133 mmol/L — ABNORMAL LOW (ref 135–145)
Sodium: 133 mmol/L — ABNORMAL LOW (ref 135–145)

## 2019-07-26 LAB — LACTIC ACID, PLASMA
Lactic Acid, Venous: 3 mmol/L (ref 0.5–1.9)
Lactic Acid, Venous: 1.8 mmol/L (ref 0.5–1.9)

## 2019-07-26 LAB — CBC
HCT: 42.7 % (ref 36.0–46.0)
Hemoglobin: 14.4 g/dL (ref 12.0–15.0)
MCH: 29.1 pg (ref 26.0–34.0)
MCHC: 33.7 g/dL (ref 30.0–36.0)
MCV: 86.4 fL (ref 80.0–100.0)
Platelets: 324 10*3/uL (ref 150–400)
RBC: 4.94 MIL/uL (ref 3.87–5.11)
RDW: 12.9 % (ref 11.5–15.5)
WBC: 5.7 10*3/uL (ref 4.0–10.5)
nRBC: 0 % (ref 0.0–0.2)

## 2019-07-26 LAB — URINALYSIS, ROUTINE W REFLEX MICROSCOPIC
Bilirubin Urine: NEGATIVE
Glucose, UA: NEGATIVE mg/dL
Ketones, ur: 5 mg/dL — AB
Nitrite: NEGATIVE
Protein, ur: NEGATIVE mg/dL
Specific Gravity, Urine: 1.024 (ref 1.005–1.030)
WBC, UA: >50 WBC/hpf — ABNORMAL HIGH (ref 0–5)
pH: 5 (ref 5.0–8.0)

## 2019-07-26 LAB — HEPATIC FUNCTION PANEL
ALT: 32 U/L (ref 0–44)
AST: 28 U/L (ref 15–41)
Albumin: 3.1 g/dL — ABNORMAL LOW (ref 3.5–5.0)
Alkaline Phosphatase: 152 U/L — ABNORMAL HIGH (ref 38–126)
Bilirubin, Direct: <0.1 mg/dL (ref 0.0–0.2)
Total Bilirubin: 0.2 mg/dL — ABNORMAL LOW (ref 0.3–1.2)
Total Protein: 7.1 g/dL (ref 6.5–8.1)

## 2019-07-26 LAB — TROPONIN I (HIGH SENSITIVITY): Troponin I (High Sensitivity): 5 ng/L (ref <18)

## 2019-07-26 LAB — SARS CORONAVIRUS 2 (TAT 6-24 HRS): SARS Coronavirus 2: POSITIVE — AB

## 2019-07-26 MED ORDER — SODIUM CHLORIDE 0.9 % IV SOLN
1000.0000 mL | INTRAVENOUS | Status: DC
  Administered 2019-07-26: 1000 mL via INTRAVENOUS

## 2019-07-26 MED ORDER — LORAZEPAM 0.5 MG PO TABS
0.5000 mg | ORAL_TABLET | Freq: Every evening | ORAL | Status: DC | PRN
  Administered 2019-07-26 – 2019-07-28 (×3): 0.5 mg via ORAL
  Filled 2019-07-26 (×3): qty 1

## 2019-07-26 MED ORDER — PANTOPRAZOLE SODIUM 40 MG PO TBEC
40.0000 mg | DELAYED_RELEASE_TABLET | Freq: Every day | ORAL | Status: DC
  Administered 2019-07-26 – 2019-07-29 (×4): 40 mg via ORAL
  Filled 2019-07-26 (×4): qty 1

## 2019-07-26 MED ORDER — LEVOTHYROXINE SODIUM 75 MCG PO TABS
175.0000 ug | ORAL_TABLET | Freq: Every day | ORAL | Status: DC
  Administered 2019-07-26 – 2019-07-29 (×4): 175 ug via ORAL
  Filled 2019-07-26 (×4): qty 1

## 2019-07-26 MED ORDER — TRAMADOL HCL 50 MG PO TABS
50.0000 mg | ORAL_TABLET | Freq: Four times a day (QID) | ORAL | Status: DC | PRN
  Administered 2019-07-26 – 2019-07-28 (×5): 50 mg via ORAL
  Filled 2019-07-26 (×5): qty 1

## 2019-07-26 MED ORDER — ONDANSETRON HCL 4 MG/2ML IJ SOLN
4.0000 mg | Freq: Four times a day (QID) | INTRAMUSCULAR | Status: DC | PRN
  Administered 2019-07-26 (×3): 4 mg via INTRAVENOUS
  Filled 2019-07-26 (×3): qty 2

## 2019-07-26 MED ORDER — CYCLOBENZAPRINE HCL 5 MG PO TABS
5.0000 mg | ORAL_TABLET | Freq: Three times a day (TID) | ORAL | Status: DC
  Administered 2019-07-26 – 2019-07-29 (×10): 5 mg via ORAL
  Filled 2019-07-26 (×10): qty 1

## 2019-07-26 MED ORDER — SODIUM CHLORIDE 0.9 % IV SOLN
100.0000 mg | Freq: Every day | INTRAVENOUS | Status: AC
  Administered 2019-07-26 – 2019-07-29 (×4): 100 mg via INTRAVENOUS
  Filled 2019-07-26 (×2): qty 20
  Filled 2019-07-26: qty 100
  Filled 2019-07-26: qty 20

## 2019-07-26 MED ORDER — SODIUM CHLORIDE 0.9 % IV SOLN
200.0000 mg | Freq: Once | INTRAVENOUS | Status: AC
  Administered 2019-07-26: 200 mg via INTRAVENOUS
  Filled 2019-07-26: qty 200

## 2019-07-26 NOTE — ED Notes (Signed)
+  ms ordered bfast

## 2019-07-26 NOTE — Progress Notes (Addendum)
PROGRESS NOTE  Julie Arias [id number] DOB: 24-Feb-1951 DOA: 07/25/2019 PCP: Mayra Neer, MD   LOS: 1 day   Brief narrative:  HPI: Julie Arias is a 68 y.o. female with medical history significant of partial colectomy with ileostomy due to diverticulitis, hypothyroidism, anxiety and GERD who presented with concerns of nausea, vomiting and dehydration. About a week and a half ago patient began to have symptoms of generalized body ache and subsequent nausea and vomiting with decrease PO intake. Noticed more stool output to ostomy bag.  Tested positive for COVID outpatient about a week ago. No shortness of breath or chest pain. No abdominal pain.  ED Course: She was afebrile and normotensive. Had desaturation on room air down to 88% and was placed on 2L with O2 of 94%. WBC of 11.2K, Hemoglobin of 17.3, platelet of 438. Sodium of 126, potassium 5.2, glucose of 131, creatinine of 1.75 from a prior of 0.63, AST of 43, ALT of 42, alkaline phosphatase of 201, Lipase 121,  anion gap of 16. Lactate of 3. CXR negative.  Assessment/Plan:  Active Problems:   Hypothyroidism   Dehydration   Gastroenteritis due to COVID-19 virus   Acute respiratory failure with hypoxia (HCC)   Hyponatremia   Hyperkalemia   AKI (acute kidney injury) (HCC)   Increased anion gap metabolic acidosis   Thrombocytosis (HCC)  Acute hypoxia due to COVID infection. Chest x-ray shows subsegmental atelectasis.  Continue IV Decadron,Remdesvir .  Patient was saturating 91% on room air on presentation.  Currently saturating 96% on room air.  Continue incentive spirometer.  Monitor inflammatory biomarkers.  Nausea/vomiting secondary to gastroenteritis due to COVID infection  Continue IV fluid hydration.  Mildly elevated.  Continue to trend.  Continue supportive care.  Continue as needed Zofran.  With IV fluids with  Hyponatremia Hypovolemic due to decrease PO intake .  Sodium of 126 presentation.  Improved to  133.  Monitor with IV fluids  Mild hyperkalemia K of 5.2 on presentation.  Trended up to 5.7.  We will continue hydration and repeat BMP in afternoon.  Acute kidney injury Creatinine of 1.75 patient received from prior of 0.63.  Continue to monitor closely.  Check BMP closely.  Creatinine has improved to 1.1 today.  Elevated anion gap From hypovolemia and AKI .continue with fluid resuscitation .  Decrease IV fluids .  Lactate was elevated and has trended down to 1.8.  Thrombocytosis reactive   Hypothyroidism Continue levothyroxine   Abnormal urinalysis with significant Eades cells and leukocytes.  Will obtain urine culture.  VTE Prophylaxis: Lovenox  Code Status:  Full   Family Communication: Discussed with the patient at bedside.  I also spoke with the patient's husband Julie Arias on the phone and updated him about the clinical condition of the patient.  Disposition Plan: Home .  Closely monitor oxygenation.  Continue remdesivir dexamethasone.  Check potassium in the afternoon.   Consultants:  None  Procedures:  None  Antibiotics: Anti-infectives (From admission, onward)   Start     Dose/Rate Route Frequency Ordered Stop   07/26/19 2200  remdesivir 100 mg in sodium chloride 0.9 % 100 mL IVPB     100 mg 200 mL/hr over 30 Minutes Intravenous Daily at bedtime 07/26/19 0240 07/30/19 2159   07/26/19 0015  remdesivir 200 mg in sodium chloride 0.9% 250 mL IVPB     200 mg 580 mL/hr over 30 Minutes Intravenous Once 07/26/19 0007 07/26/19 [id number]     Subjective: Today, patient  feels better with no nausea or vomiting.  Denies any abdominal pain.  Denies shortness of breath, dyspnea, chest pain.  Objective: Vitals:   07/26/19 0701 07/26/19 0900  BP: 107/77 109/72  Pulse: 71 77  Resp: 14 19  Temp:    SpO2: 98% 96%    Intake/Output Summary (Last 24 hours) at 07/26/2019 1038 Last data filed at 07/26/2019 0116 Gross per 24 hour  Intake 1000 ml  Output -  Net  1000 ml   There were no vitals filed for this visit. There is no height or weight on file to calculate BMI.   Physical Exam: GENERAL: Patient is alert awake and oriented. Not in obvious distress. HENT: No scleral pallor or icterus. Pupils equally reactive to light. Oral mucosa is moist NECK: is supple, no palpable thyroid enlargement. CHEST: Clear to auscultation. No crackles or wheezes. Non tender on palpation. Diminished breath sounds bilaterally. CVS: S1 and S2 heard, no murmur. Regular rate and rhythm. No pericardial rub. ABDOMEN: Soft, non-tender, bowel sounds are present.  Ostomy bag in the right lower quadrant with moderate amount of brown liquid EXTREMITIES: No edema.  CNS: Cranial nerves are intact. No focal motor or sensory deficits. SKIN: warm and dry without rashes.  Data Review: I have personally reviewed the following laboratory data and studies,  CBC: Recent Labs  Lab 07/25/19 1702 07/26/19 0740  WBC 11.2* 5.7  HGB 17.3* 14.4  HCT 50.2* 42.7  MCV 84.9 86.4  PLT 438* 0000000   Basic Metabolic Panel: Recent Labs  Lab 07/25/19 1702 07/26/19 0740  NA 126* 133*  K 5.2* 5.7*  CL 92* 103  CO2 18* 22  GLUCOSE 131* 131*  BUN 78* 51*  CREATININE 1.75* 1.13*  CALCIUM 9.6 8.9   Liver Function Tests: Recent Labs  Lab 07/25/19 1702  AST 43*  ALT 42  ALKPHOS 201*  BILITOT 1.0  PROT 8.3*  ALBUMIN 3.9   Recent Labs  Lab 07/25/19 1702  LIPASE 121*   No results for input(s): AMMONIA in the last 168 hours. Cardiac Enzymes: No results for input(s): CKTOTAL, CKMB, CKMBINDEX, TROPONINI in the last 168 hours. BNP (last 3 results) No results for input(s): BNP in the last 8760 hours.  ProBNP (last 3 results) No results for input(s): PROBNP in the last 8760 hours.  CBG: No results for input(s): GLUCAP in the last 168 hours. Recent Results (from the past 240 hour(s))  SARS CORONAVIRUS 2 (TAT 6-24 HRS) Nasopharyngeal Nasopharyngeal Swab     Status: Abnormal    Collection Time: 07/25/19 11:38 PM   Specimen: Nasopharyngeal Swab  Result Value Ref Range Status   SARS Coronavirus 2 POSITIVE (A) NEGATIVE Final    Comment: RESULT CALLED TO, READ BACK BY AND VERIFIED WITH: A.CAIN,RN [id number] 07/26/19 G.MCADOO (NOTE) SARS-CoV-2 target nucleic acids are DETECTED. The SARS-CoV-2 RNA is generally detectable in upper and lower respiratory specimens during the acute phase of infection. Positive results are indicative of the presence of SARS-CoV-2 RNA. Clinical correlation with patient history and other diagnostic information is  necessary to determine patient infection status. Positive results do not rule out bacterial infection or co-infection with other viruses.  The expected result is Negative. Fact Sheet for Patients: SugarRoll.be Fact Sheet for Healthcare Providers: https://www.woods-mathews.com/ This test is not yet approved or cleared by the [place] FDA and  has been authorized for detection and/or diagnosis of SARS-CoV-2 by FDA under an Emergency Use Authorization (EUA). This EUA will remain  in effect (meaning this test  can be used) for [employer] ration of the COVID-19 declaration under Section 564(b)(1) of the Act, 21 U.S.C. section 360bbb-3(b)(1), unless the authorization is terminated or revoked sooner. Performed at [facility], [address], [place], [place] [address]      Studies: DG Chest Port 1 View  Result Date: 07/25/2019 CLINICAL DATA:  Vomiting and 50 EXAM: PORTABLE CHEST 1 VIEW COMPARISON:  None. FINDINGS: The heart size and mediastinal contours are within normal limits. Aortic knob calcifications. Minimally increased interstitial markings seen at both lung bases, likely atelectasis. Aortic knob calcifications. No acute osseous abnormality. IMPRESSION: Subsegmental atelectasis of both lung bases. Electronically Signed   By: Prudencio Pair M.D.   On: 07/25/2019 23:40    Scheduled  Meds: . cyclobenzaprine  5 mg Oral TID  . dexamethasone (DECADRON) injection  6 mg Intravenous Q24H  . enoxaparin (LOVENOX) injection  30 mg Subcutaneous Q24H  . levothyroxine  175 mcg Oral Q0600  . pantoprazole  40 mg Oral Daily    Continuous Infusions: . sodium chloride 1,000 mL (07/26/19 0906)  . remdesivir 100 mg in NS 100 mL       Flora Lipps, MD  [facility] 07/26/2019

## 2019-07-26 NOTE — ED Notes (Signed)
UTO additional labs, phlebotomy notified and to obtain additional sampling.

## 2019-07-26 NOTE — ED Notes (Signed)
Pt ambulated well to the bathroom and back to the bed. She complained of a small amount of dizziness upon returning to bed that went away quickly. VSS

## 2019-07-26 NOTE — ED Notes (Signed)
Urine culture sen to lab with urine

## 2019-07-26 NOTE — H&P (Signed)
History and Physical    Julie Arias [id number] DOB: [birth year] DOA: 07/25/2019  PCP: Mayra Neer, MD  Patient coming from: Home  I have personally briefly reviewed patient's old medical records in [place]  Chief Complaint: nausea, vomiting  HPI: Julie Arias is a 68 y.o. female with medical history significant of Partial colectomy with ileostomy due to diverticulitis, hypothyroidism, anxiety and GERD who presents with concerns of nausea, vomiting and dehydration. About a week and a half ago patient began to have symptoms of generalized body ache and subsequent nausea and vomiting with decrease PO intake. Noticed more stool output to ostomy bag.  Tested positive for COVID outpatient about a week ago. No shortness of breath or chest pain. No abdominal pain.  ED Course: She was afebrile and normotensive. Had desaturation on room air down to 88% and was placed on 2L with O2 of 94%.  WBC of 11.2K, Hemoglobin of 17.3, platelet of 438  Sodium of 126, potassium 5.2, glucose of 131, creatinine of 1.75 from a prior of 0.63, AST of 43, ALT of 42, alkaline phosphatase of 201, Lipase 121,  anion gap of 16. Lactate of 3. CXR negative.  Review of Systems:  Constitutional:  No Fever ENT/Mouth: No sore throat, + Rhinorrhea Eyes: No Vision Changes Cardiovascular: No Chest Pain, no SOB Respiratory: No Cough, No Sputum Gastrointestinal: + Nausea, + Vomiting, + Diarrhea Genitourinary: no dysuria Musculoskeletal: No Arthralgias, + Myalgias Skin: No Skin Lesions, No Pruritus, Neuro: no Weakness, No Numbness Psych:  no decrease appetite Heme/Lymph: No Bruising, No Bleeding  Past Medical History:  Diagnosis Date  . Diverticulitis    caused "perforated colon" hospitalized- no surgery  . GERD (gastroesophageal reflux disease)   . Hypothyroidism   . Thyroid disease     Past Surgical History:  Procedure Laterality Date  . ABDOMINAL HYSTERECTOMY    . COLON RESECTION N/A  05/04/2019   Procedure: LAPAROSCOPIC SIGMOID COLON RESECTION;  Surgeon: Jules Husbands, MD;  Location: [facility] ORS;  Service: General;  Laterality: N/A;  . COLONOSCOPY W/ POLYPECTOMY    . COLONOSCOPY WITH PROPOFOL N/A 07/07/2015   Procedure: COLONOSCOPY WITH PROPOFOL;  Surgeon: Garlan Fair, MD;  Location: [facility] ENDOSCOPY;  Service: Endoscopy;  Laterality: N/A;  . COLONOSCOPY WITH PROPOFOL N/A 04/06/2019   Procedure: COLONOSCOPY WITH PROPOFOL;  Surgeon: Jonathon Bellows, MD;  Location: [facility] ENDOSCOPY;  Service: Gastroenterology;  Laterality: N/A;  . GANGLION CYST EXCISION  12/2018     reports that she quit smoking about 15 years ago. Her smoking use included cigarettes. She has a 25.00 pack-year smoking history. She has never used smokeless tobacco. She reports that she does not drink alcohol or use drugs.  Allergies  Allergen Reactions  . Aleve [Naproxen Sodium] Rash    Family History  Problem Relation Age of Onset  . Lung disease Father   . Lung cancer Brother      Prior to Admission medications   Medication Sig Start Date End Date Taking? Authorizing Provider  cyclobenzaprine (FLEXERIL) 5 MG tablet Take 1 tablet (5 mg total) by mouth 3 (three) times daily. 05/10/19  Yes Tylene Fantasia, PA-C  EUTHYROX 175 MCG tablet Take 175 mcg by mouth daily before breakfast.  11/29/18  Yes [provider]  ibuprofen (ADVIL) 200 MG tablet Take 400 mg by mouth 2 (two) times daily as needed for headache.   Yes [provider]  loperamide (IMODIUM) 2 MG capsule Take 1 capsule (2 mg total) by  mouth 2 (two) times daily. Patient taking differently: Take 2 mg by mouth 4 (four) times daily as needed for diarrhea or loose stools.  05/10/19  Yes Edison Simon R, PA-C  LORazepam (ATIVAN) 0.5 MG tablet Take 0.5 mg by mouth at bedtime as needed for anxiety or sleep.    Yes [provider]  omeprazole (PRILOSEC) 40 MG capsule Take 40 mg by mouth daily.   Yes [provider]    ondansetron (ZOFRAN) 4 MG tablet Take 1 tablet (4 mg total) by mouth every 8 (eight) hours as needed for nausea or vomiting. 05/16/19  Yes Tylene Fantasia, PA-C  traMADol (ULTRAM) 50 MG tablet Take 50 mg by mouth every 6 (six) hours as needed for moderate pain.    Yes [provider]    Physical Exam: Vitals:   07/25/19 2306 07/25/19 2315 07/25/19 2330 07/26/19 0000  BP: 128/76 (!) 138/121 125/76 115/71  Pulse: 95 99 (!) 105 86  Resp: 19 (!) 23 (!) 24 10  Temp: 97.8 F (36.6 C)     TempSrc: Oral     SpO2: 94% 93% 95% 96%    Constitutional: NAD, calm, comfortable, non-toxic appearing female laying in bed at 40 degree incline  Vitals:   07/25/19 2306 07/25/19 2315 07/25/19 2330 07/26/19 0000  BP: 128/76 (!) 138/121 125/76 115/71  Pulse: 95 99 (!) 105 86  Resp: 19 (!) 23 (!) 24 10  Temp: 97.8 F (36.6 C)     TempSrc: Oral     SpO2: 94% 93% 95% 96%   Eyes: PERRL, lids and conjunctivae normal ENMT: Mucous membranes are moist.  Neck: normal, supple Respiratory: clear to auscultation bilaterally, no wheezing, no crackles. Normal respiratory effort on 2L via nasal cannula. No accessory muscle use.  Cardiovascular: Regular rate and rhythm, no murmurs / rubs / gallops. No extremity edema.  Abdomen: no tenderness, no masses palpated. Ostomy bag in right lower quadrant with moderate amount of brown liquid  Bowel sounds positive.  Musculoskeletal: no clubbing / cyanosis. No joint deformity upper and lower extremities. Good ROM, no contractures. Normal muscle tone.  Skin: no rashes, lesions, ulcers. No induration Neurologic: CN 2-12 grossly intact. Sensation intact. Strength 5/5 in all 4.  Psychiatric: Normal judgment and insight. Alert and oriented x 3. Normal mood.     Labs on Admission: I have personally reviewed following labs and imaging studies  CBC: Recent Labs  Lab 07/25/19 1702  WBC 11.2*  HGB 17.3*  HCT 50.2*  MCV 84.9  PLT 99991111*   Basic Metabolic  Panel: Recent Labs  Lab 07/25/19 1702  NA 126*  K 5.2*  CL 92*  CO2 18*  GLUCOSE 131*  BUN 78*  CREATININE 1.75*  CALCIUM 9.6   GFR: Estimated Creatinine Clearance: 26.2 mL/min (A) (by C-G formula based on SCr of 1.75 mg/dL (H)). Liver Function Tests: Recent Labs  Lab 07/25/19 1702  AST 43*  ALT 42  ALKPHOS 201*  BILITOT 1.0  PROT 8.3*  ALBUMIN 3.9   Recent Labs  Lab 07/25/19 1702  LIPASE 121*   No results for input(s): AMMONIA in the last 168 hours. Coagulation Profile: No results for input(s): INR, PROTIME in the last 168 hours. Cardiac Enzymes: No results for input(s): CKTOTAL, CKMB, CKMBINDEX, TROPONINI in the last 168 hours. BNP (last 3 results) No results for input(s): PROBNP in the last 8760 hours. HbA1C: No results for input(s): HGBA1C in the last 72 hours. CBG: No results for input(s): GLUCAP in  the last 168 hours. Lipid Profile: No results for input(s): CHOL, HDL, LDLCALC, TRIG, CHOLHDL, LDLDIRECT in the last 72 hours. Thyroid Function Tests: No results for input(s): TSH, T4TOTAL, FREET4, T3FREE, THYROIDAB in the last 72 hours. Anemia Panel: No results for input(s): VITAMINB12, FOLATE, FERRITIN, TIBC, IRON, RETICCTPCT in the last 72 hours. Urine analysis:    Component Value Date/Time   COLORURINE YELLOW 02/04/2019 2040   APPEARANCEUR CLEAR 02/04/2019 2040   LABSPEC 1.012 02/04/2019 2040   PHURINE 5.0 02/04/2019 2040   GLUCOSEU NEGATIVE 02/04/2019 2040   HGBUR SMALL (A) 02/04/2019 2040   BILIRUBINUR NEGATIVE 02/04/2019 2040   KETONESUR 5 (A) 02/04/2019 2040   PROTEINUR NEGATIVE 02/04/2019 2040   UROBILINOGEN 1.0 07/07/2010 2000   NITRITE NEGATIVE 02/04/2019 2040   LEUKOCYTESUR NEGATIVE 02/04/2019 2040    Radiological Exams on Admission: DG Chest Port 1 View  Result Date: 07/25/2019 CLINICAL DATA:  Vomiting and 50 EXAM: PORTABLE CHEST 1 VIEW COMPARISON:  None. FINDINGS: The heart size and mediastinal contours are within normal limits.  Aortic knob calcifications. Minimally increased interstitial markings seen at both lung bases, likely atelectasis. Aortic knob calcifications. No acute osseous abnormality. IMPRESSION: Subsegmental atelectasis of both lung bases. Electronically Signed   By: Prudencio Pair M.D.   On: 07/25/2019 23:40    EKG: Independently reviewed.   Assessment/Plan  Acute hypoxia due to COVID infection IV Decadron Remdesvir   Gastroenteritis due to COVID infection  Has mildly elevated LFTS and Lipase but no abdominal pain and benign abdomen exam- not suggestive of pancreatitis  Repeat Lipase in the morning PRN Zofran for nausea Continuous IV fluids  Hyponatremia Hypovolemic due to decrease PO intake  Monitor with fluids  Mild hyperkalemia K of 5.2 Repeat   Acute kidney injury Creatinine of 1.75 from prior of 0.63  Elevated anion gap From hypovolemia and AKI  Follow with fluid resuscitation   Thrombocytosis reactive   Hypothyroidism Continue levothyroxine   DVT prophylaxis:.Lovenox Code Status:Full Family Communication: Plan discussed with patient at bedside  disposition Plan: Home with at least 2 midnight stays  Consults called:  Admission status: inpatient   Demonie Kassa T Santita Hunsberger DO [facility]   If 7PM-7AM, please contact night-coverage [email or web address] Password [facility]  07/26/2019, 12:42 AM

## 2019-07-26 NOTE — ED Notes (Signed)
Lab to assist with blood draws this morning due to pt being difficult stick

## 2019-07-26 NOTE — ED Notes (Signed)
Ordered lunch tray 

## 2019-07-27 LAB — COMPREHENSIVE METABOLIC PANEL
ALT: 27 U/L (ref 0–44)
AST: 24 U/L (ref 15–41)
Albumin: 2.8 g/dL — ABNORMAL LOW (ref 3.5–5.0)
Alkaline Phosphatase: 120 U/L (ref 38–126)
Anion gap: 10 (ref 5–15)
BUN: 23 mg/dL (ref 8–23)
CO2: 22 mmol/L (ref 22–32)
Calcium: 8.9 mg/dL (ref 8.9–10.3)
Chloride: 102 mmol/L (ref 98–111)
Creatinine, Ser: 0.93 mg/dL (ref 0.44–1.00)
GFR calc Af Amer: >60 mL/min (ref >60)
GFR calc non Af Amer: >60 mL/min (ref >60)
Glucose, Bld: 120 mg/dL — ABNORMAL HIGH (ref 70–99)
Potassium: 4.8 mmol/L (ref 3.5–5.1)
Sodium: 134 mmol/L — ABNORMAL LOW (ref 135–145)
Total Bilirubin: 0.4 mg/dL (ref 0.3–1.2)
Total Protein: 6.1 g/dL — ABNORMAL LOW (ref 6.5–8.1)

## 2019-07-27 LAB — FERRITIN: Ferritin: 201 ng/mL (ref 11–307)

## 2019-07-27 LAB — C-REACTIVE PROTEIN: CRP: 4 mg/dL — ABNORMAL HIGH (ref <1.0)

## 2019-07-27 LAB — PROCALCITONIN: Procalcitonin: <0.1 ng/mL

## 2019-07-27 LAB — LIPASE, BLOOD: Lipase: 129 U/L — ABNORMAL HIGH (ref 11–51)

## 2019-07-27 LAB — LACTATE DEHYDROGENASE: LDH: 211 U/L — ABNORMAL HIGH (ref 98–192)

## 2019-07-27 LAB — D-DIMER, QUANTITATIVE: D-Dimer, Quant: 1.7 ug/mL-FEU — ABNORMAL HIGH (ref 0.00–0.50)

## 2019-07-27 MED ORDER — ENOXAPARIN SODIUM 40 MG/0.4ML ~~LOC~~ SOLN
40.0000 mg | SUBCUTANEOUS | Status: DC
  Administered 2019-07-28 – 2019-07-29 (×2): 40 mg via SUBCUTANEOUS
  Filled 2019-07-27 (×2): qty 0.4

## 2019-07-27 NOTE — ED Notes (Signed)
Lab called stated total bilirubin 0.4.

## 2019-07-27 NOTE — Progress Notes (Signed)
PROGRESS NOTE  Julie Arias [id number] DOB: 06-Jul-1951 DOA: 07/25/2019 PCP: Mayra Neer, MD   LOS: 2 days   Brief narrative:  HPI: Julie Arias is a 68 y.o. female with medical history significant of partial colectomy with ileostomy due to diverticulitis, hypothyroidism, anxiety and GERD who presented with concerns of nausea, vomiting and dehydration. About a week and a half ago patient began to have symptoms of generalized body ache and subsequent nausea and vomiting with decrease PO intake. Noticed more stool output to ostomy bag.  Tested positive for COVID outpatient about a week ago. No shortness of breath or chest pain. No abdominal pain.  ED Course: She was afebrile and normotensive. Had desaturation on room air down to 88% and was placed on 2L with O2 of 94%. WBC of 11.2K, Hemoglobin of 17.3, platelet of 438. Sodium of 126, potassium 5.2, glucose of 131, creatinine of 1.75 from a prior of 0.63, AST of 43, ALT of 42, alkaline phosphatase of 201, Lipase 121,  anion gap of 16. Lactate of 3. CXR showed atelectasis.   Assessment/Plan:  Active Problems:   Hypothyroidism   Dehydration   Gastroenteritis due to COVID-19 virus   Acute respiratory failure with hypoxia (HCC)   Hyponatremia   Hyperkalemia   AKI (acute kidney injury) (HCC)   Increased anion gap metabolic acidosis   Thrombocytosis ([place])   COVID-19 virus infection  Acute hypoxia due to COVID infection. Chest x-ray shows subsegmental atelectasis.  Continue IV Decadron,Remdesvir .  Patient was saturating 91% on room air on presentation.  Currently saturating 96% on 2 L of nasal cannula.  Continue incentive spirometer.  Will get inflammatory markers stat, orders placed in yesterday   COVID-19 Labs  No results for input(s): DDIMER, FERRITIN, LDH, CRP in the last 72 hours.  Lab Results  Component Value Date   [place] (A) 07/25/2019   [place] NEGATIVE 05/02/2019   [place] NEGATIVE  04/03/2019   [place] NEGATIVE 02/04/2019    Nausea/vomiting secondary to gastroenteritis due to COVID infection  Will encourage oral fluids.  Discontinue IV secondary to pneumonia with Covid.  Hyponatremia Hypovolemic due to decrease PO intake .  Sodium of 126 presentation.  Improved to 134.  Encourage oral fluids.  Mild hyperkalemia K of 5.2 on presentation.  Trended up to 5.7.  We will continue hydration and repeat BMP in afternoon.  Acute kidney injury Creatinine of 1.75 on presentation.  Creatinine has significantly improved.  Creatinine of 0.9 today.  Elevated anion gap From hypovolemia and AKI, improved.  Anion gap of 10 at this time.  Thrombocytosis reactive   Hypothyroidism Continue levothyroxine   Abnormal urinalysis with significant Schlink cells and leukocytes.  Will obtain urine culture.  Blood culture negative in 1 day.  Hold off with antibiotic for now.  VTE Prophylaxis: Lovenox  Code Status:  Full   Family Communication:  None today.  I also spoke with the patient's husband Mr [name] on the phone yesterday.  Disposition Plan: Home .  Closely monitor for oxygenation.  Continue remdesivir dexamethasone.  Check a Covid labs stat.  Check urine culture stat.   Consultants:  None  Procedures:  None  Antibiotics: Anti-infectives (From admission, onward)   Start     Dose/Rate Route Frequency Ordered Stop   07/26/19 2200  remdesivir 100 mg in sodium chloride 0.9 % 100 mL IVPB     100 mg 200 mL/hr over 30 Minutes Intravenous Daily at bedtime 07/26/19 0240 07/30/19 2159  07/26/19 0015  remdesivir 200 mg in sodium chloride 0.9% 250 mL IVPB     200 mg 580 mL/hr over 30 Minutes Intravenous Once 07/26/19 0007 07/26/19 [id number]     Subjective: Today, patient denies any nausea, vomiting or abdominal pain.  Denies shortness of breath, cough or congestion.  She does have a increasing output at the colostomy.  Objective: Vitals:   07/27/19 0800 07/27/19  1003  BP: 114/68 126/79  Pulse: 62 76  Resp: 15 18  Temp:    SpO2: 97% 96%    Intake/Output Summary (Last 24 hours) at 07/27/2019 1128 Last data filed at 07/26/2019 1909 Gross per 24 hour  Intake 435 ml  Output --  Net 435 ml   There were no vitals filed for this visit. There is no height or weight on file to calculate BMI.   Physical Exam: GENERAL: Patient is alert awake and oriented. Not in obvious distress.  On 2 L of nasal cannula oxygen. HENT: No scleral pallor or icterus. Pupils equally reactive to light. Oral mucosa is moist NECK: is supple, no palpable thyroid enlargement. CHEST: Clear to auscultation. No crackles or wheezes. Non tender on palpation. Diminished breath sounds bilaterally.  No obvious crackles or wheezes noted. CVS: S1 and S2 heard, no murmur. Regular rate and rhythm. No pericardial rub. ABDOMEN: Soft, non-tender, bowel sounds are present.  Ostomy bag in the right lower quadrant  EXTREMITIES: No edema.  CNS: Cranial nerves are intact. No focal motor or sensory deficits. SKIN: warm and dry without rashes.  Data Review: I have personally reviewed the following laboratory data and studies,  CBC: Recent Labs  Lab 07/25/19 1702 07/26/19 0740  WBC 11.2* 5.7  HGB 17.3* 14.4  HCT 50.2* 42.7  MCV 84.9 86.4  PLT 438* 0000000   Basic Metabolic Panel: Recent Labs  Lab 07/25/19 1702 07/26/19 0740 07/26/19 1632 07/27/19 0637  NA 126* 133* 133* 134*  K 5.2* 5.7* 4.3 4.8  CL 92* 103 103 102  CO2 18* 22 19* 22  GLUCOSE 131* 131* 83 120*  BUN 78* 51* 31* 23  CREATININE 1.75* 1.13* 0.88 0.93  CALCIUM 9.6 8.9 8.6* 8.9   Liver Function Tests: Recent Labs  Lab 07/25/19 1702 07/26/19 0900 07/27/19 0637  AST 43* 28 24  ALT 42 32 27  ALKPHOS 201* 152* 120  BILITOT 1.0 0.2* 0.4  PROT 8.3* 7.1 6.1*  ALBUMIN 3.9 3.1* 2.8*   Recent Labs  Lab 07/25/19 1702 07/27/19 0637  LIPASE 121* 129*   No results for input(s): AMMONIA in the last 168  hours. Cardiac Enzymes: No results for input(s): CKTOTAL, CKMB, CKMBINDEX, TROPONINI in the last 168 hours. BNP (last 3 results) No results for input(s): BNP in the last 8760 hours.  ProBNP (last 3 results) No results for input(s): PROBNP in the last 8760 hours.  CBG: No results for input(s): GLUCAP in the last 168 hours. Recent Results (from the past 240 hour(s))  Culture, blood (routine x 2)     Status: None (Preliminary result)   Collection Time: 07/25/19 11:10 PM   Specimen: BLOOD LEFT ARM  Result Value Ref Range Status   Specimen Description BLOOD LEFT ARM  Final   Special Requests   Final    BOTTLES DRAWN AEROBIC AND ANAEROBIC Blood Culture results may not be optimal due to an inadequate volume of blood received in culture bottles   Culture   Final    NO GROWTH 1 DAY Performed at [facility]  Lab, [address] [place], [place] [address]    Report Status PENDING  Incomplete  SARS CORONAVIRUS 2 (TAT 6-24 HRS) Nasopharyngeal Nasopharyngeal Swab     Status: Abnormal   Collection Time: 07/25/19 11:38 PM   Specimen: Nasopharyngeal Swab  Result Value Ref Range Status   SARS Coronavirus 2 POSITIVE (A) NEGATIVE Final    Comment: RESULT CALLED TO, READ BACK BY AND VERIFIED WITH: A.CAIN,RN [id number] 07/26/19 G.MCADOO (NOTE) SARS-CoV-2 target nucleic acids are DETECTED. The SARS-CoV-2 RNA is generally detectable in upper and lower respiratory specimens during the acute phase of infection. Positive results are indicative of the presence of SARS-CoV-2 RNA. Clinical correlation with patient history and other diagnostic information is  necessary to determine patient infection status. Positive results do not rule out bacterial infection or co-infection with other viruses.  The expected result is Negative. Fact Sheet for Patients: SugarRoll.be Fact Sheet for Healthcare Providers: https://www.woods-mathews.com/ This test is not yet approved or  cleared by the [place] FDA and  has been authorized for detection and/or diagnosis of SARS-CoV-2 by FDA under an Emergency Use Authorization (EUA). This EUA will remain  in effect (meaning this test can be used) for the du ration of the COVID-19 declaration under Section 564(b)(1) of the Act, 21 U.S.C. section 360bbb-3(b)(1), unless the authorization is terminated or revoked sooner. Performed at [facility], [place] [address], [place], [place] [address]      Studies: DG Chest Port 1 View  Result Date: 07/25/2019 CLINICAL DATA:  Vomiting and 50 EXAM: PORTABLE CHEST 1 VIEW COMPARISON:  None. FINDINGS: The heart size and mediastinal contours are within normal limits. Aortic knob calcifications. Minimally increased interstitial markings seen at both lung bases, likely atelectasis. Aortic knob calcifications. No acute osseous abnormality. IMPRESSION: Subsegmental atelectasis of both lung bases. Electronically Signed   By: Prudencio Pair M.D.   On: 07/25/2019 23:40    Scheduled Meds: . cyclobenzaprine  5 mg Oral TID  . dexamethasone (DECADRON) injection  6 mg Intravenous Q24H  . enoxaparin (LOVENOX) injection  30 mg Subcutaneous Q24H  . levothyroxine  175 mcg Oral Q0600  . pantoprazole  40 mg Oral Daily    Continuous Infusions: . remdesivir 100 mg in NS 100 mL Stopped (07/26/19 2254)     Flora Lipps, MD  [facility] 07/27/2019

## 2019-07-27 NOTE — ED Notes (Signed)
ED TO INPATIENT HANDOFF REPORT  ED Nurse Name and Phone #: [phone] Lauree Chandler., RN  S Name/Age/Gender Julie Arias 68 y.o. female Room/Bed: 009C/009C  Code Status   Code Status: Full Code  Home/SNF/Other Home Patient oriented to: self, place, time and situation Is this baseline? Yes   Triage Complete: Triage complete  Chief Complaint Dehydration [E86.0] COVID-19 virus infection [U07.1]  Triage Note Pt reports 3 weeks of generalized fatigue, nausea and no appetite, pt tested positive for COVID 1 week ago. Pt has ostomy bag in place. Pt a.o, denies cp/sob.     Allergies Allergies  Allergen Reactions  . Aleve [Naproxen Sodium] Rash    Level of Care/Admitting Diagnosis ED Disposition    ED Disposition Condition Comment   Admit  Hospital Area: [facility] [[id number]]  Level of Care: Med-Surg [16]  Covid Evaluation: Confirmed COVID Positive  Diagnosis: COVID-19 virus infection [id number]  Admitting Physician: Orene Desanctis [id number]  Attending Physician: Flora Lipps [id number]  Estimated length of stay: past midnight tomorrow  Certification:: I certify this patient will need inpatient services for at least 2 midnights       B Medical/Surgery History Past Medical History:  Diagnosis Date  . Diverticulitis    caused "perforated colon" hospitalized- no surgery  . GERD (gastroesophageal reflux disease)   . Hypothyroidism   . Thyroid disease    Past Surgical History:  Procedure Laterality Date  . ABDOMINAL HYSTERECTOMY    . COLON RESECTION N/A 05/04/2019   Procedure: LAPAROSCOPIC SIGMOID COLON RESECTION;  Surgeon: Jules Husbands, MD;  Location: [facility] ORS;  Service: General;  Laterality: N/A;  . COLONOSCOPY W/ POLYPECTOMY    . COLONOSCOPY WITH PROPOFOL N/A 07/07/2015   Procedure: COLONOSCOPY WITH PROPOFOL;  Surgeon: Garlan Fair, MD;  Location: [facility] ENDOSCOPY;  Service: Endoscopy;  Laterality: N/A;  . COLONOSCOPY WITH PROPOFOL N/A 04/06/2019    Procedure: COLONOSCOPY WITH PROPOFOL;  Surgeon: Jonathon Bellows, MD;  Location: [facility] ENDOSCOPY;  Service: Gastroenterology;  Laterality: N/A;  . GANGLION CYST EXCISION  12/2018     A IV Location/Drains/Wounds Patient Lines/Drains/Airways Status   Active Line/Drains/Airways    Name:   Placement date:   Placement time:   Site:   Days:   Peripheral IV 07/25/19 Left;Posterior Forearm   07/25/19    2324    Forearm   2   Colostomy RLQ   05/04/19    1846    RLQ   84   Incision (Closed) 05/04/19 Abdomen   05/04/19    1911     84   Incision (Closed) 05/04/19 Abdomen Right   05/04/19    1920     84          Intake/Output Last 24 hours No intake or output data in the 24 hours ending 07/27/19 2354  Labs/Imaging Results for orders placed or performed during the hospital encounter of 07/25/19 (from the past 48 hour(s))  Urinalysis, Routine w reflex microscopic     Status: Abnormal   Collection Time: 07/26/19  6:46 AM  Result Value Ref Range   Color, Urine YELLOW YELLOW   APPearance CLOUDY (A) CLEAR   Specific Gravity, Urine 1.024 1.005 - 1.030   pH 5.0 5.0 - 8.0   Glucose, UA NEGATIVE NEGATIVE mg/dL   Hgb urine dipstick SMALL (A) NEGATIVE   Bilirubin Urine NEGATIVE NEGATIVE   Ketones, ur 5 (A) NEGATIVE mg/dL   Protein, ur NEGATIVE NEGATIVE mg/dL   Nitrite NEGATIVE NEGATIVE  Leukocytes,Ua LARGE (A) NEGATIVE   RBC / HPF 11-20 0 - 5 RBC/hpf   WBC, UA >50 (H) 0 - 5 WBC/hpf   Bacteria, UA MANY (A) NONE SEEN   Squamous Epithelial / LPF 0-5 0 - 5   Mucus PRESENT    Hyaline Casts, UA PRESENT     Comment: Performed at [facility], [place] [address] [place], [place] [id number]  Basic metabolic panel     Status: Abnormal   Collection Time: 07/26/19  7:40 AM  Result Value Ref Range   Sodium 133 (L) 135 - 145 mmol/L   Potassium 5.7 (H) 3.5 - 5.1 mmol/L   Chloride 103 98 - 111 mmol/L   CO2 22 22 - 32 mmol/L   Glucose, Bld 131 (H) 70 - 99 mg/dL   BUN 51 (H) 8 - 23 mg/dL   Creatinine, Ser  1.13 (H) 0.44 - 1.00 mg/dL   Calcium 8.9 8.9 - 10.3 mg/dL   GFR calc non Af Amer 51 (L) >60 mL/min   GFR calc Af Amer 59 (L) >60 mL/min   Anion gap 8 5 - 15    Comment: Performed at [facility] [address] [place] [address]  CBC     Status: None   Collection Time: 07/26/19  7:40 AM  Result Value Ref Range   WBC 5.7 4.0 - 10.5 K/uL   RBC 4.94 3.87 - 5.11 MIL/uL   Hemoglobin 14.4 12.0 - 15.0 g/dL   HCT 42.7 36.0 - 46.0 %   MCV 86.4 80.0 - 100.0 fL   MCH 29.1 26.0 - 34.0 pg   MCHC 33.7 30.0 - 36.0 g/dL   RDW 12.9 11.5 - 15.5 %   Platelets 324 150 - 400 K/uL   nRBC 0.0 0.0 - 0.2 %    Comment: Performed at [facility], [place] [address], [place], [place] [address]  Troponin I (High Sensitivity)     Status: None   Collection Time: 07/26/19  7:40 AM  Result Value Ref Range   Troponin I (High Sensitivity) 5 <18 ng/L    Comment: (NOTE) Elevated high sensitivity troponin I (hsTnI) values and significant  changes across serial measurements may suggest ACS but many other  chronic and acute conditions are known to elevate hsTnI results.  Refer to the "Links" section for chest pain algorithms and additional  guidance. Performed at [facility], [place] [address] [place], [place] [address]   Lactic acid, plasma     Status: None   Collection Time: 07/26/19  8:06 AM  Result Value Ref Range   Lactic Acid, Venous 1.8 0.5 - 1.9 mmol/L    Comment: Performed at [facility] [address], [place], [place] [address]  Hepatic function panel     Status: Abnormal   Collection Time: 07/26/19  9:00 AM  Result Value Ref Range   Total Protein 7.1 6.5 - 8.1 g/dL   Albumin 3.1 (L) 3.5 - 5.0 g/dL   AST 28 15 - 41 U/L   ALT 32 0 - 44 U/L   Alkaline Phosphatase 152 (H) 38 - 126 U/L   Total Bilirubin 0.2 (L) 0.3 - 1.2 mg/dL   Bilirubin, Direct <0.1 0.0 - 0.2 mg/dL   Indirect Bilirubin NOT CALCULATED 0.3 - 0.9 mg/dL    Comment: Performed at [facility] [address] [place],  [id number]  Basic metabolic panel     Status:  Abnormal   Collection Time: 07/26/19  4:32 PM  Result Value Ref Range   Sodium 133 (L) 135 - 145 mmol/L   Potassium 4.3 3.5 - 5.1 mmol/L   Chloride 103 98 - 111 mmol/L   CO2 19 (L) 22 - 32 mmol/L   Glucose, Bld 83 70 - 99 mg/dL   BUN 31 (H) 8 - 23 mg/dL   Creatinine, Ser 0.88 0.44 - 1.00 mg/dL   Calcium 8.6 (L) 8.9 - 10.3 mg/dL   GFR calc non Af Amer >60 >60 mL/min   GFR calc Af Amer >60 >60 mL/min   Anion gap 11 5 - 15    Comment: Performed at [facility], [place] [address] [place], [place] [address]  Comprehensive metabolic panel     Status: Abnormal   Collection Time: 07/27/19  6:37 AM  Result Value Ref Range   Sodium 134 (L) 135 - 145 mmol/L   Potassium 4.8 3.5 - 5.1 mmol/L   Chloride 102 98 - 111 mmol/L   CO2 22 22 - 32 mmol/L   Glucose, Bld 120 (H) 70 - 99 mg/dL   BUN 23 8 - 23 mg/dL   Creatinine, Ser 0.93 0.44 - 1.00 mg/dL   Calcium 8.9 8.9 - 10.3 mg/dL   Total Protein 6.1 (L) 6.5 - 8.1 g/dL   Albumin 2.8 (L) 3.5 - 5.0 g/dL   AST 24 15 - 41 U/L   ALT 27 0 - 44 U/L   Alkaline Phosphatase 120 38 - 126 U/L   Total Bilirubin 0.4 0.3 - 1.2 mg/dL    Comment: CORRECTED RESULTS CALLED TO: G.NIKOLICH,RN [id number] CLARK,S CORRECTED ON 12/25 AT 0846: PREVIOUSLY REPORTED AS QUANTITY NOT SUFFICIENT, UNABLE TO PERFORM TEST    GFR calc non Af Amer >60 >60 mL/min   GFR calc Af Amer >60 >60 mL/min   Anion gap 10 5 - 15    Comment: Performed at [facility], [place] [address], [place], [place] [address]  Lipase, blood     Status: Abnormal   Collection Time: 07/27/19  6:37 AM  Result Value Ref Range   Lipase 129 (H) 11 - 51 U/L    Comment: Performed at [facility], [place] [address], [place], [place] [address]  C-reactive protein     Status: Abnormal   Collection Time: 07/27/19 11:30 AM  Result Value Ref Range   CRP 4.0 (H) <1.0 mg/dL    Comment: Performed at [facility] [address], [place], [place] [address]  D-dimer, quantitative     Status: Abnormal   Collection Time: 07/27/19 11:30 AM  Result Value Ref Range   D-Dimer, Quant 1.70 (H) 0.00 - 0.50 ug/mL-FEU    Comment: (NOTE) At the manufacturer cut-off of 0.50 ug/mL FEU, this assay has been documented to exclude PE with a sensitivity and negative predictive value of 97 to 99%.  At this time, this assay has not been approved by the FDA to exclude DVT/VTE. Results should be correlated with clinical presentation. Performed at [facility], [place] [address] [place], [place] [address]   Ferritin     Status: None   Collection Time: 07/27/19 11:30 AM  Result Value Ref Range   Ferritin 201 11 - 307 ng/mL    Comment: Performed at [facility] [address], [place], [place] [address]  Lactate dehydrogenase     Status: Abnormal   Collection Time: 07/27/19 11:30 AM  Result Value Ref Range   LDH 211 (H) 98 - 192 U/L    Comment: Performed at [facility], [place] [address], [place], [place] [address]  Procalcitonin     Status: None   Collection Time: 07/27/19 11:30 AM  Result Value Ref Range   Procalcitonin <0.10 ng/mL    Comment:        Interpretation: PCT (Procalcitonin) <= 0.5 ng/mL: Systemic infection (sepsis) is not likely. Local bacterial infection is possible. (NOTE)       Sepsis PCT Algorithm           Lower Respiratory Tract                                      Infection PCT Algorithm    ----------------------------     ----------------------------         PCT < 0.25 ng/mL                PCT < 0.10 ng/mL         Strongly encourage             Strongly discourage   discontinuation of antibiotics    initiation of antibiotics    ----------------------------     -----------------------------       PCT 0.25 - 0.50 ng/mL            PCT 0.10 - 0.25 ng/mL               OR       >80% decrease in PCT            Discourage initiation of                                             antibiotics      Encourage discontinuation           of antibiotics    ----------------------------     -----------------------------         PCT >= 0.50 ng/mL              PCT 0.26 - 0.50 ng/mL               AND        <80% decrease in PCT             Encourage initiation of                                             antibiotics       Encourage continuation           of antibiotics    ----------------------------     -----------------------------        PCT >= 0.50 ng/mL                  PCT > 0.50 ng/mL               AND         increase in PCT                  Strongly encourage  initiation of antibiotics    Strongly encourage escalation           of antibiotics                                     -----------------------------                                           PCT <= 0.25 ng/mL                                                 OR                                        > 80% decrease in PCT                                     Discontinue / Do not initiate                                             antibiotics Performed at [facility], [address], [place], [place] [address]    No results found.  Pending Labs Unresulted Labs (From admission, onward)    Start     Ordered   07/28/19 0500  CBC with Differential/Platelet  Daily,   R     07/27/19 1138   07/27/19 1135  Culture, Urine  ONCE - STAT,   STAT     07/27/19 1134   07/27/19 0500  Comprehensive metabolic panel  Daily,   R     07/26/19 0821   07/25/19 2248  Culture, blood (routine x 2)  BLOOD CULTURE X 2,   STAT     07/25/19 2247          Vitals/Pain Today's Vitals   07/27/19 1930 07/27/19 2000 07/27/19 2237 07/27/19 2300  BP:  106/76  106/63  Pulse:  74  71  Resp:  19  18  Temp:      TempSrc:      SpO2:  100%  100%  PainSc: 5   Asleep     Isolation Precautions Airborne and Contact precautions  Medications Medications  dexamethasone (DECADRON)  injection 6 mg (6 mg Intravenous Given 07/27/19 0037)  ondansetron (ZOFRAN) injection 4 mg (4 mg Intravenous Given 07/26/19 2012)  traMADol (ULTRAM) tablet 50 mg (50 mg Oral Given 07/27/19 2110)  LORazepam (ATIVAN) tablet 0.5 mg (0.5 mg Oral Given 07/27/19 2110)  levothyroxine (SYNTHROID) tablet 175 mcg (175 mcg Oral Given 07/27/19 0544)  pantoprazole (PROTONIX) EC tablet 40 mg (40 mg Oral Given 07/27/19 1027)  cyclobenzaprine (FLEXERIL) tablet 5 mg (5 mg Oral Given 07/27/19 2110)  remdesivir 100 mg in sodium chloride 0.9 % 100 mL IVPB (0 mg Intravenous Stopped 07/27/19 2237)  enoxaparin (LOVENOX) injection 40 mg (has no administration in time range)  sodium chloride 0.9 % bolus  1,000 mL (0 mLs Intravenous Stopped 07/26/19 0116)  HYDROmorphone (DILAUDID) injection 0.5 mg (0.5 mg Intravenous Given 07/25/19 2329)  ondansetron (ZOFRAN) injection 4 mg (4 mg Intravenous Given 07/25/19 2330)  remdesivir 200 mg in sodium chloride 0.9% 250 mL IVPB (0 mg Intravenous Stopped 07/26/19 0212)    Mobility walks Low fall risk   Focused Assessments Pulmonary Assessment Handoff:  Lung sounds: Bilateral Breath Sounds: Diminished L Breath Sounds: Diminished R Breath Sounds: Diminished O2 Device: Nasal Cannula O2 Flow Rate (L/min): 2 L/min      R Recommendations: See Admitting Provider Note  Report given to:   Additional Notes:

## 2019-07-27 NOTE — ED Notes (Signed)
Lunch Tray Ordered @ 1033. 

## 2019-07-28 LAB — COMPREHENSIVE METABOLIC PANEL
ALT: 33 U/L (ref 0–44)
AST: 31 U/L (ref 15–41)
Albumin: 2.8 g/dL — ABNORMAL LOW (ref 3.5–5.0)
Alkaline Phosphatase: 118 U/L (ref 38–126)
Anion gap: 8 (ref 5–15)
BUN: 14 mg/dL (ref 8–23)
CO2: 22 mmol/L (ref 22–32)
Calcium: 8.3 mg/dL — ABNORMAL LOW (ref 8.9–10.3)
Chloride: 105 mmol/L (ref 98–111)
Creatinine, Ser: 0.82 mg/dL (ref 0.44–1.00)
GFR calc Af Amer: >60 mL/min (ref >60)
GFR calc non Af Amer: >60 mL/min (ref >60)
Glucose, Bld: 132 mg/dL — ABNORMAL HIGH (ref 70–99)
Potassium: 4.2 mmol/L (ref 3.5–5.1)
Sodium: 135 mmol/L (ref 135–145)
Total Bilirubin: 0.4 mg/dL (ref 0.3–1.2)
Total Protein: 5.7 g/dL — ABNORMAL LOW (ref 6.5–8.1)

## 2019-07-28 LAB — CBC WITH DIFFERENTIAL/PLATELET
Abs Immature Granulocytes: 0.15 10*3/uL — ABNORMAL HIGH (ref 0.00–0.07)
Basophils Absolute: 0 10*3/uL (ref 0.0–0.1)
Basophils Relative: 0 %
Eosinophils Absolute: 0 10*3/uL (ref 0.0–0.5)
Eosinophils Relative: 1 %
HCT: 38.3 % (ref 36.0–46.0)
Hemoglobin: 13 g/dL (ref 12.0–15.0)
Immature Granulocytes: 2 %
Lymphocytes Relative: 9 %
Lymphs Abs: 0.7 10*3/uL (ref 0.7–4.0)
MCH: 29.3 pg (ref 26.0–34.0)
MCHC: 33.9 g/dL (ref 30.0–36.0)
MCV: 86.5 fL (ref 80.0–100.0)
Monocytes Absolute: 0.4 10*3/uL (ref 0.1–1.0)
Monocytes Relative: 5 %
Neutro Abs: 6.9 10*3/uL (ref 1.7–7.7)
Neutrophils Relative %: 83 %
Platelets: 283 10*3/uL (ref 150–400)
RBC: 4.43 MIL/uL (ref 3.87–5.11)
RDW: 12.8 % (ref 11.5–15.5)
WBC: 8.2 10*3/uL (ref 4.0–10.5)
nRBC: 0 % (ref 0.0–0.2)

## 2019-07-28 LAB — C-REACTIVE PROTEIN: CRP: 1.6 mg/dL — ABNORMAL HIGH (ref <1.0)

## 2019-07-28 LAB — FERRITIN: Ferritin: 226 ng/mL (ref 11–307)

## 2019-07-28 NOTE — Progress Notes (Signed)
Patient arrived into the unit at 0035,. Alert and oriented x 4, skin assessment performed with 2nd RN. ileostomy to RLQ. Bowel sound active in all quaadrants, respiratory even and unlabored, Vital signs stable on arrival.  Patient was oriented to room and educated on the poc. No acute distress noted.

## 2019-07-28 NOTE — Progress Notes (Addendum)
PROGRESS NOTE  Julie Arias [id number] DOB: [birth year] DOA: 07/25/2019 PCP: Mayra Neer, MD   LOS: 3 days   Brief narrative:  HPI: Julie Arias is a 68 y.o. female with medical history significant of partial colectomy with ileostomy due to diverticulitis, hypothyroidism, anxiety and GERD who presented with concerns of nausea, vomiting and dehydration. About a week and a half ago patient began to have symptoms of generalized body ache and subsequent nausea and vomiting with decrease PO intake. Noticed more stool output to ostomy bag.  Tested positive for COVID outpatient about a week ago. No shortness of breath or chest pain. No abdominal pain.  ED Course: She was afebrile and normotensive. Had desaturation on room air down to 88% and was placed on 2L with O2 of 94%. WBC of 11.2K, Hemoglobin of 17.3, platelet of 438. Sodium of 126, potassium 5.2, glucose of 131, creatinine of 1.75 from a prior of 0.63, AST of 43, ALT of 42, alkaline phosphatase of 201, Lipase 121,  anion gap of 16. Lactate of 3. CXR showed atelectasis.   Assessment/Plan:  Active Problems:   Hypothyroidism   Dehydration   Gastroenteritis due to COVID-19 virus   Acute respiratory failure with hypoxia (HCC)   Hyponatremia   Hyperkalemia   AKI (acute kidney injury) (HCC)   Increased anion gap metabolic acidosis   Thrombocytosis ([place])   COVID-19 virus infection  Acute hypoxia due to COVID infection. Chest x-ray shows subsegmental atelectasis.  Continue IV Decadron,Remdesvir .  Plan to complete the 5-day course of remdesivir.  Unable to sit up at the infusion clinic over the weekend.  Patient was saturating 91% on room air on presentation.  Currently  on 2 L of nasal cannula.  Continue incentive spirometer.  Follow daily inflammatory markers.  COVID-19 Labs  Recent Labs    07/27/19 1130  DDIMER 1.70*  FERRITIN 201  LDH 211*  CRP 4.0*    Lab Results  Component Value Date   SARSCOV2NAA POSITIVE  (A) 07/25/2019   [place] NEGATIVE 05/02/2019   [place] NEGATIVE 04/03/2019   [place] NEGATIVE 02/04/2019    Nausea/vomiting secondary to gastroenteritis due to COVID infection  Improved. Will encourage oral fluids.  Off IV fluids.  Hyponatremia Hypovolemic due to decrease PO intake .  Improved at this time.  Sodium of 135.  Mild hyperkalemia K of 5.2 on presentation.  Trended up to 5.7.  With hydration her potassium has normalized to 4.2 lipase  Acute kidney injury Creatinine of 1.75 on presentation.  Creatinine has significantly improved.  Creatinine of 0.8 today.  Elevated anion gap From hypovolemia and AKI, improved.  Anion gap of 8 today.  Thrombocytosis Reactive, improved  Hypothyroidism Continue levothyroxine   Abnormal urinalysis with significant [place] cells and leukocytes.  Pending urine culture.  No urinary symptoms.  Blood culture negative in 2 days.  Hold off with antibiotic for now.  VTE Prophylaxis: Lovenox  Code Status:  Full   Family Communication:  None today.  Spoke with the patient in detail.  Disposition Plan: Home likely by tomorrow, patient will have completed 5-day course of remdesivir by tomorow.  We will continue to wean oxygen as able.  Unable to set up remdesivir at the infusion clinic over the weekend.. Continue remdesivir/ dexamethasone.     Consultants:  None  Procedures:   None  Antibiotics: Anti-infectives (From admission, onward)   Start     Dose/Rate Route Frequency Ordered Stop   07/26/19 2200  remdesivir 100 mg in  sodium chloride 0.9 % 100 mL IVPB     100 mg 200 mL/hr over 30 Minutes Intravenous Daily at bedtime 07/26/19 0240 07/30/19 2159   07/26/19 0015  remdesivir 200 mg in sodium chloride 0.9% 250 mL IVPB     200 mg 580 mL/hr over 30 Minutes Intravenous Once 07/26/19 0007 07/26/19 [id number]     Subjective: Today, patient continues to feel better.  Denies any nausea, vomiting or abdominal pain.  Denies any  dyspnea or shortness of breath.  Patient is still on 2 L of nasal cannula oxygen  Objective: Vitals:   07/28/19 0039 07/28/19 0547  BP: 115/63 (!) 105/59  Pulse: 67 64  Resp: 20 18  Temp: 98 F (36.7 C) 97.6 F (36.4 C)  SpO2: (!) 89% 100%   No intake or output data in the 24 hours ending 07/28/19 1058 Filed Weights   07/28/19 0417  Weight: 62.5 kg   Body mass index is 24.41 kg/m.   Physical Exam: General: Alert awake and oriented.  Not in obvious distress.  On 2 L of oxygen by nasal cannula. HENT: Normocephalic, pupils equally reacting to light and accommodation.  No scleral pallor or icterus noted. Oral mucosa is moist.  Chest:  Clear breath sounds.  Diminished breath sounds bilaterally. No crackles or wheezes.  CVS: S1 &S2 heard. No murmur.  Regular rate and rhythm. Abdomen: Soft, nontender, nondistended.  Bowel sounds are heard.  Ostomy bag in the right lower quadrant.   Extremities: No cyanosis, clubbing or edema.  Peripheral pulses are palpable. Psych: Alert, awake and oriented, normal mood CNS:  No cranial nerve deficits.  Power equal in all extremities.  No sensory deficits noted.  No cerebellar signs.   Skin: Warm and dry.  No rashes noted.  Data Review: I have personally reviewed the following laboratory data and studies,  CBC: Recent Labs  Lab 07/25/19 1702 07/26/19 0740 07/28/19 0500  WBC 11.2* 5.7 8.2  NEUTROABS  --   --  6.9  HGB 17.3* 14.4 13.0  HCT 50.2* 42.7 38.3  MCV 84.9 86.4 86.5  PLT 438* 324 [id number]   Basic Metabolic Panel: Recent Labs  Lab 07/25/19 1702 07/26/19 0740 07/26/19 1632 07/27/19 0637 07/28/19 0500  NA 126* 133* 133* 134* 135  K 5.2* 5.7* 4.3 4.8 4.2  CL 92* 103 103 102 105  CO2 18* 22 19* 22 22  GLUCOSE 131* 131* 83 120* 132*  BUN 78* 51* 31* 23 14  CREATININE 1.75* 1.13* 0.88 0.93 0.82  CALCIUM 9.6 8.9 8.6* 8.9 8.3*   Liver Function Tests: Recent Labs  Lab 07/25/19 1702 07/26/19 0900 07/27/19 0637 07/28/19 0500  AST  43* 28 24 31   ALT 42 32 27 33  ALKPHOS 201* 152* 120 118  BILITOT 1.0 0.2* 0.4 0.4  PROT 8.3* 7.1 6.1* 5.7*  ALBUMIN 3.9 3.1* 2.8* 2.8*   Recent Labs  Lab 07/25/19 1702 07/27/19 0637  LIPASE 121* 129*   No results for input(s): AMMONIA in the last 168 hours. Cardiac Enzymes: No results for input(s): CKTOTAL, CKMB, CKMBINDEX, TROPONINI in the last 168 hours. BNP (last 3 results) No results for input(s): BNP in the last 8760 hours.  ProBNP (last 3 results) No results for input(s): PROBNP in the last 8760 hours.  CBG: No results for input(s): GLUCAP in the last 168 hours. Recent Results (from the past 240 hour(s))  Culture, blood (routine x 2)     Status: None (Preliminary result)   Collection Time: 07/25/19  11:10 PM   Specimen: BLOOD LEFT ARM  Result Value Ref Range Status   Specimen Description BLOOD LEFT ARM  Final   Special Requests   Final    BOTTLES DRAWN AEROBIC AND ANAEROBIC Blood Culture results may not be optimal due to an inadequate volume of blood received in culture bottles   Culture   Final    NO GROWTH 2 DAYS Performed at [facility], [place] [address] [place], [place] [address]    Report Status PENDING  Incomplete  SARS CORONAVIRUS 2 (TAT 6-24 HRS) Nasopharyngeal Nasopharyngeal Swab     Status: Abnormal   Collection Time: 07/25/19 11:38 PM   Specimen: Nasopharyngeal Swab  Result Value Ref Range Status   SARS Coronavirus 2 POSITIVE (A) NEGATIVE Final    Comment: RESULT CALLED TO, READ BACK BY AND VERIFIED WITH: A.CAIN,RN [id number] 07/26/19 G.MCADOO (NOTE) SARS-CoV-2 target nucleic acids are DETECTED. The SARS-CoV-2 RNA is generally detectable in upper and lower respiratory specimens during the acute phase of infection. Positive results are indicative of the presence of SARS-CoV-2 RNA. Clinical correlation with patient history and other diagnostic information is  necessary to determine patient infection status. Positive results do not rule out bacterial  infection or co-infection with other viruses.  The expected result is Negative. Fact Sheet for Patients: SugarRoll.be Fact Sheet for Healthcare Providers: https://www.woods-mathews.com/ This test is not yet approved or cleared by the [place] FDA and  has been authorized for detection and/or diagnosis of SARS-CoV-2 by FDA under an Emergency Use Authorization (EUA). This EUA will remain  in effect (meaning this test can be used) for the du ration of the COVID-19 declaration under Section 564(b)(1) of the Act, 21 U.S.C. section 360bbb-3(b)(1), unless the authorization is terminated or revoked sooner. Performed at [facility], [place] [address] [place], [place] [address]      Studies: No results found.  Scheduled Meds: . cyclobenzaprine  5 mg Oral TID  . dexamethasone (DECADRON) injection  6 mg Intravenous Q24H  . enoxaparin (LOVENOX) injection  40 mg Subcutaneous Q24H  . levothyroxine  175 mcg Oral Q0600  . pantoprazole  40 mg Oral Daily    Continuous Infusions: . remdesivir 100 mg in NS 100 mL Stopped (07/27/19 2237)     Flora Lipps, MD  [facility] 07/28/2019

## 2019-07-28 NOTE — Progress Notes (Signed)
Patient connected to cardiac monitor and continous pulse ox al vital signs stable will continue to monitor.

## 2019-07-29 DIAGNOSIS — E871 Hypo-osmolality and hyponatremia: Secondary | ICD-10-CM | POA: Diagnosis not present

## 2019-07-29 DIAGNOSIS — J9601 Acute respiratory failure with hypoxia: Secondary | ICD-10-CM | POA: Diagnosis not present

## 2019-07-29 DIAGNOSIS — E875 Hyperkalemia: Secondary | ICD-10-CM | POA: Diagnosis not present

## 2019-07-29 DIAGNOSIS — A0839 Other viral enteritis: Secondary | ICD-10-CM | POA: Diagnosis not present

## 2019-07-29 DIAGNOSIS — U071 COVID-19: Secondary | ICD-10-CM | POA: Diagnosis not present

## 2019-07-29 DIAGNOSIS — N179 Acute kidney failure, unspecified: Secondary | ICD-10-CM | POA: Diagnosis not present

## 2019-07-29 DIAGNOSIS — R112 Nausea with vomiting, unspecified: Secondary | ICD-10-CM | POA: Diagnosis not present

## 2019-07-29 DIAGNOSIS — E860 Dehydration: Secondary | ICD-10-CM | POA: Diagnosis not present

## 2019-07-29 DIAGNOSIS — E039 Hypothyroidism, unspecified: Secondary | ICD-10-CM | POA: Diagnosis not present

## 2019-07-29 LAB — CBC WITH DIFFERENTIAL/PLATELET
Abs Immature Granulocytes: 0.15 10*3/uL — ABNORMAL HIGH (ref 0.00–0.07)
Basophils Absolute: 0 10*3/uL (ref 0.0–0.1)
Basophils Relative: 0 %
Eosinophils Absolute: 0 10*3/uL (ref 0.0–0.5)
Eosinophils Relative: 0 %
HCT: 40 % (ref 36.0–46.0)
Hemoglobin: 13.5 g/dL (ref 12.0–15.0)
Immature Granulocytes: 2 %
Lymphocytes Relative: 11 %
Lymphs Abs: 0.8 10*3/uL (ref 0.7–4.0)
MCH: 29 pg (ref 26.0–34.0)
MCHC: 33.8 g/dL (ref 30.0–36.0)
MCV: 85.8 fL (ref 80.0–100.0)
Monocytes Absolute: 0.4 10*3/uL (ref 0.1–1.0)
Monocytes Relative: 5 %
Neutro Abs: 6.1 10*3/uL (ref 1.7–7.7)
Neutrophils Relative %: 82 %
Platelets: 292 10*3/uL (ref 150–400)
RBC: 4.66 MIL/uL (ref 3.87–5.11)
RDW: 13 % (ref 11.5–15.5)
WBC: 7.5 10*3/uL (ref 4.0–10.5)
nRBC: 0 % (ref 0.0–0.2)

## 2019-07-29 LAB — COMPREHENSIVE METABOLIC PANEL
ALT: 46 U/L — ABNORMAL HIGH (ref 0–44)
AST: 29 U/L (ref 15–41)
Albumin: 2.9 g/dL — ABNORMAL LOW (ref 3.5–5.0)
Alkaline Phosphatase: 124 U/L (ref 38–126)
Anion gap: 7 (ref 5–15)
BUN: 13 mg/dL (ref 8–23)
CO2: 22 mmol/L (ref 22–32)
Calcium: 8.5 mg/dL — ABNORMAL LOW (ref 8.9–10.3)
Chloride: 106 mmol/L (ref 98–111)
Creatinine, Ser: 0.78 mg/dL (ref 0.44–1.00)
GFR calc Af Amer: >60 mL/min (ref >60)
GFR calc non Af Amer: >60 mL/min (ref >60)
Glucose, Bld: 119 mg/dL — ABNORMAL HIGH (ref 70–99)
Potassium: 4.1 mmol/L (ref 3.5–5.1)
Sodium: 135 mmol/L (ref 135–145)
Total Bilirubin: <0.1 mg/dL — ABNORMAL LOW (ref 0.3–1.2)
Total Protein: 5.6 g/dL — ABNORMAL LOW (ref 6.5–8.1)

## 2019-07-29 LAB — D-DIMER, QUANTITATIVE: D-Dimer, Quant: 1.11 ug/mL-FEU — ABNORMAL HIGH (ref 0.00–0.50)

## 2019-07-29 MED ORDER — DEXAMETHASONE 6 MG PO TABS: 6.0000 mg | ORAL_TABLET | Freq: Every day | ORAL | 0 refills | Status: AC

## 2019-07-29 NOTE — Discharge Summary (Signed)
Physician Discharge Summary  Julie Arias [id number] DOB: [birth year] DOA: 07/25/2019  PCP: Mayra Neer, MD  Admit date: 07/25/2019 Discharge date: 07/29/2019  Time spent: 55 minutes  Recommendations for Outpatient Follow-up:  1. Follow-up with Mayra Neer, MD in 2 weeks.   Discharge Diagnoses:  Principal Problem:   Acute respiratory failure with hypoxia (HCC) Active Problems:   Gastroenteritis due to COVID-19 virus   Hypothyroidism   Dehydration   Hyponatremia   Hyperkalemia   AKI (acute kidney injury) (HCC)   Increased anion gap metabolic acidosis   Thrombocytosis ([place])   COVID-19 virus infection   Discharge Condition: Stable and improved  Diet recommendation: Regular  Filed Weights   07/28/19 0417  Weight: 62.5 kg    History of present illness:  HPI per Dr. Vickii Chafe is a 68 y.o. female with medical history significant of Partial colectomy with ileostomy due to diverticulitis, hypothyroidism, anxiety and GERD who presented with concerns of nausea, vomiting and dehydration. About a week and a half ago patient began to have symptoms of generalized body ache and subsequent nausea and vomiting with decrease PO intake. Noticed more stool output to ostomy bag.  Tested positive for COVID outpatient about a week ago. No shortness of breath or chest pain. No abdominal pain.  ED Course: She was afebrile and normotensive. Had desaturation on room air down to 88% and was placed on 2L with O2 of 94%.  WBC of 11.2K, Hemoglobin of 17.3, platelet of 438  Sodium of 126, potassium 5.2, glucose of 131, creatinine of 1.75 from a prior of 0.63, AST of 43, ALT of 42, alkaline phosphatase of 201, Lipase 121,  anion gap of 16. Lactate of 3. CXR negative.  Hospital Course:  1 acute respiratory failure with hypoxia secondary to COVID-19 infection Patient had presented with generalized body aches, subsequent nausea vomiting and decreased oral intake and increased  stool output from ostomy bags.  Patient noted to have tested positive for Covid 19 infection about a week prior to admission.  Patient denied any shortness of breath or chest pain.  Chest x-ray which was done showed subsegmental atelectasis.  On presentation patient noted to be hypoxic with sats down to 88% on room air.  Patient placed empirically on IV Decadron and IV remdesivir.  Patient completed 5-day course of IV remdesivir during the hospitalization.  Patient improved clinically.  Patient's inflammatory markers improved during the hospitalization.  Patient's hypoxia improved such that by day of discharge patient sats were up to 96% on room air.  Patient will be discharged home on 4 more days of oral Decadron to complete a 10-day course of treatment.  Outpatient follow-up with PCP.  2.  Nausea/Vomiting secondary to gastroenteritis secondary to COVID-19 infection Patient was placed on Decadron and IV remdesivir.  Patient improved clinically.  Patient had no further nausea or vomiting and was tolerating oral intake by day of discharge.  See problem #1.  3.  Hyponatremia Felt secondary to a hypovolemic hyponatremia which resolved with hydration.  4.  Acute kidney injury Felt secondary to prerenal azotemia secondary to volume depletion.  Patient was hydrated with IV fluids and acute kidney injury had resolved by day of discharge.  6.  Hypothyroidism Patient was maintained on home regimen Synthroid.  7.  Abnormal urinalysis Patient was noted to have abnormal urinalysis with significant Mccauslin cells and leukocytosis.  Blood cultures which were drawn were negative.  Patient remained asymptomatic.  Urine cultures obtained grew out 40,000 colonies  of Citrobacter koseri and 20,000 colonies of Enterococcus faecalis.  Patient remained afebrile.  Patient remained asymptomatic and antibiotics were not needed.  Outpatient follow-up.  8.  Mild hyperkalemia Resolved with  hydration.  Procedures:  None  Consultations:  None  Discharge Exam: Vitals:   07/28/19 2231 07/29/19 0551  BP: (!) 109/58 102/69  Pulse: 84 68  Resp: 20 14  Temp: 98.4 F (36.9 C) (!) 97.4 F (36.3 C)  SpO2: 96% 96%    General: NAD Cardiovascular: RRR Respiratory: CTAB  Discharge Instructions   Discharge Instructions    Diet general   Complete by: As directed    Discharge instructions   Complete by: As directed    ?   Person Under Monitoring Name: Julie Arias  Location: [place] [address]   Infection Prevention Recommendations for Individuals Confirmed to have, or Being Evaluated for, 2019 Novel Coronavirus (COVID-19) Infection Who Receive Care at Home  Individuals who are confirmed to have, or are being evaluated for, COVID-19 should follow the prevention steps below until a healthcare provider or local or state health department says they can return to normal activities.  Stay home except to get medical care You should restrict activities outside your home, except for getting medical care. Do not go to work, school, or public areas, and do not use public transportation or taxis.  Call ahead before visiting your doctor Before your medical appointment, call the healthcare provider and tell them that you have, or are being evaluated for, COVID-19 infection. This will help the healthcare provider's office take steps to keep other people from getting infected. Ask your healthcare provider to call the local or state health department.  Monitor your symptoms Seek prompt medical attention if your illness is worsening (e.g., difficulty breathing). Before going to your medical appointment, call the healthcare provider and tell them that you have, or are being evaluated for, COVID-19 infection. Ask your healthcare provider to call the local or state health department.  Wear a facemask You should wear a facemask that covers your nose  and mouth when you are in the same room with other people and when you visit a healthcare provider. People who live with or visit you should also wear a facemask while they are in the same room with you.  Separate yourself from other people in your home As much as possible, you should stay in a different room from other people in your home. Also, you should use a separate bathroom, if available.  Avoid sharing household items You should not share dishes, drinking glasses, cups, eating utensils, towels, bedding, or other items with other people in your home. After using these items, you should wash them thoroughly with soap and water.  Cover your coughs and sneezes Cover your mouth and nose with a tissue when you cough or sneeze, or you can cough or sneeze into your sleeve. Throw used tissues in a lined trash can, and immediately wash your hands with soap and water for at least 20 seconds or use an alcohol-based hand rub.  Wash your [facility] your hands often and thoroughly with soap and water for at least 20 seconds. You can use an alcohol-based hand sanitizer if soap and water are not available and if your hands are not visibly dirty. Avoid touching your eyes, nose, and mouth with unwashed hands.   Prevention Steps for Caregivers and Household Members of Individuals Confirmed to have, or Being Evaluated for, COVID-19 Infection Being  Cared for in the Home  If you live with, or provide care at home for, a person confirmed to have, or being evaluated for, COVID-19 infection please follow these guidelines to prevent infection:  Follow healthcare provider's instructions Make sure that you understand and can help the patient follow any healthcare provider instructions for all care.  Provide for the patient's basic needs You should help the patient with basic needs in the home and provide support for getting groceries, prescriptions, and other personal needs.  Monitor the patient's  symptoms If they are getting sicker, call his or her medical provider and tell them that the patient has, or is being evaluated for, COVID-19 infection. This will help the healthcare provider's office take steps to keep other people from getting infected. Ask the healthcare provider to call the local or state health department.  Limit the number of people who have contact with the patient If possible, have only one caregiver for the patient. Other household members should stay in another home or place of residence. If this is not possible, they should stay in another room, or be separated from the patient as much as possible. Use a separate bathroom, if available. Restrict visitors who do not have an essential need to be in the home.  Keep older adults, very young children, and other sick people away from the patient Keep older adults, very young children, and those who have compromised immune systems or chronic health conditions away from the patient. This includes people with chronic heart, lung, or kidney conditions, diabetes, and cancer.  Ensure good ventilation Make sure that shared spaces in the home have good air flow, such as from an air conditioner or an opened window, weather permitting.  Wash your hands often Wash your hands often and thoroughly with soap and water for at least 20 seconds. You can use an alcohol based hand sanitizer if soap and water are not available and if your hands are not visibly dirty. Avoid touching your eyes, nose, and mouth with unwashed hands. Use disposable paper towels to dry your hands. If not available, use dedicated cloth towels and replace them when they become wet.  Wear a facemask and gloves Wear a disposable facemask at all times in the room and gloves when you touch or have contact with the patient's blood, body fluids, and/or secretions or excretions, such as sweat, saliva, sputum, nasal mucus, vomit, urine, or feces.  Ensure the mask fits over  your nose and mouth tightly, and do not touch it during use. Throw out disposable facemasks and gloves after using them. Do not reuse. Wash your hands immediately after removing your facemask and gloves. If your personal clothing becomes contaminated, carefully remove clothing and launder. Wash your hands after handling contaminated clothing. Place all used disposable facemasks, gloves, and other waste in a lined container before disposing them with other household waste. Remove gloves and wash your hands immediately after handling these items.  Do not share dishes, glasses, or other household items with the patient Avoid sharing household items. You should not share dishes, drinking glasses, cups, eating utensils, towels, bedding, or other items with a patient who is confirmed to have, or being evaluated for, COVID-19 infection. After the person uses these items, you should wash them thoroughly with soap and water.  Wash laundry thoroughly Immediately remove and wash clothes or bedding that have blood, body fluids, and/or secretions or excretions, such as sweat, saliva, sputum, nasal mucus, vomit, urine, or feces, on them.  Wear gloves when handling laundry from the patient. Read and follow directions on labels of laundry or clothing items and detergent. In general, wash and dry with the warmest temperatures recommended on the label.  Clean all areas the individual has used often Clean all touchable surfaces, such as counters, tabletops, doorknobs, bathroom fixtures, toilets, phones, keyboards, tablets, and bedside tables, every day. Also, clean any surfaces that may have blood, body fluids, and/or secretions or excretions on them. Wear gloves when cleaning surfaces the patient has come in contact with. Use a diluted bleach solution (e.g., dilute bleach with 1 part bleach and 10 parts water) or a household disinfectant with a label that says EPA-registered for coronaviruses. To make a bleach  solution at home, add 1 tablespoon of bleach to 1 quart (4 cups) of water. For a larger supply, add  cup of bleach to 1 gallon (16 cups) of water. Read labels of cleaning products and follow recommendations provided on product labels. Labels contain instructions for safe and effective use of the cleaning product including precautions you should take when applying the product, such as wearing gloves or eye protection and making sure you have good ventilation during use of the product. Remove gloves and wash hands immediately after cleaning.  Monitor yourself for signs and symptoms of illness Caregivers and household members are considered close contacts, should monitor their health, and will be asked to limit movement outside of the home to the extent possible. Follow the monitoring steps for close contacts listed on the symptom monitoring form.   ? If you have additional questions, contact your local health department or call the epidemiologist on call at [phone] (available 24/7). ? This guidance is subject to change. For the most up-to-date guidance from [facility], please refer to their website: YouBlogs.pl   Increase activity slowly   Complete by: As directed      Allergies as of 07/29/2019      Reactions   Aleve [naproxen Sodium] Rash      Medication List    TAKE these medications   cyclobenzaprine 5 MG tablet Commonly known as: FLEXERIL Take 1 tablet (5 mg total) by mouth 3 (three) times daily.   dexamethasone 6 MG tablet Commonly known as: DECADRON Take 1 tablet (6 mg total) by mouth daily for 4 days. .. Start taking on: July 30, 2019   Euthyrox 175 MCG tablet Generic drug: levothyroxine Take 175 mcg by mouth daily before breakfast.   ibuprofen 200 MG tablet Commonly known as: ADVIL Take 400 mg by mouth 2 (two) times daily as needed for headache.   loperamide 2 MG capsule Commonly known as:  IMODIUM Take 1 capsule (2 mg total) by mouth 2 (two) times daily. What changed:   when to take this  reasons to take this   LORazepam 0.5 MG tablet Commonly known as: ATIVAN Take 0.5 mg by mouth at bedtime as needed for anxiety or sleep.   omeprazole 40 MG capsule Commonly known as: PRILOSEC Take 40 mg by mouth daily.   ondansetron 4 MG tablet Commonly known as: Zofran Take 1 tablet (4 mg total) by mouth every 8 (eight) hours as needed for nausea or vomiting.   traMADol 50 MG tablet Commonly known as: ULTRAM Take 50 mg by mouth every 6 (six) hours as needed for moderate pain.      Allergies  Allergen Reactions  . Aleve [Naproxen Sodium] Rash      The results of significant diagnostics from this hospitalization (including imaging,  microbiology, ancillary and laboratory) are listed below for reference.    Significant Diagnostic Studies: DG Chest Port 1 View  Result Date: 07/25/2019 CLINICAL DATA:  Vomiting and 52 EXAM: PORTABLE CHEST 1 VIEW COMPARISON:  None. FINDINGS: The heart size and mediastinal contours are within normal limits. Aortic knob calcifications. Minimally increased interstitial markings seen at both lung bases, likely atelectasis. Aortic knob calcifications. No acute osseous abnormality. IMPRESSION: Subsegmental atelectasis of both lung bases. Electronically Signed   By: Prudencio Pair M.D.   On: 07/25/2019 23:40    Microbiology: Recent Results (from the past 240 hour(s))  Culture, blood (routine x 2)     Status: None (Preliminary result)   Collection Time: 07/25/19 11:10 PM   Specimen: BLOOD LEFT ARM  Result Value Ref Range Status   Specimen Description BLOOD LEFT ARM  Final   Special Requests   Final    BOTTLES DRAWN AEROBIC AND ANAEROBIC Blood Culture results may not be optimal due to an inadequate volume of blood received in culture bottles   Culture   Final    NO GROWTH 3 DAYS Performed at [facility], [place] [address] [place], [place]  [address]    Report Status PENDING  Incomplete  SARS CORONAVIRUS 2 (TAT 6-24 HRS) Nasopharyngeal Nasopharyngeal Swab     Status: Abnormal   Collection Time: 07/25/19 11:38 PM   Specimen: Nasopharyngeal Swab  Result Value Ref Range Status   SARS Coronavirus 2 POSITIVE (A) NEGATIVE Final    Comment: RESULT CALLED TO, READ BACK BY AND VERIFIED WITH: A.CAIN,RN [id number] 07/26/19 G.MCADOO (NOTE) SARS-CoV-2 target nucleic acids are DETECTED. The SARS-CoV-2 RNA is generally detectable in upper and lower respiratory specimens during the acute phase of infection. Positive results are indicative of the presence of SARS-CoV-2 RNA. Clinical correlation with patient history and other diagnostic information is  necessary to determine patient infection status. Positive results do not rule out bacterial infection or co-infection with other viruses.  The expected result is Negative. Fact Sheet for Patients: SugarRoll.be Fact Sheet for Healthcare Providers: https://www.woods-mathews.com/ This test is not yet approved or cleared by the [place] FDA and  has been authorized for detection and/or diagnosis of SARS-CoV-2 by FDA under an Emergency Use Authorization (EUA). This EUA will remain  in effect (meaning this test can be used) for the du ration of the COVID-19 declaration under Section 564(b)(1) of the Act, 21 U.S.C. section 360bbb-3(b)(1), unless the authorization is terminated or revoked sooner. Performed at [facility], [place] [address] [place], [place] [address]   Culture, Urine     Status: Abnormal (Preliminary result)   Collection Time: 07/26/19  6:50 AM   Specimen: Urine, Random  Result Value Ref Range Status   Specimen Description URINE, RANDOM  Final   Special Requests NONE  Final   Culture (A)  Final    40,000 COLONIES/mL CITROBACTER KOSERI 20,000 COLONIES/mL ENTEROCOCCUS FAECALIS    Report Status PENDING  Incomplete   Organism ID,  Bacteria CITROBACTER KOSERI (A)  Final      Susceptibility   Citrobacter koseri - MIC*    CEFAZOLIN <=4 SENSITIVE Sensitive     CEFTRIAXONE <=1 SENSITIVE Sensitive     CIPROFLOXACIN <=0.25 SENSITIVE Sensitive     GENTAMICIN <=1 SENSITIVE Sensitive     IMIPENEM <=0.25 SENSITIVE Sensitive     NITROFURANTOIN 64 INTERMEDIATE Intermediate     TRIMETH/SULFA <=20 SENSITIVE Sensitive     PIP/TAZO Value in next row Sensitive      <=  4 SENSITIVEPerformed at [facility], [place] [address], [place], [place] [address]    * 40,000 COLONIES/mL CITROBACTER KOSERI     Labs: Basic Metabolic Panel: Recent Labs  Lab 07/26/19 0740 07/26/19 1632 07/27/19 0637 07/28/19 0500 07/29/19 0500  NA 133* 133* 134* 135 135  K 5.7* 4.3 4.8 4.2 4.1  CL 103 103 102 105 106  CO2 22 19* 22 22 22   GLUCOSE 131* 83 120* 132* 119*  BUN 51* 31* 23 14 13   CREATININE 1.13* 0.88 0.93 0.82 0.78  CALCIUM 8.9 8.6* 8.9 8.3* 8.5*   Liver Function Tests: Recent Labs  Lab 07/25/19 1702 07/26/19 0900 07/27/19 0637 07/28/19 0500 07/29/19 0500  AST 43* 28 24 31 29   ALT 42 32 27 33 46*  ALKPHOS 201* 152* 120 118 124  BILITOT 1.0 0.2* 0.4 0.4 <0.1*  PROT 8.3* 7.1 6.1* 5.7* 5.6*  ALBUMIN 3.9 3.1* 2.8* 2.8* 2.9*   Recent Labs  Lab 07/25/19 1702 07/27/19 0637  LIPASE 121* 129*   No results for input(s): AMMONIA in the last 168 hours. CBC: Recent Labs  Lab 07/25/19 1702 07/26/19 0740 07/28/19 0500 07/29/19 0500  WBC 11.2* 5.7 8.2 7.5  NEUTROABS  --   --  6.9 6.1  HGB 17.3* 14.4 13.0 13.5  HCT 50.2* 42.7 38.3 40.0  MCV 84.9 86.4 86.5 85.8  PLT 438* 324 283 292   Cardiac Enzymes: No results for input(s): CKTOTAL, CKMB, CKMBINDEX, TROPONINI in the last 168 hours. BNP: BNP (last 3 results) No results for input(s): BNP in the last 8760 hours.  ProBNP (last 3 results) No results for input(s): PROBNP in the last 8760 hours.  CBG: No results for input(s): GLUCAP in the last 168  hours.     Signed:  Irine Seal MD.  [facility] 07/29/2019, 2:11 PM

## 2019-07-29 NOTE — Progress Notes (Signed)
Julie Arias to be D/C'd Home per MD order.  Discussed with the patient and all questions fully answered.  VSS, Skin clean, dry and intact without evidence of skin break down, no evidence of skin tears noted. IV catheter discontinued intact. Site without signs and symptoms of complications. There was some bruising in the area. Dressing and pressure applied.  An After Visit Summary was printed and given to the patient. Patient received prescription.  D/c education completed with patient including follow up instructions, medication list, d/c activities limitations if indicated, with other d/c instructions as indicated by MD - patient able to verbalize understanding, all questions fully answered.   Patient instructed to return to ED, call 911, or call MD for any changes in condition.   Patient escorted via [place], and D/C home via private auto.  [name] 07/29/2019 2:17 PM

## 2019-07-30 ENCOUNTER — Other Ambulatory Visit: Payer: Self-pay | Admitting: *Deleted

## 2019-07-30 ENCOUNTER — Encounter: Payer: Self-pay | Admitting: *Deleted

## 2019-07-30 LAB — URINE CULTURE: Culture: 40000 — AB

## 2019-07-30 NOTE — Patient Outreach (Signed)
[name] Care Management [place] Telephone Outreach PCP completes transition of care follow up post-hospital discharge Post- (most recent) hospital discharge day # 1  07/30/2019  Julie Arias November 15, 1950 [id number]  Successful telephone outreach toRosemarie "Summer Roccia, referred to [facility] CM after EMMI red- alert received for general discharge; patient was subsequently engaged in [facility] CM services after recenthospitalization September 30- May 10, 2019 for diverticulitis of large intestine/ colon with hemorrhage; patient had surgical colectomy with new ostomy placement. Patient was discharged home to self-care without home health services in place. Patient has history including, but not limited to, GERD; diverticulitis; hypothyroidism; and anxiety.  Unfortunately, patient experienced hospital re-admission December 23-27, 2020 for positive corona virus with complications.  Patient was discharged home to self-care without home health services in place.  HIPAA/ identity verified with patient during phone call today.  Patient reports "doing okay" but "very weak."  Denies pain/ new recent falls.  Discussed recent hospitalization with patient and confirmed that she has read/ understands hospital discharge instructions and is waiting to hear from her outpatient pharmacy to obtain prescribed decadron; declines need for assistance stating she expects to hear form pharmacy soon.  Patient denies pain/ new/ recent falls, clinical concerns, and states that she "feels so much better" despite ongoing weakness.  She sounds to be in no distress throughout phone call today.  Patient further reports: --has contactedPCP to schedule post-hospital discharge appointment; waiting to hear back from office staff --plans to hold off with plans to surgically reverse ostomy until she is "fully healed from corona virus;" positive reinforcement provided- discussed need to allow herself  to fully recover and she verbalizes understanding/ agreement -- understands corona virus precautions to take; able to verbalize accurate report of specific recommendations post-hospital discharge -- no problems/ issues/ concerns around ostomy; states, "right now, that is the least of my problems." -- discussed need to stay hydrated and promptly contact care providers for any new concerns that arise- patient verbalized understanding and agreement; made patient aware that she may also be contacted by the health department and encouraged her to engage with health department team if she is contacted  Patient denies further issues, concerns, or problems today. Iconfirmed that patient hasmy direct phone number, the main [facility]N CM office phone number, and the [facility] CM 24-hour nurse advice phone number should issues arise prior to next scheduled [facility] Community CM outreachnext week.  Reiterated role of [facility]N RN CM with patient and encouraged patient to contact me directly if needs, questions, issues, or concerns arise prior to next scheduled outreach; patient agreed to do so.  Plan:  Patient will take medications as prescribed and will attend all scheduled provider appointments  Patient will promptly notify care providers for any new concerns/ issues/ problems that arise  [facility] Community CM outreach to continue with scheduled phone next week  [facility] CM Care Plan Problem One     Most Recent Value  Care Plan Problem One  High risk for hospital readmission related to/ as evidenced by recent hospitalization December 23-27, 2020 for positive corona virus with complications  Role Documenting the Problem One  Care Management [place] for Problem One  Active  THN Long Term Goal   Over the next 31 days, patient will not experience hospital re-admission, as evidenced by patient reporting and review of EMR during [facility] RN CM outreach  [facility] Long Term Goal Start Date  07/30/19  Interventions for Problem One  Long Term Goal  Discussed with patient her recent hospitalization and current clinical condition post- hospital discharge,  confirmed that patient has no current clinical concerns and reviewed post-hospital discharge instructions with her,  confirmed that patient understands discharge instructions and understands importance of taking medication as prescribed post-discharge,  confirmed that patient understands and is following all recommended precautions for positive corona virus diagnosis  THN CM Short Term Goal #1   Over the next 21 days, patient will scheduled and attend post-hospital discharge office visit with PCP as evidenced by patient reporting and review of EMR during [facility] RN CM outreach  [facility] CM Short Term Goal #1 Start Date  07/30/19  Interventions for Short Term Goal #1  Confirmed that patient has contacted her PCP to schedule post-hospital discharge appointment and is waiting to hear back from office staff to schedule,  discussed role of [facility] RN CM and reiterated with patient that I can assist with scheduling if necessary    [facility] CM Care Plan Problem Two     Most Recent Value  Care Plan Problem Two  Self- health management of chronic disease state of diverticulitis, as evidenced by patient reporting  Role Documenting the Problem Two  Care Management [place] for Problem Two  Active  Interventions for Problem Two Long Term Goal   Confirmed that patient has plans to contact surgeon to re-schedule next week's scheduled office visit to discuss ostomy reversal,  positive reinforcement provided,  discussed with patient need to remain conservative with activities/ work resumption, etc and allow herself to fully heal  THN Long Term Goal  Over the next 60 days, patient will verbalize plan for upcoming surgical revision of ileostomy, as evidenced by patient reporting of same  [Goal re-established today due to recent hospitalization]  THN Long Term Goal Start Date  07/30/19 [Goal  re-established today]     [name], RN, BSN, [place] Coordinator [facility] Care Management  [phone]

## 2019-07-31 LAB — CULTURE, BLOOD (ROUTINE X 2)
Culture: NO GROWTH
Culture: NO GROWTH

## 2019-08-01 ENCOUNTER — Ambulatory Visit: Payer: Self-pay | Admitting: *Deleted

## 2019-08-04 DIAGNOSIS — R112 Nausea with vomiting, unspecified: Secondary | ICD-10-CM

## 2019-08-06 ENCOUNTER — Other Ambulatory Visit: Payer: Self-pay | Admitting: *Deleted

## 2019-08-06 ENCOUNTER — Encounter: Payer: Self-pay | Admitting: *Deleted

## 2019-08-06 NOTE — Patient Outreach (Signed)
[place] [facility]) [place] Telephone Outreach PCP office completes Transition of Care follow up post-hospital discharge Post-hospital discharge day # 8  08/06/2019  Julie Arias [birth year] [id number]  Successful telephone outreach toRosemarie "Jackalyn Arias, referred to [facility] CM after EMMI red- alert received for general discharge; patient was subsequently engaged in [place] services after recenthospitalization September 30- May 10, 2019 for diverticulitis of large intestine/ colon with hemorrhage; patient had surgical colectomy with new ostomy placement. Patient was discharged home to self-care without home health services in place. Patient has history including, but not limited to, GERD; diverticulitis; hypothyroidism; and anxiety.  Unfortunately, patient experienced hospital re-admission December 23-27, 2020 for positive corona virus with complications.  Patient was discharged home to self-care without home health services in place.  HIPAA/ identity verified with patient during phone call today.  Patient reports "doing much better" and she denies ongoing concerns/ issues/ concerns/ residual clinical symptoms due to recent corona virus diagnosis; states she "feels about back to  Normal; just a little weak."  Patient confirms that she heard form the [facility] Department and was released from quarantine "last Thursday."  Reports there will be no further follow up from Health Department that she is aware of.  Patient denies pain and new/ recent falls and sounds to be in no distress throughout phone call today  Patient further reports: -- obtained and took post-hospital discharge medications (decadron) as prescribed; course is now completed --has contactedPCP to schedule post-hospital discharge appointment; plans to attend scheduled appointment 08/08/2019- reports to drive self to appointment; discussed with patient talking points to cover  during this visit around returning to work and indication/ recommendations around obtaining corona virus vaccine -- has started back to work part time: cautioned patient to continue practices for prevention of further corona virus spread; verbalizes understanding/ agreement.  Encouraged patient to not over-do and pace activities to incorporate adequate rest into every day in an effort to prevent post-corona virus complications --plans to hold off with plans to surgically reverse ostomy until she is "fully healed from corona virus; at least for another 30 days" positive reinforcement provided- discussed need to allow herself to fully recover and she verbalizes understanding/ agreement -- no problems/ issues/ concerns around ostomy; continues elf-managing ostomy at home and confirms that she has no concerns and has all needed supplies  Patient denies further issues, concerns, or problems today. Iconfirmed that patient hasmy direct phone number, the main THN CM office phone number, and the [facility] CM 24-hour nurse advice phone number should issues arise prior to next scheduled [facility]N Community CM outreachin 3 weeks, unless indicated sooner.  Reiterated role of THN RN CM with patient and encouraged patient to contact me directly if needs, questions, issues, or concerns arise prior to next scheduled outreach; patient agreed to do so.  Plan:  Patient will take medications as prescribed and will attend all scheduled provider appointments  Patient will promptly notify care providers for any new concerns/ issues/ problems that arise  [place] outreach to continue with scheduled phone in 3 weeks  [facility] CM Care Plan Problem One     Most Recent Value  Care Plan Problem One  High risk for hospital readmission related to/ as evidenced by recent hospitalization December 23-27, 2020 for positive corona virus with complications  Role Documenting the Problem One  Care Management [place]  for Problem One  Active  THN Long Term Goal   Over  the next 31 days, patient will not experience hospital re-admission, as evidenced by patient reporting and review of EMR during [facility] RN CM outreach  [place] Term Goal Start Date  07/30/19  Interventions for Problem One Long Term Goal  Discussed current clinical condition with patient and confirmed that she is feeling better,  confirmed that she has no ongoing residual effects of recent corona virus diagnosis,  confirmed that she has heard from Health department and was released from [place] on Thursday 08/02/19,  confirmed that patient obtained and completed course of steroids post-hospital discharge medication  THN CM Short Term Goal #1   Over the next 21 days, patient will scheduled and attend post-hospital discharge office visit with PCP as evidenced by patient reporting and review of EMR during [facility] RN CM outreach  [facility] CM Short Term Goal #1 Start Date  07/30/19  [facility] CM Short Term Goal #1 Met Date  08/06/19 [Goal Met]  Interventions for Short Term Goal #1  Confirmed that patient scheduled PCP office visit for Wednesday 08/08/2019 and has plans to attend,  coached patient on talking points around questions for PCP re: corona virus vaccine and returning to work post- corona virus diagnosis    [facility] CM Care Plan Problem Two     Most Recent Value  Care Plan Problem Two  Self- health management of chronic disease state of diverticulitis, as evidenced by patient reporting  Role Documenting the Problem Two  Care Management Coordinator  Care Plan for Problem Two  Active  Interventions for Problem Two Long Term Goal   Confirmed that patient plans to re-schedule this visit post- recent corona virus diagnosis,  confirmed that she is having no problems/ concerns with ostomy,  confirmed that she has all supplies at home and continues self-managing ostomy care  [facility] Long Term Goal  Over the next 60 days, patient will verbalize plan for upcoming surgical revision of  ileostomy, as evidenced by patient reporting of same   [place] Term Goal Start Date  07/30/19     [name], RN, BSN, [place] Care Management  [phone]

## 2019-08-08 ENCOUNTER — Encounter: Payer: Self-pay | Admitting: Surgery

## 2019-08-08 DIAGNOSIS — U071 COVID-19: Secondary | ICD-10-CM | POA: Diagnosis not present

## 2019-08-08 DIAGNOSIS — K529 Noninfective gastroenteritis and colitis, unspecified: Secondary | ICD-10-CM | POA: Diagnosis not present

## 2019-08-08 DIAGNOSIS — E039 Hypothyroidism, unspecified: Secondary | ICD-10-CM | POA: Diagnosis not present

## 2019-08-08 DIAGNOSIS — Z932 Ileostomy status: Secondary | ICD-10-CM | POA: Diagnosis not present

## 2019-08-08 DIAGNOSIS — B370 Candidal stomatitis: Secondary | ICD-10-CM | POA: Diagnosis not present

## 2019-08-08 DIAGNOSIS — N179 Acute kidney failure, unspecified: Secondary | ICD-10-CM | POA: Diagnosis not present

## 2019-08-22 ENCOUNTER — Other Ambulatory Visit: Payer: Self-pay

## 2019-08-22 ENCOUNTER — Ambulatory Visit (INDEPENDENT_AMBULATORY_CARE_PROVIDER_SITE_OTHER): Payer: Medicare HMO | Admitting: Surgery

## 2019-08-22 ENCOUNTER — Encounter: Payer: Self-pay | Admitting: Surgery

## 2019-08-22 VITALS — BP 144/84 | HR 87 | Temp 97.7°F | Resp 14 | Ht 63.0 in | Wt 137.2 lb

## 2019-08-22 DIAGNOSIS — K5732 Diverticulitis of large intestine without perforation or abscess without bleeding: Secondary | ICD-10-CM | POA: Diagnosis not present

## 2019-08-22 NOTE — Patient Instructions (Signed)
Please see your follow up appointment.   GENERAL POST-OPERATIVE PATIENT INSTRUCTIONS   WOUND CARE INSTRUCTIONS:  Keep a dry clean dressing on the wound if there is drainage. The initial bandage may be removed after 24 hours.  Once the wound has quit draining you may leave it open to air.  If clothing rubs against the wound or causes irritation and the wound is not draining you may cover it with a dry dressing during the daytime.  Try to keep the wound dry and avoid ointments on the wound unless directed to do so.  If the wound becomes bright red and painful or starts to drain infected material that is not clear, please contact your physician immediately.  If the wound is mildly pink and has a thick firm ridge underneath it, this is normal, and is referred to as a healing ridge.  This will resolve over the next 4-6 weeks.  BATHING: You may shower if you have been informed of this by your surgeon. However, Please do not submerge in a tub, hot tub, or pool until incisions are completely sealed or have been told by your surgeon that you may do so.  DIET:  You may eat any foods that you can tolerate.  It is a good idea to eat a high fiber diet and take in plenty of fluids to prevent constipation.  If you do become constipated you may want to take a mild laxative or take ducolax tablets on a daily basis until your bowel habits are regular.  Constipation can be very uncomfortable, along with straining, after recent surgery.  ACTIVITY:  You are encouraged to cough and deep breath or use your incentive spirometer if you were given one, every 15-30 minutes when awake.  This will help prevent respiratory complications and low grade fevers post-operatively if you had a general anesthetic.  You may want to hug a pillow when coughing and sneezing to add additional support to the surgical area, if you had abdominal or chest surgery, which will decrease pain during these times.  You are encouraged to walk and engage in  light activity for the next two weeks.  You should not lift more than 20 pounds 6 weeks after your surgery as it could put you at increased risk for complications.  Twenty pounds is roughly equivalent to a plastic bag of groceries. At that time- Listen to your body when lifting, if you have pain when lifting, stop and then try again in a few days. Soreness after doing exercises or activities of daily living is normal as you get back in to your normal routine.  MEDICATIONS:  Try to take narcotic medications and anti-inflammatory medications, such as tylenol, ibuprofen, naprosyn, etc., with food.  This will minimize stomach upset from the medication.  Should you develop nausea and vomiting from the pain medication, or develop a rash, please discontinue the medication and contact your physician.  You should not drive, make important decisions, or operate machinery when taking narcotic pain medication.  SUNBLOCK Use sun block to incision area over the next year if this area will be exposed to sun. This helps decrease scarring and will allow you avoid a permanent darkened area over your incision.  QUESTIONS:  Please feel free to call our office if you have any questions, and we will be glad to assist you.

## 2019-08-22 NOTE — Progress Notes (Signed)
Julie Arias is a 69 year old female well-known to me with a prior history of diverticulitis requiring low anterior resection and diverting loop ileostomy.  More recently she developed right neck hematoma that has resolved.  More importantly she did develop Covid requiring hospitalization.  She did have an acute kidney injury related to hypovolemia.  She is recovering well but is not at 100% yet.  She is able to eat, no nausea no vomiting abdominal pain ostomy is working well.  PE NAD Abd: soft, nt, ileostomy pink and patent.  No peritonitis.  A/p [name] is due for ileostomy reversal however given her recent hospitalization and Covid infection I would like to wait at least a month.  I will let her recover back to baseline before elective surgical intervention is indicated.  I discussed with her briefly about the perioperative course and above our rationale.  She is in agreement. Please note that I spent 15 minutes in this encounter w > 50% spent in coordination and counseling of her care

## 2019-08-29 ENCOUNTER — Encounter: Payer: Self-pay | Admitting: *Deleted

## 2019-08-29 ENCOUNTER — Other Ambulatory Visit: Payer: Self-pay | Admitting: *Deleted

## 2019-08-29 NOTE — Patient Outreach (Signed)
[facility]) [place] Arias Telephone Outreach PCP office completes Transition of Care follow up post-hospital discharge Post-hospital discharge day # 31 without unplanned hospital re-admission  08/29/2019  IRIDIAN READER 1951-04-27 [id number]  Successful telephone outreach toRosemarie "Raisa Ditto, referred to [facility] CM after EMMI red- alert received for general discharge; patient was subsequently engaged in [place] services after recenthospitalization September 30- May 10, 2019 for diverticulitis of large intestine/ colon with hemorrhage; patient had surgical colectomy with new ostomy placement. Patient was discharged home to self-care without home health services in place. Patient has history including, but not limited to, GERD; diverticulitis; hypothyroidism; and anxiety.  Unfortunately, patient experiencedhospital re-admission December 23-27, 209fr positive corona virus with complications. Patient was discharged home to self-care without home health services in place.  HIPAA/ identity verified with patient during phone call today.Patient again reports "doing much better, slowly getting back to normal" and she denies ongoing concerns/ issues/ concerns/ residual clinical symptoms due to recent corona virus diagnosis.  Confirms that she visited with PCP recently and visit was reviewed with patient.  She denies pain and sounds to be in no distress throughout call today.  Patient further reports:  -- continues working part time; pacing work day activities to prevent fatigue -- visited with surgical provider last week, 08/22/2019: reports plan is to wait for patient to fully recover from recent hospitalization before proceeding with ostomy reversal; next appointment scheduled 09/19/19- patient continues driving self to appointments -- no recent changes or concerns around medications -- continues independently self-managing ostomy care- husband  continues assisting as indicated; ostomy skin care discussed; patient monitoring skin and using desitin for itching as needed; reports has plenty of supplies  Patient denies further issues, concerns, or problems today. Iconfirmed that patient hasmy direct phone number, the main THN CM office phone number, and the [facility] 24-hour nurse advice phone number should issues arise prior to next scheduled [place] after next surgical provider office visit, unless indicated sooner.  Reiterated role of [facility]N RN CM with patient and encouraged patient to contact me directly if needs, questions, issues, or concerns arise prior to next scheduled outreach; patient agreed to do so.  Plan:  Patient will take medications as prescribed and will attend all scheduled provider appointments  Patient will promptly notify care providers for any new concerns/ issues/ problems that arise  I will share today's note/ care plan with patient's PCP, as [facility] quarterly update  THN Community CM outreach to continue with scheduled phonecall next month  [facility] Problem One     Most Recent Value  Care Plan Problem One  High risk for hospital readmission related to/ as evidenced by recent hospitalization December 23-27, 2020 for positive corona virus with complications  Role Documenting the Problem One  Care Management [facility] Centerfor Problem One  Not Active  THN Long Term Goal   Over the next 31 days, patient will not experience hospital re-admission, as evidenced by patient reporting and review of EMR [facility]  [facility] Goal Start Date  07/30/19  [facility] Surgery[facility] Term Goal Met Date  08/29/19 [[facility]]  Interventions for Problem One Long Term Goal  Disccused with patient current clinical condition and confirmed that she has no current clinical concerns and believes she is slowly getting better.  Confirmed that patient has had no further hospital admissions  since her last hospital discharge,  confoirmed  that she has had recent visit with PCP,  reviewed visit with patient    [facility] CM Care Plan Problem Two     Most Recent Value  Care Plan Problem Two  Self- health management of chronic disease state of diverticulitis, as evidenced by patient reporting  Role Documenting the Problem Two  Care Management [place] for Problem Two  Active  Interventions for Problem Two Long Term Goal   Reviewed recent surgical provider office visit with patient and discussed plan for ostomy reversal,  confirmed that patient is able to verbalize next scheduled appointment and has plans to attend as scheduled,  confirmed that she has ongoing reliable transportation  [place] Goal  Over the next 60 days, patient will verbalize plan for upcoming surgical revision of ileostomy, as evidenced by patient reporting of same   [place] Term Goal Start Date  07/30/19     [name], RN, BSN, [place] Coordinator [facility] [address] Care Management  [phone]

## 2019-09-05 DIAGNOSIS — E039 Hypothyroidism, unspecified: Secondary | ICD-10-CM | POA: Diagnosis not present

## 2019-09-05 DIAGNOSIS — S1093XD Contusion of unspecified part of neck, subsequent encounter: Secondary | ICD-10-CM | POA: Diagnosis not present

## 2019-09-05 DIAGNOSIS — H8112 Benign paroxysmal vertigo, left ear: Secondary | ICD-10-CM | POA: Diagnosis not present

## 2019-09-12 DIAGNOSIS — H8112 Benign paroxysmal vertigo, left ear: Secondary | ICD-10-CM | POA: Diagnosis not present

## 2019-09-19 ENCOUNTER — Ambulatory Visit (INDEPENDENT_AMBULATORY_CARE_PROVIDER_SITE_OTHER): Payer: Medicare HMO | Admitting: Surgery

## 2019-09-19 ENCOUNTER — Encounter: Payer: Self-pay | Admitting: Surgery

## 2019-09-19 ENCOUNTER — Ambulatory Visit: Payer: Self-pay | Admitting: Surgery

## 2019-09-19 ENCOUNTER — Other Ambulatory Visit: Payer: Self-pay

## 2019-09-19 VITALS — BP 136/91 | HR 86 | Temp 95.2°F | Resp 12 | Ht 63.0 in | Wt 138.4 lb

## 2019-09-19 DIAGNOSIS — Z932 Ileostomy status: Secondary | ICD-10-CM | POA: Diagnosis not present

## 2019-09-19 NOTE — Patient Instructions (Addendum)
  Our surgery scheduler will contact you within 24-48 hours to schedule your surgery. Please have the [place] surgery sheet available when speaking with her.      GENERAL POST-OPERATIVE PATIENT INSTRUCTIONS   FOLLOW-UP:  Please make an appointment with your physician in.  Call your physician immediately if you have any fevers greater than 102.5, drainage from you wound that is not clear or looks infected, persistent bleeding, increasing abdominal pain, problems urinating, or persistent nausea/vomiting.    WOUND CARE INSTRUCTIONS:  Keep a dry clean dressing on the wound if there is drainage. The initial bandage may be removed after 24 hours.  Once the wound has quit draining you may leave it open to air.  If clothing rubs against the wound or causes irritation and the wound is not draining you may cover it with a dry dressing during the daytime.  Try to keep the wound dry and avoid ointments on the wound unless directed to do so.  If the wound becomes bright red and painful or starts to drain infected material that is not clear, please contact your physician immediately.  If the wound is mildly pink and has a thick firm ridge underneath it, this is normal, and is referred to as a healing ridge.  This will resolve over the next 4-6 weeks.  DIET:  You may eat any foods that you can tolerate.  It is a good idea to eat a high fiber diet and take in plenty of fluids to prevent constipation.  If you do become constipated you may want to take a mild laxative or take ducolax tablets on a daily basis until your bowel habits are regular.  Constipation can be very uncomfortable, along with straining, after recent surgery.  ACTIVITY:  You are encouraged to cough and deep breath or use your incentive spirometer if you were given one, every 15-30 minutes when awake.  This will help prevent respiratory complications and low grade fevers post-operatively if you had a general anesthetic.  You may want to hug a pillow when  coughing and sneezing to add additional support to the surgical area, if you had abdominal or chest surgery, which will decrease pain during these times.  You are encouraged to walk and engage in light activity for the next two weeks.  You should not lift more than 20 pounds during this time frame as it could put you at increased risk for complications.  Twenty pounds is roughly equivalent to a plastic bag of groceries.    MEDICATIONS:  Try to take narcotic medications and anti-inflammatory medications, such as tylenol, ibuprofen, naprosyn, etc., with food.  This will minimize stomach upset from the medication.  Should you develop nausea and vomiting from the pain medication, or develop a rash, please discontinue the medication and contact your physician.  You should not drive, make important decisions, or operate machinery when taking narcotic pain medication.  QUESTIONS:  Please feel free to call your physician or the hospital operator if you have any questions, and they will be glad to assist you.

## 2019-09-24 ENCOUNTER — Encounter: Payer: Self-pay | Admitting: Surgery

## 2019-09-24 NOTE — H&P (View-Only) (Signed)
Outpatient Surgical Follow Up  09/24/2019  Julie Arias is an 69 y.o. female.   Chief Complaint  Patient presents with  . Follow-up    Post op- LAPAROSCOPIC SIGMOID COLON RESECTION- sx 05/04/19 Julie Arias    HPI: Julie Arias is a 69 year old female well-known to me with a prior history of diverticulitis requiring low anterior resection and diverting loop ileostomy.  More recently she developed right neck hematoma that has resolved.  More importantly she did develop Covid requiring hospitalization.  She did have an acute kidney injury related to hypovolemia.  she is now recovered and is doing very well.  She is able to eat, no nausea no vomiting abdominal pain ostomy is working well. I have personally reviewed her barium enema showing no evidence of stricture.  No other concerning lesions. She is strong and  looking forward to her reversal. Recent labs including a CBC and CMP that I have reviewed and they were completely normal.   Past Medical History:  Diagnosis Date  . Diverticulitis    caused "perforated colon" hospitalized- no surgery  . GERD (gastroesophageal reflux disease)   . Hypothyroidism   . Thyroid disease     Past Surgical History:  Procedure Laterality Date  . ABDOMINAL HYSTERECTOMY    . COLON RESECTION N/A 05/04/2019   Procedure: LAPAROSCOPIC SIGMOID COLON RESECTION;  Surgeon: Jules Husbands, MD;  Location: [facility] ORS;  Service: General;  Laterality: N/A;  . COLONOSCOPY W/ POLYPECTOMY    . COLONOSCOPY WITH PROPOFOL N/A 07/07/2015   Procedure: COLONOSCOPY WITH PROPOFOL;  Surgeon: Garlan Fair, MD;  Location: [facility] ENDOSCOPY;  Service: Endoscopy;  Laterality: N/A;  . COLONOSCOPY WITH PROPOFOL N/A 04/06/2019   Procedure: COLONOSCOPY WITH PROPOFOL;  Surgeon: Jonathon Bellows, MD;  Location: [facility] ENDOSCOPY;  Service: Gastroenterology;  Laterality: N/A;  . GANGLION CYST EXCISION  12/2018    Family History  Problem Relation Age of Onset  . Lung disease Father   . Lung cancer  Brother     Social History:  reports that she quit smoking about 15 years ago. Her smoking use included cigarettes. She has a 25.00 pack-year smoking history. She has never used smokeless tobacco. She reports that she does not drink alcohol or use drugs.  Allergies:  Allergies  Allergen Reactions  . Aleve [Naproxen Sodium] Rash    Medications reviewed.    ROS Full ROS performed and is otherwise negative other than what is stated in HPI   BP (!) 136/91   Pulse 86   Temp (!) 95.2 F (35.1 C) (Temporal)   Resp 12   Ht 5\' 3"  (1.6 m)   Wt 138 lb 6.4 oz (62.8 kg)   SpO2 95%   BMI 24.52 kg/m   Physical Exam Vitals and nursing note reviewed. Exam conducted with a chaperone present.  Constitutional:      General: She is not in acute distress.    Appearance: Normal appearance.  Eyes:     General: No scleral icterus.       Right eye: No discharge.        Left eye: No discharge.  Cardiovascular:     Rate and Rhythm: Normal rate and regular rhythm.     Heart sounds: No murmur.  Pulmonary:     Effort: Pulmonary effort is normal. No respiratory distress.     Breath sounds: No stridor. No wheezing or rhonchi.  Abdominal:     General: Abdomen is flat. There is no distension.  Palpations: There is no mass.     Tenderness: There is no abdominal tenderness. There is no guarding.     Hernia: No hernia is present.     Comments: Ileostomy in place  Musculoskeletal:        General: Normal range of motion.     Cervical back: Normal range of motion and neck supple. No rigidity or tenderness.  Skin:    General: Skin is warm and dry.     Capillary Refill: Capillary refill takes less than 2 seconds.  Neurological:     General: No focal deficit present.     Mental Status: She is alert and oriented to person, place, and time.  Psychiatric:        Mood and Affect: Mood normal.        Behavior: Behavior normal.        Thought Content: Thought content normal.        Judgment:  Judgment normal.        Assessment/Plan:  1. Ileostomy in place [facility]) Cussed with the patient in detail about the ileostomy takedown.  Risk benefit and possible complications including but not limited to: Bleeding infection, anastomotic leak.  We will address component of proximal hernia if in fact one is found .  Currently difficult to completely rule it out.  Greater than 50% of the 40 minutes  visit was spent in counseling/coordination of care   Caroleen Hamman, MD [facility] Surgeon

## 2019-09-24 NOTE — Progress Notes (Signed)
Outpatient Surgical Follow Up  09/24/2019  Julie Arias is an 69 y.o. female.   Chief Complaint  Patient presents with  . Follow-up    Post op- LAPAROSCOPIC SIGMOID COLON RESECTION- sx 05/04/19 Julie Arias    HPI: Julie Arias is a 69 year old female well-known to me with a prior history of diverticulitis requiring low anterior resection and diverting loop ileostomy.  More recently she developed right neck hematoma that has resolved.  More importantly she did develop Covid requiring hospitalization.  She did have an acute kidney injury related to hypovolemia.  she is now recovered and is doing very well.  She is able to eat, no nausea no vomiting abdominal pain ostomy is working well. I have personally reviewed her barium enema showing no evidence of stricture.  No other concerning lesions. She is strong and  looking forward to her reversal. Recent labs including a CBC and CMP that I have reviewed and they were completely normal.   Past Medical History:  Diagnosis Date  . Diverticulitis    caused "perforated colon" hospitalized- no surgery  . GERD (gastroesophageal reflux disease)   . Hypothyroidism   . Thyroid disease     Past Surgical History:  Procedure Laterality Date  . ABDOMINAL HYSTERECTOMY    . COLON RESECTION N/A 05/04/2019   Procedure: LAPAROSCOPIC SIGMOID COLON RESECTION;  Surgeon: Jules Husbands, MD;  Location: [facility] ORS;  Service: General;  Laterality: N/A;  . COLONOSCOPY W/ POLYPECTOMY    . COLONOSCOPY WITH PROPOFOL N/A 07/07/2015   Procedure: COLONOSCOPY WITH PROPOFOL;  Surgeon: Garlan Fair, MD;  Location: [facility] ENDOSCOPY;  Service: Endoscopy;  Laterality: N/A;  . COLONOSCOPY WITH PROPOFOL N/A 04/06/2019   Procedure: COLONOSCOPY WITH PROPOFOL;  Surgeon: Jonathon Bellows, MD;  Location: [facility] ENDOSCOPY;  Service: Gastroenterology;  Laterality: N/A;  . GANGLION CYST EXCISION  12/2018    Family History  Problem Relation Age of Onset  . Lung disease Father   . Lung cancer  Brother     Social History:  reports that she quit smoking about 15 years ago. Her smoking use included cigarettes. She has a 25.00 pack-year smoking history. She has never used smokeless tobacco. She reports that she does not drink alcohol or use drugs.  Allergies:  Allergies  Allergen Reactions  . Aleve [Naproxen Sodium] Rash    Medications reviewed.    ROS Full ROS performed and is otherwise negative other than what is stated in HPI   BP (!) 136/91   Pulse 86   Temp (!) 95.2 F (35.1 C) (Temporal)   Resp 12   Ht 5\' 3"  (1.6 m)   Wt 138 lb 6.4 oz (62.8 kg)   SpO2 95%   BMI 24.52 kg/m   Physical Exam Vitals and nursing note reviewed. Exam conducted with a chaperone present.  Constitutional:      General: She is not in acute distress.    Appearance: Normal appearance.  Eyes:     General: No scleral icterus.       Right eye: No discharge.        Left eye: No discharge.  Cardiovascular:     Rate and Rhythm: Normal rate and regular rhythm.     Heart sounds: No murmur.  Pulmonary:     Effort: Pulmonary effort is normal. No respiratory distress.     Breath sounds: No stridor. No wheezing or rhonchi.  Abdominal:     General: Abdomen is flat. There is no distension.  Palpations: There is no mass.     Tenderness: There is no abdominal tenderness. There is no guarding.     Hernia: No hernia is present.     Comments: Ileostomy in place  Musculoskeletal:        General: Normal range of motion.     Cervical back: Normal range of motion and neck supple. No rigidity or tenderness.  Skin:    General: Skin is warm and dry.     Capillary Refill: Capillary refill takes less than 2 seconds.  Neurological:     General: No focal deficit present.     Mental Status: She is alert and oriented to person, place, and time.  Psychiatric:        Mood and Affect: Mood normal.        Behavior: Behavior normal.        Thought Content: Thought content normal.        Judgment:  Judgment normal.        Assessment/Plan:  1. Ileostomy in place [facility]) Cussed with the patient in detail about the ileostomy takedown.  Risk benefit and possible complications including but not limited to: Bleeding infection, anastomotic leak.  We will address component of proximal hernia if in fact one is found .  Currently difficult to completely rule it out.  Greater than 50% of the 40 minutes  visit was spent in counseling/coordination of care   Caroleen Hamman, MD [facility] Surgeon

## 2019-09-26 ENCOUNTER — Encounter: Payer: Self-pay | Admitting: *Deleted

## 2019-09-26 ENCOUNTER — Ambulatory Visit: Payer: Self-pay | Admitting: Surgery

## 2019-09-26 ENCOUNTER — Other Ambulatory Visit: Payer: Self-pay | Admitting: *Deleted

## 2019-09-26 DIAGNOSIS — H8112 Benign paroxysmal vertigo, left ear: Secondary | ICD-10-CM | POA: Diagnosis not present

## 2019-09-26 NOTE — Patient Outreach (Signed)
[facility]) Care Management [place] hospital discharge day # ___ without unplanned hospital readmisssion  09/26/2019  Julie Arias [birth year] [id number]  Successful telephone outreach toRosemarie "Kennethia Lynes, referred to [facility] CM after EMMI red- alert received for general discharge; patient was subsequently engaged in [facility] services after recenthospitalization September 30- May 10, 2019 for diverticulitis of large intestine/ colon with hemorrhage; patient had surgical colectomy with new ostomy placement. Patient was discharged home to self-care without home health services in place. Patient has history including, but not limited to, GERD; diverticulitis; hypothyroidism; and anxiety.  Unfortunately, patient experiencedhospital re-admission December 23-27, 2039fr positive corona virus with complications. Patient was discharged home to self-care without home health services in place.  HIPAA/ identity verified with patient during phone call today.Patient reports today that she "is doing great," and she denies pain, clinical concerns/ problems, and sounds to be in no distress throughout entirety of phone call today.  Patient further reports:  -- continues working part time; continues pacing work day activities to prevent fatigue- notes she has much more energy and has started gaining weight back after losing weight from corona virus/ previous flare of diverticulitis -- visited with surgical provider last week, 09/19/2019: reports plan is to proceed with surgical reversal of ostomy in mid- March, around plans patient has to take family vacation in early March -- has not received corona virus vaccine- states she was told she has to wait for a full 3 months after having had the virus before obtaining first vaccine; reports she is currently on waiting list to receive once she is cleared -- no recent changes or concerns around  medications -- continues independently self-managing ostomy care- husband continues assisting as indicated; ostomy skin care reviewed; patient monitoring skin and using desitin for itching as needed; reports has plenty of supplies- denies concerns aorund care of ostomy  Patient denies further issues, concerns, or problems today. Iconfirmed that patient hasmy direct phone number, the main [facility] CM office phone number, and the [facility] 24-hour nurse advice phone number should issues arise prior to next scheduled THardymonth after next surgical provider office visit, unless indicated sooner.  Reiterated role of [facility] RN CM with patient and encouraged patient to contact me directly if needs, questions, issues, or concerns arise prior to next scheduled outreach; patient agreed to do so.  Plan:  Patient will take medications as prescribed and will attend all scheduled provider appointments  Patient will promptly notify care providers for any new concerns/ issues/ problems that arise  [facility] to continue with scheduled phonecall next month, hopefully post- upcoming elective surgery for revision of ostomy  [facility] Care Plan Problem Two     Most Recent Value  Care Plan Problem Two  Self- health management of chronic disease state of diverticulitis, as evidenced by patient reporting  Role Documenting the Problem Two  Care Management Coordinator  Care Plan for Problem Two  Active  Interventions for Problem Two Long Term Goal   Verified that patient attended office visit on 09/19/19 with surgeon and that plan for surgery for ostomy reversal is in place for mid-March  [facility] Goal  Over the next 60 days, patient will verbalize plan for upcoming surgical revision of ileostomy, as evidenced by patient reporting of same   TPh[facility] Goal Start Date  07/30/19  [facility] Goal Met Date  09/26/19 [Goal Met]  [facility] CM  Short Term Goal #1   Over the next 30  days, patient will promptly report any new concerns/ issues/ problems to care providers as evidenced by patient reporting and review of EMR/ collaboration with care providers as indicated during [facility] CM outreach  [facility] CM Short Term Goal #1 Start Date  09/26/19  Interventions for Short Term Goal #2   Discussed current clinical conditon with patient and confirmed that she has not current clinical concerns,  discussed importance of continuing to follow precautions for corona virus prevention/ vaccine/ promptly notifying care providers for any new concerns,  confirmed that patient has all contact information for care providers  [facility] CM Short Term Goal #2   Over the next 30 days, patient will have elective surgery for elective reversal of ostomy as evidenced by patient reporting and review of EMR during [facility] RN CM outreach  [facility] CM Short Term Goal #2 Start Date  09/26/19  Interventions for Short Term Goal #2  Discussed with patient general plan for upcoming elective surgery to reverse ostomy,  coached patient in talking points to cover with surgeon for post-operative plan re: diet/ expected progression of bowel movements/ activity/ return to work,  encouraged patient to write down all of her questions re: post-op instructions proactively, prior to surgery occurring     [name], RN, BSN, [place] Coordinator [facility] Care Management  [phone]

## 2019-10-01 ENCOUNTER — Telehealth: Payer: Self-pay | Admitting: Surgery

## 2019-10-01 NOTE — Telephone Encounter (Signed)
Pt has been advised of pre admission date/time, Covid Testing date and Surgery date.  Surgery Date: 10/19/19 Preadmission Testing Date: 10/15/19 (8a-1p) Covid Testing Date: 10/17/19 - patient advised to go to the [facility] ([place])  Patient has been made aware to call [phone], between 1-3:00pm the day before surgery, to find out what time to arrive.

## 2019-10-03 DIAGNOSIS — H8112 Benign paroxysmal vertigo, left ear: Secondary | ICD-10-CM | POA: Diagnosis not present

## 2019-10-09 ENCOUNTER — Telehealth: Payer: Self-pay | Admitting: *Deleted

## 2019-10-09 NOTE — Telephone Encounter (Signed)
Patient instructed that there is no prep needed for a ileostomy take down as it involves the small bowel. Instructed she may want her last meal to be light, but otherwise no special prep needed.

## 2019-10-09 NOTE — Telephone Encounter (Signed)
Patient called and is to have surgery on 10/19/19 by Dr Carlis Abbott takedown and she wants to know what kind of prep she needs to do. Please call and advise

## 2019-10-15 ENCOUNTER — Other Ambulatory Visit
Admission: RE | Admit: 2019-10-15 | Discharge: 2019-10-15 | Disposition: A | Discharge status: Home / Self Care | Payer: Medicare HMO | Source: Ambulatory Visit | Attending: Surgery | Admitting: Surgery

## 2019-10-15 ENCOUNTER — Other Ambulatory Visit: Payer: Self-pay

## 2019-10-15 HISTORY — DX: COVID-19: U07.1

## 2019-10-15 NOTE — Patient Instructions (Signed)
Your procedure is scheduled on: Friday October 15, 2019 Report to Day Surgery on the 2nd floor of the [facility]. To find out your arrival time, please call [phone] between 1PM - 3PM on: October 14, 2019 [place]  REMEMBER: Instructions that are not followed completely may result in serious medical risk, up to and including death; or upon the discretion of your surgeon and anesthesiologist your surgery may need to be rescheduled.  Do not eat food after midnight the night before surgery.  No gum chewing, lozengers or hard candies.  You may however, drink CLEAR liquids up to 2 hours before you are scheduled to arrive for your surgery. Do not drink anything within 2 hours of the start of your surgery.  Clear liquids include: - water  - apple juice without pulp - gatorade (not RED) - black coffee or tea (Do NOT add milk or creamers to the coffee or tea) Do NOT drink anything that is not on this list.  Type 1 and Type 2 diabetics should only drink water.   TAKE THESE MEDICATIONS THE MORNING OF SURGERY WITH A SIP OF WATER: EUTHYROX FLEXERIL VALTREX OMEPRAZOLE (take one the night before and one on the morning of surgery - helps to prevent nausea after surgery.)  Follow recommendations from Cardiologist, Pulmonologist or PCP regarding stopping Aspirin, Coumadin, Plavix, Eliquis, Pradaxa, or Pletal.  Stop Anti-inflammatories (NSAIDS) such as Advil, Aleve, Ibuprofen, Motrin, Naproxen, Naprosyn and Aspirin based products such as Excedrin, Goodys Powder, BC Powder. (May take Tylenol or Acetaminophen if needed.)  Stop ANY OVER THE COUNTER supplements until after surgery. (May continue Vitamin D, Vitamin B, and multivitamin.)  No Alcohol for 24 hours before or after surgery.  No Smoking including e-cigarettes for 24 hours prior to surgery.  No chewable tobacco products for at least 6 hours prior to surgery.  No nicotine patches on the day of surgery.  On the morning of surgery brush  your teeth with toothpaste and water, you may rinse your mouth with mouthwash if you wish. Do not swallow any toothpaste or mouthwash.  Do not wear jewelry, make-up, hairpins, clips or nail polish.  Do not wear lotions, powders, or perfumes or deodorant.   Do not shave 48 hours prior to surgery.   Contact lenses, hearing aids and dentures may not be worn into surgery.  Do not bring valuables to the hospital, including drivers license, insurance or credit cards.  [facility] is not responsible for any belongings or valuables.   Use CHG Soap  as directed on instruction sheet.  Notify your doctor if there is any change in your medical condition (cold, fever, infection).  Wear comfortable clothing (specific to your surgery type) to the hospital.  Plan for stool softeners for home use.  If you are being admitted to the hospital overnight, may bring small bag to hospital. After surgery it may be brought to your room.  If you are being discharged the day of surgery, you will not be allowed to drive home. You will need a responsible adult to drive you home and stay with you that night.   If you are taking public transportation, you will need to have a responsible adult with you. Please confirm with your physician that it is acceptable to use public transportation.   Please call [phone] if you have any questions about these instructions.  Visitation Policy:  Patients undergoing a surgery or procedure in a hospital may have one family member or support person with  them as long as that person is not COVID-19 positive or experiencing its symptoms. That person may remain in the waiting area during the procedure. Should the patient need to stay at the hospital during part of their recovery, the support person may visit during visiting hours; 10 am to 8 pm.  Children under 7 years of age may have both parents or legal guardians with them during their hospital stay.

## 2019-10-17 ENCOUNTER — Other Ambulatory Visit
Admission: RE | Admit: 2019-10-17 | Discharge: 2019-10-17 | Disposition: A | Discharge status: Home / Self Care | Payer: Medicare HMO | Source: Ambulatory Visit | Attending: Surgery | Admitting: Surgery

## 2019-10-17 ENCOUNTER — Other Ambulatory Visit: Payer: Self-pay

## 2019-10-17 DIAGNOSIS — Z20822 Contact with and (suspected) exposure to covid-19: Secondary | ICD-10-CM | POA: Insufficient documentation

## 2019-10-17 DIAGNOSIS — Z01812 Encounter for preprocedural laboratory examination: Secondary | ICD-10-CM | POA: Insufficient documentation

## 2019-10-17 LAB — SARS CORONAVIRUS 2 (TAT 6-24 HRS): SARS Coronavirus 2: NEGATIVE

## 2019-10-18 MED ORDER — SODIUM CHLORIDE 0.9 % IV SOLN
2.0000 g | INTRAVENOUS | Status: AC
  Administered 2019-10-19: 2 g via INTRAVENOUS
  Filled 2019-10-18: qty 2

## 2019-10-19 ENCOUNTER — Inpatient Hospital Stay
Admission: RE | Admit: 2019-10-19 | Discharge: 2019-10-22 | DRG: 331 | Disposition: A | Discharge status: Home / Self Care | Payer: Medicare HMO | Attending: Surgery | Admitting: Surgery

## 2019-10-19 ENCOUNTER — Other Ambulatory Visit: Payer: Self-pay

## 2019-10-19 ENCOUNTER — Inpatient Hospital Stay: Payer: Medicare HMO | Admitting: Anesthesiology

## 2019-10-19 ENCOUNTER — Encounter: Payer: Self-pay | Admitting: Surgery

## 2019-10-19 ENCOUNTER — Encounter
Admission: RE | Disposition: A | Discharge status: Home / Self Care | Payer: Self-pay | Source: Home / Self Care | Attending: Surgery

## 2019-10-19 DIAGNOSIS — Z20822 Contact with and (suspected) exposure to covid-19: Secondary | ICD-10-CM | POA: Diagnosis present

## 2019-10-19 DIAGNOSIS — Z432 Encounter for attention to ileostomy: Secondary | ICD-10-CM | POA: Diagnosis not present

## 2019-10-19 DIAGNOSIS — E039 Hypothyroidism, unspecified: Secondary | ICD-10-CM | POA: Diagnosis present

## 2019-10-19 DIAGNOSIS — Z9889 Other specified postprocedural states: Secondary | ICD-10-CM | POA: Diagnosis present

## 2019-10-19 DIAGNOSIS — K5900 Constipation, unspecified: Secondary | ICD-10-CM | POA: Diagnosis not present

## 2019-10-19 DIAGNOSIS — Z86010 Personal history of colonic polyps: Secondary | ICD-10-CM | POA: Diagnosis not present

## 2019-10-19 DIAGNOSIS — Z8719 Personal history of other diseases of the digestive system: Secondary | ICD-10-CM

## 2019-10-19 DIAGNOSIS — Z9049 Acquired absence of other specified parts of digestive tract: Secondary | ICD-10-CM

## 2019-10-19 DIAGNOSIS — Z886 Allergy status to analgesic agent status: Secondary | ICD-10-CM | POA: Diagnosis not present

## 2019-10-19 DIAGNOSIS — Z8616 Personal history of COVID-19: Secondary | ICD-10-CM | POA: Diagnosis not present

## 2019-10-19 DIAGNOSIS — Z836 Family history of other diseases of the respiratory system: Secondary | ICD-10-CM | POA: Diagnosis not present

## 2019-10-19 DIAGNOSIS — Z801 Family history of malignant neoplasm of trachea, bronchus and lung: Secondary | ICD-10-CM | POA: Diagnosis not present

## 2019-10-19 DIAGNOSIS — Z87891 Personal history of nicotine dependence: Secondary | ICD-10-CM

## 2019-10-19 DIAGNOSIS — Z90710 Acquired absence of both cervix and uterus: Secondary | ICD-10-CM

## 2019-10-19 DIAGNOSIS — Z932 Ileostomy status: Secondary | ICD-10-CM

## 2019-10-19 HISTORY — PX: ILEOSTOMY CLOSURE: SHX1784

## 2019-10-19 LAB — CBC
HCT: 42.6 % (ref 36.0–46.0)
Hemoglobin: 14.2 g/dL (ref 12.0–15.0)
MCH: 30.2 pg (ref 26.0–34.0)
MCHC: 33.3 g/dL (ref 30.0–36.0)
MCV: 90.6 fL (ref 80.0–100.0)
Platelets: 177 10*3/uL (ref 150–400)
RBC: 4.7 MIL/uL (ref 3.87–5.11)
RDW: 13.6 % (ref 11.5–15.5)
WBC: 9.1 10*3/uL (ref 4.0–10.5)
nRBC: 0 % (ref 0.0–0.2)

## 2019-10-19 LAB — POCT I-STAT, CHEM 8
BUN: 8 mg/dL (ref 8–23)
Calcium, Ion: 1.17 mmol/L (ref 1.15–1.40)
Chloride: 101 mmol/L (ref 98–111)
Creatinine, Ser: 0.7 mg/dL (ref 0.44–1.00)
Glucose, Bld: 92 mg/dL (ref 70–99)
HCT: 44 % (ref 36.0–46.0)
Hemoglobin: 15 g/dL (ref 12.0–15.0)
Potassium: 3.8 mmol/L (ref 3.5–5.1)
Sodium: 138 mmol/L (ref 135–145)
TCO2: 28 mmol/L (ref 22–32)

## 2019-10-19 LAB — CREATININE, SERUM
Creatinine, Ser: 0.63 mg/dL (ref 0.44–1.00)
GFR calc Af Amer: >60 mL/min (ref >60)
GFR calc non Af Amer: >60 mL/min (ref >60)

## 2019-10-19 SURGERY — CLOSURE, ILEOSTOMY
Anesthesia: General

## 2019-10-19 MED ORDER — PROMETHAZINE HCL 25 MG/ML IJ SOLN
6.2500 mg | INTRAMUSCULAR | Status: AC | PRN
  Administered 2019-10-19: 6.25 mg via INTRAVENOUS

## 2019-10-19 MED ORDER — OXYCODONE HCL 5 MG PO TABS: 5.0000 mg | ORAL_TABLET | Freq: Once | ORAL | Status: DC | PRN

## 2019-10-19 MED ORDER — LORAZEPAM 0.5 MG PO TABS: 0.5000 mg | ORAL_TABLET | Freq: Every evening | ORAL | Status: DC | PRN

## 2019-10-19 MED ORDER — PANTOPRAZOLE SODIUM 40 MG PO TBEC
40.0000 mg | DELAYED_RELEASE_TABLET | Freq: Every day | ORAL | Status: DC
  Administered 2019-10-19 – 2019-10-22 (×4): 40 mg via ORAL
  Filled 2019-10-19 (×4): qty 1

## 2019-10-19 MED ORDER — SODIUM CHLORIDE FLUSH 0.9 % IV SOLN
INTRAVENOUS | Status: AC
  Filled 2019-10-19: qty 10

## 2019-10-19 MED ORDER — BUPIVACAINE-EPINEPHRINE (PF) 0.25% -1:200000 IJ SOLN
INTRAMUSCULAR | Status: AC
  Filled 2019-10-19: qty 30

## 2019-10-19 MED ORDER — TRAMADOL HCL 50 MG PO TABS: 50.0000 mg | ORAL_TABLET | Freq: Four times a day (QID) | ORAL | Status: DC | PRN

## 2019-10-19 MED ORDER — CHLORHEXIDINE GLUCONATE CLOTH 2 % EX PADS
6.0000 | MEDICATED_PAD | Freq: Once | CUTANEOUS | Status: AC
  Administered 2019-10-19: 6 via TOPICAL

## 2019-10-19 MED ORDER — SODIUM CHLORIDE 0.9 % IV SOLN
INTRAVENOUS | Status: DC | PRN
  Administered 2019-10-19: 70 mL

## 2019-10-19 MED ORDER — OXYCODONE HCL 5 MG/5ML PO SOLN: 5.0000 mg | Freq: Once | ORAL | Status: DC | PRN

## 2019-10-19 MED ORDER — KETOROLAC TROMETHAMINE 15 MG/ML IJ SOLN
15.0000 mg | Freq: Four times a day (QID) | INTRAMUSCULAR | Status: DC
  Administered 2019-10-19 – 2019-10-22 (×11): 15 mg via INTRAVENOUS
  Filled 2019-10-19 (×11): qty 1

## 2019-10-19 MED ORDER — FENTANYL CITRATE (PF) 100 MCG/2ML IJ SOLN
INTRAMUSCULAR | Status: AC
  Filled 2019-10-19: qty 2

## 2019-10-19 MED ORDER — GABAPENTIN 300 MG PO CAPS: 300.0000 mg | ORAL_CAPSULE | ORAL | Status: AC

## 2019-10-19 MED ORDER — SEVOFLURANE IN SOLN
RESPIRATORY_TRACT | Status: AC
  Filled 2019-10-19: qty 250

## 2019-10-19 MED ORDER — METHOCARBAMOL 500 MG PO TABS
500.0000 mg | ORAL_TABLET | Freq: Four times a day (QID) | ORAL | Status: DC | PRN
  Administered 2019-10-21 (×2): 500 mg via ORAL
  Filled 2019-10-19 (×2): qty 1

## 2019-10-19 MED ORDER — ONDANSETRON HCL 4 MG PO TABS: 4.0000 mg | ORAL_TABLET | Freq: Three times a day (TID) | ORAL | Status: DC | PRN

## 2019-10-19 MED ORDER — FENTANYL CITRATE (PF) 100 MCG/2ML IJ SOLN
INTRAMUSCULAR | Status: DC | PRN
  Administered 2019-10-19: 50 ug via INTRAVENOUS
  Administered 2019-10-19: 100 ug via INTRAVENOUS

## 2019-10-19 MED ORDER — ONDANSETRON HCL 4 MG/2ML IJ SOLN
INTRAMUSCULAR | Status: DC | PRN
  Administered 2019-10-19: 4 mg via INTRAVENOUS

## 2019-10-19 MED ORDER — PROMETHAZINE HCL 25 MG/ML IJ SOLN
INTRAMUSCULAR | Status: AC
  Administered 2019-10-19: 6.25 mg via INTRAVENOUS
  Filled 2019-10-19: qty 1

## 2019-10-19 MED ORDER — SUGAMMADEX SODIUM 200 MG/2ML IV SOLN
INTRAVENOUS | Status: DC | PRN
  Administered 2019-10-19: 150 mg via INTRAVENOUS

## 2019-10-19 MED ORDER — SODIUM CHLORIDE 0.9 % IV SOLN
2.0000 g | Freq: Three times a day (TID) | INTRAVENOUS | Status: AC
  Administered 2019-10-19 – 2019-10-20 (×2): 2 g via INTRAVENOUS
  Filled 2019-10-19 (×3): qty 2

## 2019-10-19 MED ORDER — DIPHENHYDRAMINE HCL 50 MG/ML IJ SOLN: 25.0000 mg | Freq: Four times a day (QID) | INTRAMUSCULAR | Status: DC | PRN

## 2019-10-19 MED ORDER — ONDANSETRON HCL 4 MG/2ML IJ SOLN: 4.0000 mg | Freq: Four times a day (QID) | INTRAMUSCULAR | Status: DC | PRN

## 2019-10-19 MED ORDER — CHLORHEXIDINE GLUCONATE CLOTH 2 % EX PADS: 6.0000 | MEDICATED_PAD | Freq: Once | CUTANEOUS | Status: DC

## 2019-10-19 MED ORDER — PROPOFOL 10 MG/ML IV BOLUS
INTRAVENOUS | Status: DC | PRN
  Administered 2019-10-19: 150 mg via INTRAVENOUS

## 2019-10-19 MED ORDER — PROCHLORPERAZINE EDISYLATE 10 MG/2ML IJ SOLN
5.0000 mg | Freq: Four times a day (QID) | INTRAMUSCULAR | Status: DC | PRN
  Filled 2019-10-19: qty 2

## 2019-10-19 MED ORDER — PROCHLORPERAZINE MALEATE 10 MG PO TABS
10.0000 mg | ORAL_TABLET | Freq: Four times a day (QID) | ORAL | Status: DC | PRN
  Filled 2019-10-19: qty 1

## 2019-10-19 MED ORDER — BUPIVACAINE LIPOSOME 1.3 % IJ SUSP
INTRAMUSCULAR | Status: AC
  Filled 2019-10-19: qty 20

## 2019-10-19 MED ORDER — BUPIVACAINE-EPINEPHRINE 0.25% -1:200000 IJ SOLN
INTRAMUSCULAR | Status: DC | PRN
  Administered 2019-10-19: 30 mL

## 2019-10-19 MED ORDER — ROCURONIUM BROMIDE 100 MG/10ML IV SOLN
INTRAVENOUS | Status: DC | PRN
  Administered 2019-10-19: 50 mg via INTRAVENOUS

## 2019-10-19 MED ORDER — EPHEDRINE SULFATE 50 MG/ML IJ SOLN
INTRAMUSCULAR | Status: DC | PRN
  Administered 2019-10-19: 5 mg via INTRAVENOUS
  Administered 2019-10-19: 10 mg via INTRAVENOUS

## 2019-10-19 MED ORDER — PREGABALIN 50 MG PO CAPS
100.0000 mg | ORAL_CAPSULE | Freq: Three times a day (TID) | ORAL | Status: DC
  Administered 2019-10-19 – 2019-10-22 (×9): 100 mg via ORAL
  Filled 2019-10-19 (×5): qty 2
  Filled 2019-10-19: qty 4
  Filled 2019-10-19 (×3): qty 2

## 2019-10-19 MED ORDER — MEPERIDINE HCL 50 MG/ML IJ SOLN: 6.2500 mg | INTRAMUSCULAR | Status: DC | PRN

## 2019-10-19 MED ORDER — LEVOTHYROXINE SODIUM 175 MCG PO TABS
175.0000 ug | ORAL_TABLET | Freq: Every day | ORAL | Status: DC
  Administered 2019-10-20 – 2019-10-22 (×3): 175 ug via ORAL
  Filled 2019-10-19 (×3): qty 1

## 2019-10-19 MED ORDER — HYDROMORPHONE HCL 1 MG/ML IJ SOLN
0.5000 mg | INTRAMUSCULAR | Status: DC | PRN
  Administered 2019-10-20 – 2019-10-21 (×2): 0.5 mg via INTRAVENOUS
  Filled 2019-10-19 (×2): qty 0.5

## 2019-10-19 MED ORDER — DEXAMETHASONE SODIUM PHOSPHATE 10 MG/ML IJ SOLN
INTRAMUSCULAR | Status: DC | PRN
  Administered 2019-10-19: 10 mg via INTRAVENOUS

## 2019-10-19 MED ORDER — PROPOFOL 10 MG/ML IV BOLUS
INTRAVENOUS | Status: AC
  Filled 2019-10-19: qty 20

## 2019-10-19 MED ORDER — SODIUM CHLORIDE 0.9 % IV SOLN: INTRAVENOUS | Status: DC

## 2019-10-19 MED ORDER — ACETAMINOPHEN 500 MG PO TABS: 1000.0000 mg | ORAL_TABLET | ORAL | Status: AC

## 2019-10-19 MED ORDER — ACETAMINOPHEN 500 MG PO TABS
1000.0000 mg | ORAL_TABLET | Freq: Four times a day (QID) | ORAL | Status: DC
  Administered 2019-10-19 – 2019-10-22 (×10): 1000 mg via ORAL
  Filled 2019-10-19 (×11): qty 2

## 2019-10-19 MED ORDER — ENOXAPARIN SODIUM 40 MG/0.4ML ~~LOC~~ SOLN
40.0000 mg | SUBCUTANEOUS | Status: DC
  Administered 2019-10-20 – 2019-10-22 (×3): 40 mg via SUBCUTANEOUS
  Filled 2019-10-19 (×3): qty 0.4

## 2019-10-19 MED ORDER — SODIUM CHLORIDE (PF) 0.9 % IJ SOLN
INTRAMUSCULAR | Status: AC
  Filled 2019-10-19: qty 50

## 2019-10-19 MED ORDER — FENTANYL CITRATE (PF) 100 MCG/2ML IJ SOLN: 25.0000 ug | INTRAMUSCULAR | Status: DC | PRN

## 2019-10-19 MED ORDER — ONDANSETRON 4 MG PO TBDP: 4.0000 mg | ORAL_TABLET | Freq: Four times a day (QID) | ORAL | Status: DC | PRN

## 2019-10-19 MED ORDER — OXYCODONE HCL 5 MG PO TABS
5.0000 mg | ORAL_TABLET | ORAL | Status: DC | PRN
  Administered 2019-10-19 – 2019-10-21 (×5): 10 mg via ORAL
  Filled 2019-10-19 (×6): qty 2

## 2019-10-19 MED ORDER — ACETAMINOPHEN 500 MG PO TABS
ORAL_TABLET | ORAL | Status: AC
  Administered 2019-10-19: 1000 mg via ORAL
  Filled 2019-10-19: qty 2

## 2019-10-19 MED ORDER — GABAPENTIN 300 MG PO CAPS
ORAL_CAPSULE | ORAL | Status: AC
  Administered 2019-10-19: 300 mg via ORAL
  Filled 2019-10-19: qty 1

## 2019-10-19 MED ORDER — DIPHENHYDRAMINE HCL 25 MG PO CAPS: 25.0000 mg | ORAL_CAPSULE | Freq: Four times a day (QID) | ORAL | Status: DC | PRN

## 2019-10-19 MED ORDER — LIDOCAINE HCL (CARDIAC) PF 100 MG/5ML IV SOSY
PREFILLED_SYRINGE | INTRAVENOUS | Status: DC | PRN
  Administered 2019-10-19: 80 mg via INTRAVENOUS

## 2019-10-19 MED ORDER — CYCLOBENZAPRINE HCL 10 MG PO TABS
5.0000 mg | ORAL_TABLET | Freq: Three times a day (TID) | ORAL | Status: DC | PRN
  Administered 2019-10-21: 5 mg via ORAL
  Filled 2019-10-19: qty 1

## 2019-10-19 MED ORDER — LACTATED RINGERS IV SOLN: INTRAVENOUS | Status: DC

## 2019-10-19 MED ORDER — PHENYLEPHRINE HCL (PRESSORS) 10 MG/ML IV SOLN
INTRAVENOUS | Status: DC | PRN
  Administered 2019-10-19 (×2): 100 ug via INTRAVENOUS
  Administered 2019-10-19: 50 ug via INTRAVENOUS

## 2019-10-19 SURGICAL SUPPLY — 54 items
APL PRP STRL LF DISP 70% ISPRP (MISCELLANEOUS)
APPLIER CLIP 11 MED OPEN (CLIP)
APPLIER CLIP 13 LRG OPEN (CLIP)
APR CLP LRG 13 20 CLIP (CLIP)
APR CLP MED 11 20 MLT OPN (CLIP)
BLADE CLIPPER SURG (BLADE) ×1 IMPLANT
BLADE SURG 15 STRL LF DISP TIS (BLADE) IMPLANT
BLADE SURG 15 STRL SS (BLADE) ×2
BNDG CONFORM 2 STRL LF (GAUZE/BANDAGES/DRESSINGS) ×2 IMPLANT
CATH URET ROBINSON 16FR STRL (CATHETERS) ×1 IMPLANT
CHLORAPREP W/TINT 26 (MISCELLANEOUS) ×1 IMPLANT
CLIP APPLIE 11 MED OPEN (CLIP) IMPLANT
CLIP APPLIE 13 LRG OPEN (CLIP) IMPLANT
COVER WAND RF STERILE (DRAPES) ×2 IMPLANT
DRAPE LAPAROTOMY 100X77 ABD (DRAPES) ×2 IMPLANT
DRAPE UNDER BUTTOCK W/FLU (DRAPES) IMPLANT
DRSG OPSITE POSTOP 4X6 (GAUZE/BANDAGES/DRESSINGS) ×2 IMPLANT
DRSG TELFA 3X8 NADH (GAUZE/BANDAGES/DRESSINGS) IMPLANT
DRSG TELFA 4X3 1S NADH ST (GAUZE/BANDAGES/DRESSINGS) ×1 IMPLANT
ELECT BLADE 6.5 EXT (BLADE) ×2 IMPLANT
ELECT CAUTERY BLADE 6.4 (BLADE) ×2 IMPLANT
ELECT REM PT RETURN 9FT ADLT (ELECTROSURGICAL) ×2
ELECTRODE REM PT RTRN 9FT ADLT (ELECTROSURGICAL) ×1 IMPLANT
GAUZE SPONGE 4X4 12PLY STRL (GAUZE/BANDAGES/DRESSINGS) ×1 IMPLANT
GLOVE BIO SURGEON STRL SZ7 (GLOVE) ×9 IMPLANT
GOWN STRL REUS W/ TWL LRG LVL3 (GOWN DISPOSABLE) ×4 IMPLANT
GOWN STRL REUS W/TWL LRG LVL3 (GOWN DISPOSABLE) ×8
HANDLE SUCTION POOLE (INSTRUMENTS) ×1 IMPLANT
LEGGING LITHOTOMY PAIR STRL (DRAPES) IMPLANT
LIGASURE IMPACT 36 18CM CVD LR (INSTRUMENTS) ×1 IMPLANT
NEEDLE HYPO 22GX1.5 SAFETY (NEEDLE) ×3 IMPLANT
NS IRRIG 1000ML POUR BTL (IV SOLUTION) ×2 IMPLANT
PACK BASIN MAJOR ARMC (MISCELLANEOUS) ×2 IMPLANT
PACK COLON CLEAN CLOSURE (MISCELLANEOUS) ×1 IMPLANT
PAD DRESSING TELFA 3X8 NADH (GAUZE/BANDAGES/DRESSINGS) ×1 IMPLANT
RELOAD PROXIMATE 75MM BLUE (ENDOMECHANICALS) ×4 IMPLANT
RELOAD STAPLE 75 3.8 BLU REG (ENDOMECHANICALS) IMPLANT
SOL PREP PVP 2OZ (MISCELLANEOUS) ×2
SOLUTION PREP PVP 2OZ (MISCELLANEOUS) ×1 IMPLANT
SPONGE LAP 18X18 RF (DISPOSABLE) ×2 IMPLANT
STAPLER PROXIMATE 75MM BLUE (STAPLE) ×1 IMPLANT
STAPLER SKIN PROX 35W (STAPLE) ×2 IMPLANT
SUCTION POOLE HANDLE (INSTRUMENTS)
SURGILUBE 2OZ TUBE FLIPTOP (MISCELLANEOUS) ×2 IMPLANT
SUT PDS PLUS 0 (SUTURE) ×4
SUT PDS PLUS AB 0 CT-2 (SUTURE) ×4 IMPLANT
SUT SILK 2 0SH CR/8 30 (SUTURE) ×2 IMPLANT
SUT VIC AB 2-0 SH 27 (SUTURE) ×2
SUT VIC AB 2-0 SH 27XBRD (SUTURE) IMPLANT
SUT VIC AB 3-0 SH 27 (SUTURE) ×2
SUT VIC AB 3-0 SH 27X BRD (SUTURE) IMPLANT
SYR 20ML LL LF (SYRINGE) ×4 IMPLANT
SYR TOOMEY 50ML (SYRINGE) ×1 IMPLANT
TRAY FOLEY MTR SLVR 16FR STAT (SET/KITS/TRAYS/PACK) ×1 IMPLANT

## 2019-10-19 NOTE — Anesthesia Postprocedure Evaluation (Signed)
Anesthesia Post Note  Patient: Julie Arias  Procedure(s) Performed: ILEOSTOMY TAKEDOWN (N/A )  Patient location during evaluation: PACU Anesthesia Type: General Level of consciousness: awake and alert and oriented Pain management: pain level controlled Vital Signs Assessment: post-procedure vital signs reviewed and stable Respiratory status: spontaneous breathing, nonlabored ventilation and respiratory function stable Cardiovascular status: blood pressure returned to baseline and stable Postop Assessment: no signs of nausea or vomiting Anesthetic complications: no     Last Vitals:  Vitals:   10/19/19 1044 10/19/19 1126  BP:    Pulse: 87   Resp: 18   Temp:  36.9 C  SpO2: 98%     Last Pain:  Vitals:   10/19/19 1126  TempSrc:   PainSc: 0-No pain                 Kaelyn Nauta

## 2019-10-19 NOTE — Anesthesia Procedure Notes (Signed)
Procedure Name: Intubation Date/Time: 10/19/2019 7:58 AM Performed by: Natasha Mead, CRNA Pre-anesthesia Checklist: Emergency Drugs available, Patient identified, Suction available, Patient being monitored and Timeout performed Patient Re-evaluated:Patient Re-evaluated prior to induction Oxygen Delivery Method: Circle system utilized Preoxygenation: Pre-oxygenation with 100% oxygen Ventilation: Oral airway inserted - appropriate to patient size Laryngoscope Size: Sabra Heck and 2 Grade View: Grade I Tube type: Oral Tube size: 7.0 mm Number of attempts: 1 Airway Equipment and Method: Rigid stylet Placement Confirmation: ETT inserted through vocal cords under direct vision,  positive ETCO2 and breath sounds checked- equal and bilateral Secured at: 20 cm Tube secured with: Tape Dental Injury: Teeth and Oropharynx as per pre-operative assessment

## 2019-10-19 NOTE — Progress Notes (Signed)
Report given to primary RN

## 2019-10-19 NOTE — Transfer of Care (Signed)
Immediate Anesthesia Transfer of Care Note  Patient: Julie Arias  Procedure(s) Performed: ILEOSTOMY TAKEDOWN (N/A )  Patient Location: PACU  Anesthesia Type:General  Level of Consciousness: awake and drowsy  Airway & Oxygen Therapy: Patient Spontanous Breathing  Post-op Assessment: Report given to RN  Post vital signs: stable  Last Vitals:  Vitals Value Taken Time  BP 146/80 10/19/19 0947  Temp    Pulse 93 10/19/19 0948  Resp 16 10/19/19 0948  SpO2 94 % 10/19/19 0948  Vitals shown include unvalidated device data.  Last Pain:  Vitals:   10/19/19 0621  TempSrc: Tympanic         Complications: No apparent anesthesia complications

## 2019-10-19 NOTE — Interval H&P Note (Signed)
History and Physical Interval Note:  10/19/2019 7:24 AM  Julie Arias  has presented today for surgery, with the diagnosis of Ileostomy.  The various methods of treatment have been discussed with the patient and family. After consideration of risks, benefits and other options for treatment, the patient has consented to  Procedure(s): ILEOSTOMY TAKEDOWN (N/A) as a surgical intervention.  The patient's history has been reviewed, patient examined, no change in status, stable for surgery.  I have reviewed the patient's chart and labs.  Questions were answered to the patient's satisfaction.     [place]

## 2019-10-19 NOTE — Anesthesia Preprocedure Evaluation (Signed)
Anesthesia Evaluation  Patient identified by MRN, date of birth, ID band Patient awake    Reviewed: Allergy & Precautions, NPO status , Patient's Chart, lab work & pertinent test results  History of Anesthesia Complications Negative for: history of anesthetic complications  Airway Mallampati: II  TM Distance: >3 FB Neck ROM: Full    Dental  (+) Upper Dentures, Lower Dentures   Pulmonary neg sleep apnea, neg COPD, former smoker,    breath sounds clear to auscultation- rhonchi (-) wheezing      Cardiovascular Exercise Tolerance: Good (-) hypertension(-) CAD, (-) Past MI, (-) Cardiac Stents and (-) CABG  Rhythm:Regular Rate:Normal - Systolic murmurs and - Diastolic murmurs    Neuro/Psych neg Seizures Anxiety negative neurological ROS     GI/Hepatic Neg liver ROS, GERD  ,  Endo/Other  neg diabetesHypothyroidism   Renal/GU Renal InsufficiencyRenal disease     Musculoskeletal negative musculoskeletal ROS (+)   Abdominal (+) - obese,   Peds  Hematology negative hematology ROS (+)   Anesthesia Other Findings Past Medical History: No date: COVID-19     Comment:  positive Jul 18, 2019 No date: Diverticulitis     Comment:  caused "perforated colon" hospitalized- no surgery No date: GERD (gastroesophageal reflux disease) 07/2019: Hematoma     Comment:  neck-right side No date: Hypothyroidism No date: Thyroid disease   Reproductive/Obstetrics                             Anesthesia Physical Anesthesia Plan  ASA: II  Anesthesia Plan: General   Post-op Pain Management:    Induction: Intravenous  PONV Risk Score and Plan: 2 and Ondansetron, Dexamethasone and Midazolam  Airway Management Planned: Oral ETT  Additional Equipment:   Intra-op Plan:   Post-operative Plan: Extubation in OR  Informed Consent: I have reviewed the patients History and Physical, chart, labs and discussed the  procedure including the risks, benefits and alternatives for the proposed anesthesia with the patient or authorized representative who has indicated his/her understanding and acceptance.     Dental advisory given  Plan Discussed with: CRNA and Anesthesiologist  Anesthesia Plan Comments:         Anesthesia Quick Evaluation

## 2019-10-19 NOTE — Op Note (Signed)
PROCEDURES: 1.  Ileostomy takedown with bowel resection and side-to-side functional end-to-end stapled anastomosis  Pre-operative Diagnosis: Ileostomy w Hx diverticulitis  Post-operative Diagnosis: Same  Surgeon: Marjory Lies Jacquette Canales   Assistants: Dr. Celine Ahr essential 40 anastomosis on to provide adequate exposure  Anesthesia: General endotracheal anesthesia  ASA Class: 2  Surgeon: Caroleen Hamman , MD FACS  Anesthesia: Gen. with endotracheal tube  Findings: Healthy small bowel.  Anastomosis with good perfusion, tension-free, widely patent lumen and no evidence of intraoperative leak  Estimated Blood Loss: 10cc           Specimens: small bowel       Complications: none               Condition: stable  Procedure Details  The patient was seen again in the Holding Room. The benefits, complications, treatment options, and expected outcomes were discussed with the patient. The risks of bleeding, infection, recurrence of symptoms, failure to resolve symptoms,  bowel injury, any of which could require further surgery were reviewed with the patient.   The patient was taken to Operating Room, identified as Julie Arias and the procedure verified.  A Time Out was held and the above information confirmed.  Prior to the induction of general anesthesia, antibiotic prophylaxis was administered. VTE prophylaxis was in place. General endotracheal anesthesia was then administered and tolerated well. After the induction, ileostomy was closed with 2 pursestring sutures. The abdomen was prepped with Chloraprep and draped in the sterile fashion. The patient was positioned in the supine position. Elliptical incision was created incorporating the ileostomy.  Electrocautery was used to dissect through subcutaneous tissue until we identified the fascia and the bowel was dissected free from adjacent fascia.  Once we have adequate exposure and good length of the bowel I created a mesenteric defect on the proximal  distal bowel and divided the small bowel in the standard fashion with a standard 75 GIA stapler.  I then proceeded to create a side-to-side functional end-to-end staple anastomosis in the standard fashion using the 75 mm load and close the common channel with a 2-0 Vicryl suture.  Inspection revealed a widely patent anastomosis with no evidence of leak and good perfusion.  I chose not to the mesentery due to there was no defect once the anastomosis was created.  Liposomal Marcaine was injected as a field block throughout the abdominal wall for postoperative analgesia. The abdominal wall layers were closed in a 2 layer fashion with 0 running PDS.  The anterior fascia was closed with a running 2-0 PDS and the subcutaneous tissue was approximated with a 3-0 Vicryl.  Skin was closed with staples.  Sterile dressing was applied  Needle and laparotomy count were correct and there were no immediate complications.  Caroleen Hamman, MD, FACS

## 2019-10-20 LAB — BASIC METABOLIC PANEL
Anion gap: 7 (ref 5–15)
BUN: 14 mg/dL (ref 8–23)
CO2: 27 mmol/L (ref 22–32)
Calcium: 8.4 mg/dL — ABNORMAL LOW (ref 8.9–10.3)
Chloride: 107 mmol/L (ref 98–111)
Creatinine, Ser: 0.94 mg/dL (ref 0.44–1.00)
GFR calc Af Amer: >60 mL/min (ref >60)
GFR calc non Af Amer: >60 mL/min (ref >60)
Glucose, Bld: 100 mg/dL — ABNORMAL HIGH (ref 70–99)
Potassium: 4.4 mmol/L (ref 3.5–5.1)
Sodium: 141 mmol/L (ref 135–145)

## 2019-10-20 LAB — CBC
HCT: 38 % (ref 36.0–46.0)
Hemoglobin: 12.4 g/dL (ref 12.0–15.0)
MCH: 30.1 pg (ref 26.0–34.0)
MCHC: 32.6 g/dL (ref 30.0–36.0)
MCV: 92.2 fL (ref 80.0–100.0)
Platelets: 163 10*3/uL (ref 150–400)
RBC: 4.12 MIL/uL (ref 3.87–5.11)
RDW: 14 % (ref 11.5–15.5)
WBC: 9.4 10*3/uL (ref 4.0–10.5)
nRBC: 0 % (ref 0.0–0.2)

## 2019-10-20 MED ORDER — HYDROMORPHONE HCL 1 MG/ML IJ SOLN: 1.0000 mg | Freq: Once | INTRAMUSCULAR | Status: DC

## 2019-10-20 MED ORDER — BISACODYL 10 MG RE SUPP
10.0000 mg | Freq: Once | RECTAL | Status: AC
  Administered 2019-10-20: 10 mg via RECTAL
  Filled 2019-10-20: qty 1

## 2019-10-20 MED ORDER — CYCLOBENZAPRINE HCL 10 MG PO TABS
10.0000 mg | ORAL_TABLET | Freq: Once | ORAL | Status: AC
  Administered 2019-10-20: 10 mg via ORAL
  Filled 2019-10-20: qty 1

## 2019-10-20 MED ORDER — HYDROMORPHONE HCL 1 MG/ML IJ SOLN
1.0000 mg | Freq: Once | INTRAMUSCULAR | Status: AC
  Administered 2019-10-20: 1 mg via INTRAVENOUS
  Filled 2019-10-20: qty 1

## 2019-10-20 MED ORDER — CYCLOBENZAPRINE HCL 10 MG PO TABS: 10.0000 mg | ORAL_TABLET | Freq: Three times a day (TID) | ORAL | Status: DC | PRN

## 2019-10-20 MED ORDER — DOCUSATE SODIUM 100 MG PO CAPS
100.0000 mg | ORAL_CAPSULE | Freq: Two times a day (BID) | ORAL | Status: DC
  Administered 2019-10-20 – 2019-10-22 (×4): 100 mg via ORAL
  Filled 2019-10-20 (×5): qty 1

## 2019-10-20 NOTE — Progress Notes (Signed)
This is a patient of Dr. Corlis Leak, seen today on rounds.  She underwent reversal of a loop ileostomy yesterday.  The procedure was uncomplicated.  She was initiated on a regular diet.  When I saw her in the morning, she was feeling well.  Her pain was well controlled, but she did feel like she was a bit gassy and constipated.  I ordered a Dulcolax suppository and started her on Colace.  She thought she might want to go home later today.  This evening, around 5:45 PM, her nurse notified me that she was having acute, 10 out of 10 pain in her lower abdomen.  She describes the pain as cramping and as bad as her pain had been when she had her acute diverticulitis episode.  Today's Vitals   10/20/19 0824 10/20/19 1319 10/20/19 1800 10/20/19 1857  BP: (!) 86/54 (!) 121/100  109/64  Pulse: 86 80  (!) 101  Resp: 16 18    Temp: 98.5 F (36.9 C) 98.3 F (36.8 C)    TempSrc: Oral Oral    SpO2: 94% 97%    Weight:      Height:      PainSc:   0-No pain    Body mass index is 22.5 kg/m. Physical Exam  Constitutional: She is oriented to person, place, and time. She appears well-developed and well-nourished.  When I saw her earlier in the day, she was in no acute distress; on reexamination at 5:45, she was curled up on her side and crying.  HENT:  Head: Normocephalic and atraumatic.  Mouth/Throat: No oropharyngeal exudate.  Eyes: Right eye exhibits no discharge. Left eye exhibits no discharge.  Neck: No tracheal deviation present.  Cardiovascular: Normal rate and regular rhythm.  Pulmonary/Chest: Effort normal. No stridor. No respiratory distress.  Abdominal: Soft. There is abdominal tenderness. There is no rebound and no guarding.  She has mild distention and is tender to palpation in both lower quadrants.  There are no peritoneal signs.  Musculoskeletal:        General: No deformity or edema.  Neurological: She is alert and oriented to person, place, and time.  Skin: Skin is warm and dry.   Psychiatric: She has a normal mood and affect.   Results for Julie, Arias (MRN [id number]) as of 10/20/2019 19:35  Ref. Range 10/20/2019 [id number]  BASIC METABOLIC PANEL Unknown Rpt (A)  Sodium Latest Ref Range: 135 - 145 mmol/L 141  Potassium Latest Ref Range: 3.5 - 5.1 mmol/L 4.4  Chloride Latest Ref Range: 98 - 111 mmol/L 107  CO2 Latest Ref Range: 22 - 32 mmol/L 27  Glucose Latest Ref Range: 70 - 99 mg/dL 100 (H)  BUN Latest Ref Range: 8 - 23 mg/dL 14  Creatinine Latest Ref Range: 0.44 - 1.00 mg/dL 0.94  Calcium Latest Ref Range: 8.9 - 10.3 mg/dL 8.4 (L)  Anion gap Latest Ref Range: 5 - 15  7  GFR, Est Non African American Latest Ref Range: >60 mL/min >60  GFR, Est African American Latest Ref Range: >60 mL/min >60  WBC Latest Ref Range: 4.0 - 10.5 K/uL 9.4  RBC Latest Ref Range: 3.87 - 5.11 MIL/uL 4.12  Hemoglobin Latest Ref Range: 12.0 - 15.0 g/dL 12.4  HCT Latest Ref Range: 36.0 - 46.0 % 38.0  MCV Latest Ref Range: 80.0 - 100.0 fL 92.2  MCH Latest Ref Range: 26.0 - 34.0 pg 30.1  MCHC Latest Ref Range: 30.0 - 36.0 g/dL 32.6  RDW Latest Ref Range:  11.5 - 15.5 % 14.0  Platelets Latest Ref Range: 150 - 400 K/uL 163  nRBC Latest Ref Range: 0.0 - 0.2 % 0.0   Impression and plan: This is a 69 year old woman who underwent reversal of the loop ileostomy yesterday.  This morning, she was doing well and we were considering discharge to home later this evening.  She did endorse feeling bloated and constipated.  A suppository as well as stool softener were ordered.  At 5:45 PM, or thereabouts, she had acute onset of cramping lower abdominal pain that she said radiated up into her neck.  This seems most consistent with muscle spasms, as she did not have any typical peritoneal signs on exam.  She had been prescribed Flexeril as well as methocarbamol, but had not received any.  We will try these medications as well as a one-time dose of additional Dilaudid, on top of what she had just received  from the nurse.  We will continue to monitor her overnight.  If she improves, we may be able to discharge her home tomorrow.

## 2019-10-21 MED ORDER — BISACODYL 10 MG RE SUPP: 10.0000 mg | Freq: Two times a day (BID) | RECTAL | Status: DC | PRN

## 2019-10-21 NOTE — Progress Notes (Signed)
This is a patient of Dr. Corlis Leak, seen today on rounds.  She underwent reversal of a loop ileostomy on Friday.  The procedure was uncomplicated.  She was initiated on a regular diet.  Yesterday, around 5:45 PM, her nurse notified me that she was having acute, 10 out of 10 pain in her lower abdomen.  She describes the pain as cramping and as bad as her pain had been when she had her acute diverticulitis episode.  This responded to additional narcotic pain medication as well as a muscle relaxant.  This morning, she states that she is still fairly sore in her lower abdomen.  The Robaxin made her very sleepy and she did not eat her breakfast because it was cold when she awoke.  She would like to see how she does with lunch before making any decision about going home.  Today's Vitals   10/20/19 2000 10/21/19 0436 10/21/19 0930 10/21/19 1315  BP:  105/67  115/62  Pulse:  97  92  Resp:  18  18  Temp:  98.9 F (37.2 C)  97.6 F (36.4 C)  TempSrc:  Oral  Oral  SpO2:  93%  97%  Weight:      Height:      PainSc: 2   3     Body mass index is 22.5 kg/m. Physical Exam  Constitutional: She is oriented to person, place, and time. She appears well-developed and well-nourished.  She still appears uncomfortable, but is in no acute distress.  HENT:  Head: Normocephalic and atraumatic.  Mouth/Throat: No oropharyngeal exudate.  Eyes: Right eye exhibits no discharge. Left eye exhibits no discharge.  Neck: No tracheal deviation present.  Cardiovascular: Normal rate and regular rhythm.  Pulmonary/Chest: Effort normal. No stridor. No respiratory distress.  Abdominal: Soft. There is abdominal tenderness. There is no rebound and no guarding.  She has mild distention and is tender to palpation in both lower quadrants.  There are no peritoneal signs.  Musculoskeletal:        General: No deformity or edema.  Neurological: She is alert and oriented to person, place, and time.  Skin: Skin is warm and dry.   Psychiatric: She has a normal mood and affect.   Results for Julie Arias, Julie Arias (MRN [id number]) as of 10/20/2019 19:35  Ref. Range 10/20/2019 [id number]  BASIC METABOLIC PANEL Unknown Rpt (A)  Sodium Latest Ref Range: 135 - 145 mmol/L 141  Potassium Latest Ref Range: 3.5 - 5.1 mmol/L 4.4  Chloride Latest Ref Range: 98 - 111 mmol/L 107  CO2 Latest Ref Range: 22 - 32 mmol/L 27  Glucose Latest Ref Range: 70 - 99 mg/dL 100 (H)  BUN Latest Ref Range: 8 - 23 mg/dL 14  Creatinine Latest Ref Range: 0.44 - 1.00 mg/dL 0.94  Calcium Latest Ref Range: 8.9 - 10.3 mg/dL 8.4 (L)  Anion gap Latest Ref Range: 5 - 15  7  GFR, Est Non African American Latest Ref Range: >60 mL/min >60  GFR, Est African American Latest Ref Range: >60 mL/min >60  WBC Latest Ref Range: 4.0 - 10.5 K/uL 9.4  RBC Latest Ref Range: 3.87 - 5.11 MIL/uL 4.12  Hemoglobin Latest Ref Range: 12.0 - 15.0 g/dL 12.4  HCT Latest Ref Range: 36.0 - 46.0 % 38.0  MCV Latest Ref Range: 80.0 - 100.0 fL 92.2  MCH Latest Ref Range: 26.0 - 34.0 pg 30.1  MCHC Latest Ref Range: 30.0 - 36.0 g/dL 32.6  RDW Latest Ref Range: 11.5 - 15.5 %  14.0  Platelets Latest Ref Range: 150 - 400 K/uL 163  nRBC Latest Ref Range: 0.0 - 0.2 % 0.0   Impression and plan: This is a 69 year old woman who underwent reversal of the loop ileostomy yesterday.  Yesterday, she was doing well in the morning, but had acute, 10 out of 10 abdominal pain in the evening.  This seems to have responded well to muscle relaxant medications.  She is still a bit sore this morning.  She would like to try lunch before making a decision about going home.  I assured her that it would be fine if she stayed another night, if she felt it was necessary.

## 2019-10-22 LAB — SURGICAL PATHOLOGY

## 2019-10-22 MED ORDER — METHOCARBAMOL 500 MG PO TABS: 500.0000 mg | ORAL_TABLET | Freq: Four times a day (QID) | ORAL | 0 refills | Status: DC | PRN

## 2019-10-22 MED ORDER — OXYCODONE HCL 5 MG PO TABS: 5.0000 mg | ORAL_TABLET | ORAL | 0 refills | Status: DC | PRN

## 2019-10-22 MED ORDER — SIMETHICONE 80 MG PO CHEW: 80.0000 mg | CHEWABLE_TABLET | Freq: Four times a day (QID) | ORAL | Status: DC | PRN

## 2019-10-22 NOTE — Discharge Summary (Addendum)
[facility] SURGICAL DISCHARGE SUMMARY   Patient ID: Julie Arias MRN: [id number] DOB/AGE: [birth year] 67 y.o.  Admit date: 10/19/2019 Discharge date: 10/22/2019  Discharge Diagnoses Patient Active Problem List   Diagnosis Date Noted   S/P closure of ileostomy 10/19/2019   Ileostomy in place [facility])    Diverticulitis large intestine 05/02/2019   Acute diverticulitis 02/04/2019   Consultants None  Procedures 10/19/2019:  1.  Ileostomy takedown with bowel resection and side-to-side functional end-to-end stapled anastomosis  HPI: Julie Arias is a 69 y.o. female with a history of diverticulitis requiring low anterior resection and diverting loop ileostomy who presents for reversal.   Hospital Course: Informed consent was obtained and documented, and patient underwent uneventful ileostomy takedown (Dr Dahlia Byes, 10/19/2019).  Post-operatively, patient had some issues with pain but this ultimately improved/resolved and advancement of patient's diet and ambulation were well-tolerated. The remainder of patient's hospital course was essentially unremarkable, and discharge planning was initiated accordingly with patient safely able to be discharged home with appropriate discharge instructions, pain control, and outpatient follow-up after all of her questions were answered to her expressed satisfaction.   Discharge Condition: Good   Physical Examination:  Constitutional: Well appearing female, NAD Pulmonary: Normal effort, no respiratory distress Gastrointestinal: Soft, non-tender, non-distended, no rebound/guarding Skin: Previous ileostomy site in the RLQ is CDI with staples, no erythema or drainage    Allergies as of 10/22/2019       Reactions   Aleve [naproxen Sodium] Rash        Medication List     STOP taking these medications    cyclobenzaprine 5 MG tablet Commonly known as: FLEXERIL   loperamide 2 MG capsule Commonly known as: IMODIUM        TAKE these medications    Euthyrox 175 MCG tablet Generic drug: levothyroxine Take 175 mcg by mouth daily before breakfast.   ibuprofen 200 MG tablet Commonly known as: ADVIL Take 400 mg by mouth 2 (two) times daily as needed for headache.   LORazepam 0.5 MG tablet Commonly known as: ATIVAN Take 0.5 mg by mouth at bedtime as needed for anxiety or sleep.   methocarbamol 500 MG tablet Commonly known as: ROBAXIN Take 1 tablet (500 mg total) by mouth every 6 (six) hours as needed for muscle spasms.   omeprazole 40 MG capsule Commonly known as: PRILOSEC Take 40 mg by mouth every evening.   ondansetron 4 MG tablet Commonly known as: Zofran Take 1 tablet (4 mg total) by mouth every 8 (eight) hours as needed for nausea or vomiting.   oxyCODONE 5 MG immediate release tablet Commonly known as: Oxy IR/ROXICODONE Take 1 tablet (5 mg total) by mouth every 4 (four) hours as needed for severe pain or breakthrough pain.   traMADol 50 MG tablet Commonly known as: ULTRAM Take 50 mg by mouth every 6 (six) hours as needed for moderate pain.   valACYclovir 1000 MG tablet Commonly known as: VALTREX Take 1 g by mouth 2 (two) times daily as needed (cold sores/fever blisters.).         Follow-up Information     Pabon, [place] F, MD. Schedule an appointment as soon as possible for a visit in 1 week(s).   Specialty: General Surgery Why: s/p ileostomy reversal, staples in place Contact information: [address] [place] [address] [phone]             Time spent on discharge management including discussion of hospital course, clinical condition, outpatient  instructions, prescriptions, and follow up with the patient and members of the medical team: >30 minutes  -- Edison Simon , PA-C [facility]  10/22/2019, 1:15 PM [phone] M-F: 7am - 4pm  I saw and evaluated the patient.  I agree with the above documentation, exam, and plan,  which I have edited where appropriate. Fredirick Maudlin  2:57 PM

## 2019-10-22 NOTE — Progress Notes (Signed)
Larrie D Reeb  A and O x 4. VSS. Pt tolerating diet well. No complaints of pain or nausea. IV removed intact, prescriptions given. Pt voiced understanding of discharge instructions with no further questions. Pt discharged home.    Allergies as of 10/22/2019      Reactions   Aleve [naproxen Sodium] Rash      Medication List    STOP taking these medications   cyclobenzaprine 5 MG tablet Commonly known as: FLEXERIL   loperamide 2 MG capsule Commonly known as: IMODIUM     TAKE these medications   Euthyrox 175 MCG tablet Generic drug: levothyroxine Take 175 mcg by mouth daily before breakfast.   ibuprofen 200 MG tablet Commonly known as: ADVIL Take 400 mg by mouth 2 (two) times daily as needed for headache.   LORazepam 0.5 MG tablet Commonly known as: ATIVAN Take 0.5 mg by mouth at bedtime as needed for anxiety or sleep.   methocarbamol 500 MG tablet Commonly known as: ROBAXIN Take 1 tablet (500 mg total) by mouth every 6 (six) hours as needed for muscle spasms.   omeprazole 40 MG capsule Commonly known as: PRILOSEC Take 40 mg by mouth every evening.   ondansetron 4 MG tablet Commonly known as: Zofran Take 1 tablet (4 mg total) by mouth every 8 (eight) hours as needed for nausea or vomiting.   oxyCODONE 5 MG immediate release tablet Commonly known as: Oxy IR/ROXICODONE Take 1 tablet (5 mg total) by mouth every 4 (four) hours as needed for severe pain or breakthrough pain.   traMADol 50 MG tablet Commonly known as: ULTRAM Take 50 mg by mouth every 6 (six) hours as needed for moderate pain.   valACYclovir 1000 MG tablet Commonly known as: VALTREX Take 1 g by mouth 2 (two) times daily as needed (cold sores/fever blisters.).       Vitals:   10/22/19 1058 10/22/19 1230  BP: (!) 144/73 (!) 143/80  Pulse: 93 94  Resp: 16   Temp: 98.8 F (37.1 C) 98.5 F (36.9 C)  SpO2: 95% 97%    Francesco Sor

## 2019-10-22 NOTE — Care Management Important Message (Signed)
Important Message  Patient Details  Name: Julie Arias MRN: [id number] Date of Birth: 02-Mar-1951   Medicare Important Message Given:  Yes     Dannette Barbara 10/22/2019, 11:49 AM

## 2019-10-24 ENCOUNTER — Encounter: Payer: Self-pay | Admitting: Surgery

## 2019-10-24 ENCOUNTER — Telehealth: Payer: Self-pay | Admitting: Surgery

## 2019-10-24 ENCOUNTER — Other Ambulatory Visit: Payer: Self-pay | Admitting: *Deleted

## 2019-10-24 ENCOUNTER — Ambulatory Visit (INDEPENDENT_AMBULATORY_CARE_PROVIDER_SITE_OTHER): Payer: Medicare HMO | Admitting: Surgery

## 2019-10-24 ENCOUNTER — Other Ambulatory Visit: Payer: Self-pay

## 2019-10-24 ENCOUNTER — Encounter: Payer: Self-pay | Admitting: *Deleted

## 2019-10-24 VITALS — BP 130/81 | HR 130 | Temp 97.2°F | Resp 15 | Ht 63.0 in | Wt 143.0 lb

## 2019-10-24 DIAGNOSIS — Z932 Ileostomy status: Secondary | ICD-10-CM

## 2019-10-24 DIAGNOSIS — Z09 Encounter for follow-up examination after completed treatment for conditions other than malignant neoplasm: Secondary | ICD-10-CM

## 2019-10-24 MED ORDER — CYCLOBENZAPRINE HCL 5 MG PO TABS: 5.0000 mg | ORAL_TABLET | Freq: Three times a day (TID) | ORAL | 0 refills | Status: AC | PRN

## 2019-10-24 MED ORDER — OXYCODONE HCL 5 MG PO TABS: 5.0000 mg | ORAL_TABLET | ORAL | 0 refills | Status: DC | PRN

## 2019-10-24 NOTE — Telephone Encounter (Signed)
Dr Hampton Abbot would like the patient to come in to be seen. She said that she is coming into the office now.

## 2019-10-24 NOTE — Patient Outreach (Signed)
[facility]) Care Management [place] Telephone Outreach Post-hospital discharge day # 2- elective hospitalization PCP office completes transition of care follow up post-hospital discharge  10/24/2019  CRISTAL QADIR March 15, 1951 [id number]  Successful telephone outreach toRosemarie "Daurice Ovando, referred to [facility] CM after EMMI red- alert received for general discharge; patient was subsequently engaged in [place] services after recenthospitalization September 30- May 10, 2019 for diverticulitis of large intestine/ colon with hemorrhage; patient had surgical colectomy with new ostomy placement. Patient was discharged home to self-care without home health services in place. Patient has history including, but not limited to, GERD; diverticulitis; hypothyroidism; and anxiety.  Unfortunately, patient experiencedhospital re-admission December 23-27, 203fr positive corona virus with complications. Patient was discharged home to self-care without home health services in place.  Noted from review of EHR that patient had recent elective hospitalization/ surgery March 19-22, 2021 for reversal of ileostomy; patient was discharged home to self-care without home health services in place  HIPAA/ identity verified with patient during phone call today.Patientreports today that she "is having terrible abdominal pain," and she confirms that she has obtained and is taking prescribed pain medications and muscle relaxants as instructed; reports pain at "10/10" and describes as cramping and severe.  States that pain medication is not helping; reports she continues to try to ambulate in her home, which is difficult given her ongoing pain post-hospital discharge.  States it is difficult for her to eat because of the pain she is experiencing.  Patient confirms that she has contacted her surgical provider this morning and is waiting for a call-back.  She sounds to be in no distress  today, however, she sounds very uncomfortable.  Patient accurately verbalizes plans to attend scheduled post-op office visit with surgical provider on Monday, 10/29/19; I encouraged her to request sooner appointment if necessary so that her pain can be addressed and she verbalizes understanding and agreement with this plan.  Patient states unable to talk any longer due to ongoing pain; we discussed general action plan for pain management and need to keep care providers updated and to return to ED if necessary.  Patient denies further issues, concerns, or problems today. Iconfirmed that patient hasmy direct phone number, the main THN CM office phone number, and the [facility] 24-hour nurse advice phone number should issues arise prior to next scheduled [facility] Community CM outreachwithin 2 weeks, after next scheduled surgical provider office visit, unless indicated sooner.  Reiterated role of THN RN CM with patient and encouraged patient to contact me directly if needs, questions, issues, or concerns arise prior to next scheduled outreach; patient agreed to do so.  Plan:  Patient will take medications as prescribed and will attend all scheduled provider appointments  Patient will promptly notify care providers for any new concerns/ issues/ problems that arise  [facility] to continue with scheduled phonecall in 2 weeks, unless indicated sooner  [facility] Problem [facility]     Most Recent Value  Care Plan Problem One  Acute post-operative pain, as evidenced by patient reporting  Role Documenting the Problem One  Care Management [place] Problem [place]  Active  THN CM Short Term Goal #1   Over the next 30 days, patient will discuss her post-operative pain with surgical care provider and will report decreased post-operative pain levels, as evidenced by patient reporting during [facility] outreach  [facility] Go[facility] #1 Start Date  10/24/19  Interventions for  Short Term Goal #1  Discussed with patient her recent elective hospitalization/ surgery for reversal of ilieostomy and her post-operative pain levels,  confirmed that she has obtained and is taking medications prescribed post-hospital discharge for pain management,  confirmed that patient is ambulating at home,  confirmed that she has contacted her surgical care provider this morning to report her pain,  encouraged patient to request urgent office visit if needed once surgical provider contacts her to address post-op pain levels  THN CM Short Term Goal #2   Over the next 30 days, patient will attend all scheduled provider appointments as evidenced by patient reporting and collaboration with care providers as indicated during [facility] RN CM outreach  [facility] CM Short Term Goal #2 Start Date  10/24/19  Interventions for Short Term Goal #2  Reviewed upcoming scheduled surgical provider office visit for next week, Monday 10/29/19 and confirmed that patient has visit on her calendar and plans to attend,  confirmed that patient has ongoing reliable transportation to scheduled provider appointments    [facility] CM Care Plan Problem Two     Most Recent Value  Care Plan Problem Two  Self- health management of chronic disease state of diverticulitis, as evidenced by patient reporting  Role Documenting the Problem Two  Care Management Coordinator  Care Plan for Problem Two  Not Active  THN Long Term Goal  Over the next 60 days, patient will verbalize plan for upcoming surgical revision of ileostomy, as evidenced by patient reporting of same   [place] Term Goal Start Date  07/30/19  [facility] Long Term Goal Met Date  09/26/19 [Goal Met]  THN CM Short Term Goal #1   Over the next 30 days, patient will promptly report any new concerns/ issues/ problems to care providers as evidenced by patient reporting and review of EMR/ collaboration with care providers as indicated during [facility] CM outreach  [facility] CM Short Term Goal #1 Start Date   09/26/19  [facility] CM Short Term Goal #1 Met Date   10/24/19 [Goal Met]  Interventions for Short Term Goal #2   Confirmed that patient has contact information for all care providers and understands to continue to notify for any new problems/ issues/ concerns  THN CM Short Term Goal #2   Over the next 30 days, patient will have elective surgery for elective reversal of ostomy as evidenced by patient reporting and review of EMR during [facility] RN CM outreach  [facility] CM Short Term Goal #2 Start Date  09/26/19  [facility] CM Short Term Goal #2 Met Date  10/24/19 [Goal Met]  Interventions for Short Term Goal #2  Confirmed that patient had successful elective reversal surgery for ileostomy,  discussed hospitalization for same and confirmed that patient has no ongoing questions around overall plan of care post-recent surgery     [name], RN, BSN, [place] Coordinator [facility] Care Management  [phone]

## 2019-10-24 NOTE — Progress Notes (Signed)
10/24/2019  HPI: Julie Arias is a 69 y.o. female s/p ileostomy takedown on 3/19 with Dr. Dahlia Byes.  She was discharged on 3/22 in good condition, after some pain issues while in the hospital.  She called the office today because she's having significant pain at the surgical site, and reports having muscle spasms.  We asked her to come in to be evaluated.  Patient reports that since the day of her surgery, she has had very significant muscle spasms.  These started after her first meal after surgery.  She denies any nausea or vomiting, fevers or chills.  Reports having loose bowel movements but is having bowel function and flatus.  She reports that after her laparoscopic sigmoidectomy in October she also had severe spasms, but they improved quicker than this time around.  Denies any issues with the incision itself.  The spasms/pain is located mostly in the bilateral lower quadrants, with right > left.  No significant pain or issues in the upper quadrants.  Vital signs: BP 130/81   Pulse (!) 130   Temp (!) 97.2 F (36.2 C)   Resp 15   Ht 5\' 3"  (1.6 m)   Wt 143 lb (64.9 kg)   SpO2 96%   BMI 25.33 kg/m    Physical Exam: Constitutional:  In some pain distress Abdomen:  Soft, non-distended, with tenderness to palpation in the lower quadrants, right worse then left.  There's no guarding.  Non-peritoneal.  RLQ incision is clean, dry, intact, with staples in place and without any evidence of infection.  Assessment/Plan: This is a 69 y.o. female s/p ileostomy takedown, with significant lower abdominal spasms after surgery.  --Discussed with patient that this could be related to spasms that she had after her last surgery.  Will order Flexeril which was the medication given to her on discharge from her previous surgery.  The spasms could be muscular, could be related to the bowels starting to wake up better but not working well in synchrony.  However, if this does not improve over the next couple of  days, I strongly urged her to call us because then we would get a CT scan abdomen/pelvis to further evaluate.  Return precautions given. --Otherwise, if she improves, she should follow up with Dr. Dahlia Byes as scheduled on 3/29.   Melvyn Neth, [facility]

## 2019-10-24 NOTE — Patient Instructions (Addendum)
We will write you a new prescription for Flexeril to help with your muscle spasms.  We will refill your Oxycodone prescription.  You may use a heating pad to the surgical area to help with comfort and healing.   Call us if you do not get more relief from this medication change, or if your symptoms worsen like nausea vomiting.   Follow up as scheduled next week.

## 2019-10-24 NOTE — Telephone Encounter (Signed)
Patient is calling and said she is in a lot of pain, she's tried everything shes been told to try, having muscle spasms, patient said her pain level is at a Ileostomy takedown 10. Patient had surgery with Dr. Dahlia Byes on the 19th and she had a Please call patient and advise.

## 2019-10-29 ENCOUNTER — Ambulatory Visit (INDEPENDENT_AMBULATORY_CARE_PROVIDER_SITE_OTHER): Payer: Self-pay | Admitting: Surgery

## 2019-10-29 ENCOUNTER — Encounter: Payer: Self-pay | Admitting: Surgery

## 2019-10-29 ENCOUNTER — Other Ambulatory Visit: Payer: Self-pay

## 2019-10-29 VITALS — BP 162/85 | HR 84 | Temp 97.7°F | Resp 12 | Ht 66.0 in | Wt 139.0 lb

## 2019-10-29 DIAGNOSIS — Z09 Encounter for follow-up examination after completed treatment for conditions other than malignant neoplasm: Secondary | ICD-10-CM

## 2019-10-29 NOTE — Patient Instructions (Signed)
Please call our office if you have questions or concerns.   

## 2019-10-31 NOTE — Progress Notes (Signed)
Julie Arias is a 69 year old well-known to me status post ileostomy takedown.  She has done well.  She did have muscle spasms in the immediate postoperative course.  Currently taking p.o. and having regular bowel movements.  No concerns at this time  PE : No acute distress.  Awake alert. Abdomen: Soft incision healing well staples removed.  No peritonitis  A/p doing very well without complications.  Return to clinic as needed

## 2019-11-01 ENCOUNTER — Other Ambulatory Visit: Payer: Medicare HMO

## 2019-11-06 ENCOUNTER — Telehealth: Payer: Self-pay | Admitting: Surgery

## 2019-11-06 MED ORDER — GABAPENTIN 300 MG PO CAPS: 300.0000 mg | ORAL_CAPSULE | Freq: Three times a day (TID) | ORAL | 0 refills | Status: DC

## 2019-11-06 NOTE — Telephone Encounter (Signed)
Patient is calling and has some questions, she is having some pain, and is asking if she needs to change what she eats or stop taken one of her medications. Please call patient and advise.

## 2019-11-06 NOTE — Telephone Encounter (Signed)
Per Dr.Pabon suggests patient may try over the counter naproxen, ice and gabapentin that we can send over to patient's pharmacy. Patient states she is having constipation as well. Patient was advised to try over the counter Magnesium Citrate or Magnesium tablets to help regulate her bowel movements. Patient states she would like to try tablet or pill form of any medications as she has a problem with liquid medications. Patient verbalized understanding and has no further questions.

## 2019-11-07 ENCOUNTER — Other Ambulatory Visit: Payer: Self-pay | Admitting: *Deleted

## 2019-11-07 ENCOUNTER — Encounter: Payer: Self-pay | Admitting: *Deleted

## 2019-11-07 NOTE — Patient Outreach (Signed)
[facility]) [place] Telephone Outreach PCP completes Transition of Care follow up post-hospital discharge Post-hospital discharge day # 16  11/07/2019  Julie Arias May 27, 1951 [id number]  Successful telephone outreach toRosemarie "Quinne Arias, referred to [facility] CM after EMMI red- alert received for general discharge; patient was subsequently engaged in [place] services after recenthospitalization September 30- May 10, 2019 for diverticulitis of large intestine/ colon with hemorrhage; patient had surgical colectomy with new ostomy placement. Patient was discharged home to self-care without home health services in place. Patient has history including, but not limited to, GERD; diverticulitis; hypothyroidism; and anxiety.  Unfortunately, patient experiencedhospital re-admission December 23-27, 2028for positive corona virus with complications. Patient was discharged home to self-care without home health services in place.  Patient had recent elective hospitalization/ surgery March 19-22, 2021 for reversal of ileostomy; patient was discharged home to self-care without home health services in place  HIPAA/ identity verified with patient during phone call today.Patientreports today ongoing post-operative (ileostomy reversal) abdominal pain- states pain "comes and goes," and confirms that today "is a good day;" denies pain today.  Confirms that she attended recent post-op surgical provider office visit 10/29/19 and discussed pain management with provider.  Confirms that she understands to contact provider for ongoing issues.  Reports provider has explained to her that "it may take awhile" before she is pain-free, as her intestines are "waking up."  Patient sounds to be in no distress throughout phone call today.  Patient further reports:  -- no concerns around medications; post-hospital discharge medications reviewed with patient today and no  concerns were identified.  Discussed options around pain management with patient and encouraged her to inquire with provider as indicated alternative options, such as use of GI anti-spasmodics; patient will consider  Medications Reviewed Today    Reviewed by Knox Royalty, RN (Registered Nurse) on 11/07/19 at [place] List Status: <None>  Medication Order Taking? Sig Documenting Provider Last Dose Status Informant  cyclobenzaprine (FLEXERIL) 5 MG tablet [id number]  Take 1 tablet (5 mg total) by mouth 3 (three) times daily as needed for muscle spasms. Olean Ree, MD  Active   EUTHYROX 175 MCG tablet [id number]  Take 175 mcg by mouth daily before breakfast.  [provider]  Active Self  gabapentin (NEURONTIN) 300 MG capsule [id number]  Take 1 capsule (300 mg total) by mouth 3 (three) times daily. Pabon, [place] F, MD  Active   ibuprofen (ADVIL) 200 MG tablet [id number]  Take 400 mg by mouth 2 (two) times daily as needed for headache. [provider]  Active Self  LORazepam (ATIVAN) 0.5 MG tablet [id number]  Take 0.5 mg by mouth at bedtime as needed for anxiety or sleep.  [provider]  Active Self  methocarbamol (ROBAXIN) 500 MG tablet [id number]  Take 1 tablet (500 mg total) by mouth every 6 (six) hours as needed for muscle spasms. Tylene Fantasia, PA-C  Active   omeprazole (PRILOSEC) 40 MG capsule [id number]  Take 40 mg by mouth every evening.  [provider]  Active Self  ondansetron (ZOFRAN) 4 MG tablet [id number]  Take 1 tablet (4 mg total) by mouth every 8 (eight) hours as needed for nausea or vomiting. Tylene Fantasia, PA-C  Active Self  oxyCODONE (OXY IR/ROXICODONE) 5 MG immediate release tablet [id number]  Take 1 tablet (5 mg total) by mouth every 4 (four) hours as needed for severe pain or breakthrough pain. Olean Ree, MD  Active  traMADol (ULTRAM) 50 MG tablet [id number]  Take 50 mg by mouth every 6 (six) hours as needed for moderate pain.  [provider]  Active Self  valACYclovir (VALTREX) 1000 MG tablet [id number]  Take 1 g by mouth 2 (two) times daily as needed (cold sores/fever blisters.).  [provider]  Active Self         -- Nutritional assessment completed; verbalizes good understanding of diet per surgeon intructions  -- patient has returned to work and continues to drive and complete ADL's/ iADL's independently: continues to deny community resource needs   Patient denies further issues, concerns, or problems today. Iconfirmed that patient hasmy direct phone number, the main THN CM office phone number, and the [facility] CM 24-hour nurse advice phone number should issues arise prior to next scheduled [facility] Community CM outreachwithin 2 weeks, after next scheduled surgical provider office visit, unless indicated sooner.  Reiterated role of THN RN CM with patient and encouraged patient to contact me directly if needs, questions, issues, or concerns arise prior to next scheduled outreach; patient agreed to do so.  Plan:  Patient will take medications as prescribed and will attend all scheduled provider appointments  Patient will promptly notify care providers for any new concerns/ issues/ problems that arise  I will share today's [facility] CM notes/ care plan/ and medication review with patient's PCP as [facility] RN CM quarterly update  [place] outreach to continue with scheduled phonecall in 2 weeks, unless indicated sooner  [facility] CM Care Plan Problem One     Most Recent Value  Care Plan Problem One  Acute post-operative pain, as evidenced by patient reporting  Role Documenting the Problem One  Care Management [place] for Problem One  Active  THN CM Short Term Goal #1   Over the next 30 days, patient will discuss her post-operative pain with surgical care provider and will report decreased post-operative pain levels, as evidenced by patient reporting during [facility] RN CM outreach  [facility] CM Short  Term Goal #1 Start Date  10/24/19  Interventions for Short Term Goal #1  Discussed with patient her current clinical condition and post-op pain management,  discussed curently prescribed medications with patient as well as other possible medication therapies for ongoing post-op pain,  encouraged patient to continue following up with surgeon post-op for any concerns around pain management; post-hospital discharge medication review completed with patient  THN CM Short Term Goal #2   Over the next 30 days, patient will attend all scheduled provider appointments as evidenced by patient reporting and collaboration with care providers as indicated during [facility] RN CM outreach  [facility] CM Short Term Goal #2 Start Date  10/24/19  Interventions for Short Term Goal #2  Reviewed with patient recent provider appointment with surgeon and confirmed that patient understands to maintain contact with provider for any new concerns/ issues/ problems that arise     [name], RN, BSN, [place] Coordinator [facility] Care Management  [phone]

## 2019-11-21 ENCOUNTER — Other Ambulatory Visit: Payer: Self-pay | Admitting: *Deleted

## 2019-11-21 ENCOUNTER — Encounter: Payer: Self-pay | Admitting: *Deleted

## 2019-11-21 NOTE — Patient Outreach (Signed)
[name]) Care Management [place] Post-(elective) hospital discharge day # 30 without unplanned hospital readmission  11/21/2019  Julie Arias 13-Mar-1951 [id number]  Successful telephone outreach toRosemarie "Lexii Arias, referred to [facility] CM after EMMI red- alert received for general discharge; patient was subsequently engaged in [place] services after recenthospitalization September 30- May 10, 2019 for diverticulitis of large intestine/ colon with hemorrhage; patient had surgical colectomy with new ostomy placement. Patient was discharged home to self-care without home health services in place. Patient has history including, but not limited to, GERD; diverticulitis; hypothyroidism; and anxiety.  Unfortunately, patient experiencedhospital re-admission December 23-27, 2048fr positive corona virus with complications. Patient was discharged home to self-care without home health services in place.  Patient had recent elective hospitalization/ surgery March 19-22, 2021 for reversal of ileostomy; patient was discharged home to self-care without home health services in place  HIPAA/ identity verified with patient during phone call today; patient reports "doing much better, feeling great today," and she denies ongoing post-operative pain; states that after our last outreach call, her pain has gradually improved; reports eating small frequent meals and taking pain medications when needed; states she has not needed to use any pain medications on a regular basis.  Patient denies new/ recent falls and she sounds to be in no distress throughout phone call today.  Discussed patient's pain management at home and confirmed that she has the contact information for her care providers; encouraged patient to promptly notify care providers for any new concerns/ issues/ problems that arise and confirmed that patient feels comfortable contacting care  providers for new concerns.  She denies recent changes to medications and continues to self-manage medications.  Reports she continues driving self to provider appointments and confirms that she has continued able to work.  Patient denies further issues, concerns, or problems today. Iconfirmed that patient hasmy direct phone number, the main THN CM office phone number, and the [facility] 24-hour nurse advice phone number should issues arise prior to next scheduled [facility] Community CM outreachnext month.  Reiterated role of THN RN CM with patient and encouraged patient to contact me directly if needs, questions, issues, or concerns arise prior to next scheduled outreach; patient agreed to do so.  Plan:  Patient will take medications as prescribed and will attend all scheduled provider appointments  Patient will promptly notify care providers for any new concerns/ issues/ problems that arise  [facility] outreach to continue with scheduled phonecall next month, unless indicated sooner  [facility] Care Plan Problem One     Most Recent Value  Care Plan Problem One  Acute post-operative pain, as evidenced by patient reporting  Role Documenting the Problem One  Care Management [place] Problem One  Active  THN Long Term Goal   Over the next 60 days, patient will contact care providers for any new issues/ problems, concerns that arise as evidenced by patient reporting and collaboration with care providers as indicated during [facility] CM outreach  Interventions for Problem One Long Term Goal  Discussed pain levels with patient and confirmed that she is not currently experiencing pain and has no current clinical concerns,  discussed pain management at home,  discussed need to schedule PCP office visit for ongoing routine care,  confirmed that patient has contact information for all care providers  [facility] Short Term Goal #1   Over the next 30 days, patient will discuss her  post-operative  pain with surgical care provider and will report decreased post-operative pain levels, as evidenced by patient reporting during [facility] RN CM outreach-- start date 11/21/19  [name] CM Short Term Goal #1 Start Date  10/24/19  [name] CM Short Term Goal #1 Met Date  11/21/19 [Goal met]  Interventions for Short Term Goal #1  Confirmed that patient has had a decreased amount of pain since last outreach,  confirmed that she has been able to manage pain at home through diet and prescribed medications,  confirmed that she has contact information for care providers and feels comfortable contacting care providers for any new concerns that arise  THN CM Short Term Goal #2   Over the next 30 days, patient will attend all scheduled provider appointments as evidenced by patient reporting and collaboration with care providers as indicated during [name] RN CM outreach  [name] CM Short Term Goal #2 Start Date  10/24/19  [name] CM Short Term Goal #2 Met Date  11/21/19 [Goal met]  Interventions for Short Term Goal #2  Confirmed that patient has attended all recent scheduled provider office visits and continues driving self to appointments,  confirmed that patient has returned to work and believes she is progressing well clincally post-operatively     Julie Rack, RN, BSN, [place] Coordinator [facility] Care Management  [phone]

## 2019-12-19 DIAGNOSIS — H524 Presbyopia: Secondary | ICD-10-CM | POA: Diagnosis not present

## 2019-12-26 ENCOUNTER — Other Ambulatory Visit: Payer: Self-pay | Admitting: *Deleted

## 2019-12-26 ENCOUNTER — Encounter: Payer: Self-pay | Admitting: *Deleted

## 2019-12-26 NOTE — Patient Outreach (Signed)
[place] [facility]) Care Management [facility] CM Telephone Outreach Post-(elective) hospital discharge day # 65 without unplanned hospital re-admission Unsuccessful (consecutive) outreach attempt- previously engaged patient  12/26/2019  [name] January 22, 1951 [id number]  10:50 am: Unsuccessful telephone outreach toRosemarie "Clarine Arias, referred to [facility] CM after EMMI red- alert received for general discharge; patient was subsequently engaged in [place] services after recenthospitalization September 30- May 10, 2019 for diverticulitis of large intestine/ colon with hemorrhage; patient had surgical colectomy with new ostomy placement. Patient was discharged home to self-care without home health services in place. Patient has history including, but not limited to, GERD; diverticulitis; hypothyroidism; and anxiety.  Unfortunately, patient experiencedhospital re-admission December 23-27, 208for positive corona virus with complications. Patient was discharged home to self-care without home health services in place.  Patient had recent elective hospitalization/ surgery March 19-22, 2021 for reversal of ileostomy; patient was discharged home to self-care without home health services in place  With call attempt today, received automated outgoing message stating that patient has a full voice mail box; unable to leave HIPAA compliant voice message, requesting call back.  Plan:  Will re-attempt THN CM telephone outreach within 4 business days if I do not hear back from patient first- per upcoming long-weekend holiday and previously stated patient preference, will schedule next call on Wednesday June 3 (patient has reported she has day off from work on Wednesday's); this is 4 business days from today  [name], [place], BSN, [employer] [facility] Care Management  [phone]

## 2020-01-02 ENCOUNTER — Other Ambulatory Visit: Payer: Self-pay | Admitting: *Deleted

## 2020-01-02 ENCOUNTER — Encounter: Payer: Self-pay | Admitting: *Deleted

## 2020-01-02 NOTE — Patient Outreach (Signed)
[place] [facility]) Care Management [place] Telephone Outreach  01/02/2020  Julie Arias February 04, 1951 [id number]  Successful telephone outreach toRosemarie "Julie Arias, referred to [facility] CM after EMMI red- alert received for general discharge; patient was subsequently engaged in [place] services after recenthospitalization September 30- May 10, 2019 for diverticulitis of large intestine/ colon with hemorrhage; patient had surgical colectomy with new ostomy placement. Patient was discharged home to self-care without home health services in place. Patient has history including, but not limited to, GERD; diverticulitis; hypothyroidism; and anxiety.  Unfortunately, patient experiencedhospital re-admission December 23-27, 201for positive corona virus with complications. Patient was discharged home to self-care without home health services in place.  Patient had recent elective hospitalization/ surgery March 19-22, 2021 for reversal of ileostomy; patient was discharged home to self-care without home health services in place  HIPAA/ identity verified with patient during phone call today; patient reports "doing great, out at the [place]," and she denies ongoing post-operative pain; she denies concerns/ new/ recent changes to medications and states she is slowly gaining weight back after recent hospitalizations. Reports eating "regular" diet and denies clinical concerns.  Sounds to be in no distress throughout phone call today.  Confirmed that patient has contact information for her care providers; again encouraged patient to promptly notify care providers for any new concerns/ issues/ problems that arise, and she verbalizes understanding and agreement.  Reports that she continues driving self, remains independent in ADL's and iADL's, and has continued to work.  Patient denies further issues, concerns, or problems today. Discussed with patient today that she has thus far made  excellent progress in meeting her previously established [facility] CM goals; discussed possibility of case closure, as patient continues to deny ongoing care coordination/ care management needs; patient would like to have one more call next month, and this was scheduled with patient today.  Discussed possibility of transfer to [place], however, patient reports she has no disease management needs.   Iconfirmed that patient hasmy direct phone number, the main THN CM office phone number, and the [facility] CM 24-hour nurse advice phone number should issues arise prior to next scheduled THN CM outreachnext month.    Plan:  Patient will take medications as prescribed and will attend all scheduled provider appointments  Patient will promptly notify care providers for any new concerns/ issues/ problems that arise  THN CM outreach to continue with scheduled phonecall next month, possibly for case closure, unless indicated sooner  [facility] CM Care Plan Problem One     Most Recent Value  Care Plan Problem One  Acute post-operative pain, as evidenced by patient reporting  Role Documenting the Problem One  Care Management [place] for Problem One  Active  THN Long Term Goal   Over the next 36 days, patient will contact care providers for any new issues/ problems, concerns that arise as evidenced by patient reporting and collaboration with care providers as indicated during [facility] RN CM outreach [Goal re-established/ extended today]  [facility] Long Term Goal Start Date  01/02/20  Interventions for Problem One Long Term Goal  Discussed current clinical condition with patient and confirmed that she is not having pain and has no current clinical concerns,  confirmed that patient understands to promptly contact care providrs for any new concerns that arise     [name], RN, BSN, [place] Coordinator [facility] Care Management  [phone]

## 2020-01-30 DIAGNOSIS — N952 Postmenopausal atrophic vaginitis: Secondary | ICD-10-CM | POA: Diagnosis not present

## 2020-01-30 DIAGNOSIS — N951 Menopausal and female climacteric states: Secondary | ICD-10-CM | POA: Diagnosis not present

## 2020-01-30 DIAGNOSIS — Z0000 Encounter for general adult medical examination without abnormal findings: Secondary | ICD-10-CM | POA: Diagnosis not present

## 2020-01-30 DIAGNOSIS — K219 Gastro-esophageal reflux disease without esophagitis: Secondary | ICD-10-CM | POA: Diagnosis not present

## 2020-01-30 DIAGNOSIS — E039 Hypothyroidism, unspecified: Secondary | ICD-10-CM | POA: Diagnosis not present

## 2020-01-30 DIAGNOSIS — M1990 Unspecified osteoarthritis, unspecified site: Secondary | ICD-10-CM | POA: Diagnosis not present

## 2020-01-30 DIAGNOSIS — M8580 Other specified disorders of bone density and structure, unspecified site: Secondary | ICD-10-CM | POA: Diagnosis not present

## 2020-01-30 DIAGNOSIS — E7800 Pure hypercholesterolemia, unspecified: Secondary | ICD-10-CM | POA: Diagnosis not present

## 2020-01-30 DIAGNOSIS — E559 Vitamin D deficiency, unspecified: Secondary | ICD-10-CM | POA: Diagnosis not present

## 2020-01-30 DIAGNOSIS — I10 Essential (primary) hypertension: Secondary | ICD-10-CM | POA: Diagnosis not present

## 2020-02-01 DIAGNOSIS — K219 Gastro-esophageal reflux disease without esophagitis: Secondary | ICD-10-CM | POA: Diagnosis not present

## 2020-02-01 DIAGNOSIS — R030 Elevated blood-pressure reading, without diagnosis of hypertension: Secondary | ICD-10-CM | POA: Diagnosis not present

## 2020-02-01 DIAGNOSIS — M1990 Unspecified osteoarthritis, unspecified site: Secondary | ICD-10-CM | POA: Diagnosis not present

## 2020-02-01 DIAGNOSIS — B009 Herpesviral infection, unspecified: Secondary | ICD-10-CM | POA: Diagnosis not present

## 2020-02-01 DIAGNOSIS — K5900 Constipation, unspecified: Secondary | ICD-10-CM | POA: Diagnosis not present

## 2020-02-01 DIAGNOSIS — E039 Hypothyroidism, unspecified: Secondary | ICD-10-CM | POA: Diagnosis not present

## 2020-02-01 DIAGNOSIS — G8929 Other chronic pain: Secondary | ICD-10-CM | POA: Diagnosis not present

## 2020-02-01 DIAGNOSIS — R112 Nausea with vomiting, unspecified: Secondary | ICD-10-CM | POA: Diagnosis not present

## 2020-02-01 DIAGNOSIS — R69 Illness, unspecified: Secondary | ICD-10-CM | POA: Diagnosis not present

## 2020-02-01 DIAGNOSIS — E663 Overweight: Secondary | ICD-10-CM | POA: Diagnosis not present

## 2020-02-06 ENCOUNTER — Other Ambulatory Visit: Payer: Self-pay | Admitting: *Deleted

## 2020-02-06 ENCOUNTER — Encounter: Payer: Self-pay | Admitting: *Deleted

## 2020-02-06 DIAGNOSIS — Z1231 Encounter for screening mammogram for malignant neoplasm of breast: Secondary | ICD-10-CM | POA: Diagnosis not present

## 2020-02-06 DIAGNOSIS — M8589 Other specified disorders of bone density and structure, multiple sites: Secondary | ICD-10-CM | POA: Diagnosis not present

## 2020-02-06 DIAGNOSIS — Z780 Asymptomatic menopausal state: Secondary | ICD-10-CM | POA: Diagnosis not present

## 2020-02-06 NOTE — Patient Outreach (Signed)
[place] [facility]) Care Management [name] CM Telephone Outreach Case Closure- patient successfully met previously established [facility] CM goals  02/06/2020  Julie Arias [birth year] [id number]  11:10 am:  Successful telephone outreach toRosemarie "Sharise Lippy, referred to [facility] CM after EMMI red- alert received for general discharge; patient was subsequently engaged in [place] services after hospitalization September 30- May 10, 2019 for diverticulitis of large intestine/ colon with hemorrhage; patient had surgical colectomy with new ostomy placement. Patient was discharged home to self-care without home health services in place. Patient has history including, but not limited to, GERD; diverticulitis; hypothyroidism; and anxiety.  Unfortunately, patient experiencedhospital re-admission December 23-27, 2065fr positive corona virus with complications. Patient was discharged home to self-care without home health services in place.  Patient had recent elective hospitalization/ surgery March 19-22, 2021 for reversal of ileostomy; patient was discharged home to self-care without home health services in place  HIPAA/ identity verified and patient shares that she is [place] at [name]; states she will return my call later today.  11:55 am: patient returned my call; HIPAA/ identity verified.  Reports she was out this morning getting her routine mammogram and bone density testing; reports doing great and sounds to be in no distress; believes she is completely healed from recent reversal of ostomy, has ongoing occasional intermittent abdominal pain that is well managed with prescribed medications.    Visited with PCP last week; reports may schedule follow up with surgical provider to evaluate reported palpable "knot" in incision area that PCP said may be internal scar tissue from healing incision- advised to consider making appointment with surgical provider if she desired to do  so.  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    Discussed with patient that she has met her previously established care coordination/ disease management goals, and she again declines chronic disease management needs, as well as ongoing care coordination needs; patient is agreeable with [facility] case closure today.   Iconfirmed that patient hasmy direct phone number, the main [facility]CM office phone number, and the T[facility]CM 24-hour nurse advice phone number should issues arise in the future and congratulated patient on the effort she put forth to meet her previously established goals.  Plan:  Will make patient inactive with THN CM, as she has successfully met her previously established [facility]CM goals and denies ongoing care coordination/ disease management needs, and will make patient's PCP aware of same- will send case closure PCP letter  It has been a pleasure caring for [name] RN, BSN, CLakeland SouthCare Management  [phone]

## 2020-03-26 DIAGNOSIS — R8290 Unspecified abnormal findings in urine: Secondary | ICD-10-CM | POA: Diagnosis not present

## 2020-03-26 DIAGNOSIS — R300 Dysuria: Secondary | ICD-10-CM | POA: Diagnosis not present

## 2020-03-26 DIAGNOSIS — E039 Hypothyroidism, unspecified: Secondary | ICD-10-CM | POA: Diagnosis not present

## 2020-03-26 DIAGNOSIS — D225 Melanocytic nevi of trunk: Secondary | ICD-10-CM | POA: Diagnosis not present

## 2020-03-26 DIAGNOSIS — L578 Other skin changes due to chronic exposure to nonionizing radiation: Secondary | ICD-10-CM | POA: Diagnosis not present

## 2020-03-26 DIAGNOSIS — L820 Inflamed seborrheic keratosis: Secondary | ICD-10-CM | POA: Diagnosis not present

## 2020-09-15 ENCOUNTER — Ambulatory Visit (INDEPENDENT_AMBULATORY_CARE_PROVIDER_SITE_OTHER): Payer: Medicare HMO

## 2020-09-15 ENCOUNTER — Encounter: Payer: Self-pay | Admitting: Podiatry

## 2020-09-15 ENCOUNTER — Other Ambulatory Visit: Payer: Self-pay | Admitting: *Deleted

## 2020-09-15 ENCOUNTER — Other Ambulatory Visit: Payer: Self-pay

## 2020-09-15 ENCOUNTER — Ambulatory Visit: Payer: Medicare HMO | Admitting: Podiatry

## 2020-09-15 DIAGNOSIS — M67471 Ganglion, right ankle and foot: Secondary | ICD-10-CM

## 2020-09-15 DIAGNOSIS — M6740 Ganglion, unspecified site: Secondary | ICD-10-CM | POA: Diagnosis not present

## 2020-09-15 NOTE — Progress Notes (Signed)
  Subjective:  Patient ID: Julie Arias, female    DOB: [birth year],  MRN: [id number]  Chief Complaint  Patient presents with  . Foot Problem    I have a spot on the top of the right foot and it has gotten bigger    70 y.o. female presents with the above complaint. History confirmed with patient. Concerned it is a ganglion cyst. Not at the same area of her previous cyst  Objective:  Physical Exam: warm, good capillary refill, no trophic changes or ulcerative lesions, normal DP and PT pulses and normal sensory exam.  Right Foot: POP dorsal midfoot right with prominent mass, not fluctuant. Negative aspiration   No images are attached to the encounter.  Radiographs: X-ray of the right foot: no fracture, dislocation, swelling or degenerative changes noted Assessment:   1. Ganglion cyst   2. Ganglion cyst of right foot    Plan:  Patient was evaluated and treated and all questions answered.  Ganglion cyst -XR reviewed -Attempted aspiration, no aspirate. Injected lesion.  -Should issue persist consider excision.  Procedure: Aspiration of ganglion Anesthesia: Lidocaine 1% plain; 35mL Instrumentation: 18g needle Technique: following sterile skin prep and anesthesia, the lesion was aspirated with 18g needle. Negative aspiration. Injected lesion with 1 cc celestone. Dressing: Dry, sterile, compression dressing. Disposition: Patient tolerated procedure well. Patient to return in 1 week for follow-up.   No follow-ups on file.

## 2020-10-06 ENCOUNTER — Other Ambulatory Visit: Payer: Self-pay

## 2020-10-06 ENCOUNTER — Ambulatory Visit (INDEPENDENT_AMBULATORY_CARE_PROVIDER_SITE_OTHER): Payer: Medicare HMO | Admitting: Podiatry

## 2020-10-06 DIAGNOSIS — M67471 Ganglion, right ankle and foot: Secondary | ICD-10-CM | POA: Diagnosis not present

## 2020-10-06 DIAGNOSIS — M79671 Pain in right foot: Secondary | ICD-10-CM

## 2020-10-06 DIAGNOSIS — M6740 Ganglion, unspecified site: Secondary | ICD-10-CM

## 2020-10-06 MED ORDER — BETAMETHASONE SOD PHOS & ACET 6 (3-3) MG/ML IJ SUSP
6.0000 mg | Freq: Once | INTRAMUSCULAR | Status: AC
  Administered 2020-10-06: 6 mg

## 2020-10-06 NOTE — Progress Notes (Signed)
  Subjective:  Patient ID: Julie Arias, female    DOB: 11-11-1950,  MRN: [id number]  No chief complaint on file.   70 y.o. female presents with the above complaint. History confirmed with patient. Concerned it is a ganglion cyst. Not at the same area of her previous cyst  Objective:  Physical Exam: warm, good capillary refill, no trophic changes or ulcerative lesions, normal DP and PT pulses and normal sensory exam.  Right Foot: POP dorsal midfoot right with prominent mass, not fluctuant. Negative aspiration   No images are attached to the encounter.  Radiographs: X-ray of the right foot: no fracture, dislocation, swelling or degenerative changes noted Assessment:   1. Ganglion cyst   2. Right foot pain    Plan:  Patient was evaluated and treated and all questions answered.  Ganglion cyst -Discussed excision given the lesion is larger. Patient would like to proceed. Discussed risks including recurrence. -Patient has failed all conservative therapy and wishes to proceed with surgical intervention. All risks, benefits, and alternatives discussed with patient. No guarantees given. Consent reviewed and signed by patient. -Planned procedures: right foot excision of mass -ASA 2 - Patient with mild systemic disease with no functional limitations -Post-op anticoagulation: chemoprophylaxis not indicated -DME dispensed for post-op use: none  Total time for visit both face-to-face and non face-to-face including patient care, review of chart/imaging, documentation: 32 mins    No follow-ups on file.

## 2020-10-16 ENCOUNTER — Telehealth: Payer: Self-pay | Admitting: Urology

## 2020-10-16 NOTE — Telephone Encounter (Signed)
DOS: 11/05/20  EXC. GANGLION/ TUMOR RIGHT --- [id number]   PER [employer] WEB SITE ([employer] INSURANCE) NO PRIOR AUTH IS REQUIRED FOR CPT CODE 71595. Transaction ID: [id number]

## 2020-11-05 ENCOUNTER — Encounter: Payer: Self-pay | Admitting: Podiatry

## 2020-11-05 ENCOUNTER — Other Ambulatory Visit: Payer: Self-pay | Admitting: Podiatry

## 2020-11-05 DIAGNOSIS — M67471 Ganglion, right ankle and foot: Secondary | ICD-10-CM | POA: Diagnosis not present

## 2020-11-05 DIAGNOSIS — M6740 Ganglion, unspecified site: Secondary | ICD-10-CM | POA: Diagnosis not present

## 2020-11-05 MED ORDER — CEPHALEXIN 500 MG PO CAPS: 500.0000 mg | ORAL_CAPSULE | Freq: Two times a day (BID) | ORAL | 0 refills | Status: DC

## 2020-11-05 MED ORDER — ONDANSETRON HCL 4 MG PO TABS: 4.0000 mg | ORAL_TABLET | Freq: Three times a day (TID) | ORAL | 0 refills | Status: DC | PRN

## 2020-11-05 MED ORDER — OXYCODONE-ACETAMINOPHEN 5-325 MG PO TABS: 1.0000 | ORAL_TABLET | ORAL | 0 refills | Status: DC | PRN

## 2020-11-10 ENCOUNTER — Other Ambulatory Visit: Payer: Self-pay

## 2020-11-10 ENCOUNTER — Ambulatory Visit (INDEPENDENT_AMBULATORY_CARE_PROVIDER_SITE_OTHER): Payer: Medicare HMO | Admitting: Podiatry

## 2020-11-10 DIAGNOSIS — M6740 Ganglion, unspecified site: Secondary | ICD-10-CM

## 2020-11-10 DIAGNOSIS — M79671 Pain in right foot: Secondary | ICD-10-CM

## 2020-11-10 DIAGNOSIS — E559 Vitamin D deficiency, unspecified: Secondary | ICD-10-CM | POA: Insufficient documentation

## 2020-11-10 DIAGNOSIS — I10 Essential (primary) hypertension: Secondary | ICD-10-CM | POA: Insufficient documentation

## 2020-11-10 DIAGNOSIS — N951 Menopausal and female climacteric states: Secondary | ICD-10-CM | POA: Insufficient documentation

## 2020-11-10 DIAGNOSIS — B001 Herpesviral vesicular dermatitis: Secondary | ICD-10-CM | POA: Insufficient documentation

## 2020-11-10 DIAGNOSIS — K5792 Diverticulitis of intestine, part unspecified, without perforation or abscess without bleeding: Secondary | ICD-10-CM | POA: Insufficient documentation

## 2020-11-10 DIAGNOSIS — Z9889 Other specified postprocedural states: Secondary | ICD-10-CM

## 2020-11-10 DIAGNOSIS — M8580 Other specified disorders of bone density and structure, unspecified site: Secondary | ICD-10-CM | POA: Insufficient documentation

## 2020-11-10 DIAGNOSIS — N952 Postmenopausal atrophic vaginitis: Secondary | ICD-10-CM | POA: Insufficient documentation

## 2020-11-10 DIAGNOSIS — G4700 Insomnia, unspecified: Secondary | ICD-10-CM | POA: Insufficient documentation

## 2020-11-10 DIAGNOSIS — E7800 Pure hypercholesterolemia, unspecified: Secondary | ICD-10-CM | POA: Insufficient documentation

## 2020-11-10 DIAGNOSIS — M1990 Unspecified osteoarthritis, unspecified site: Secondary | ICD-10-CM | POA: Insufficient documentation

## 2020-11-10 NOTE — Progress Notes (Signed)
  Subjective:  Patient ID: Julie Arias, female    DOB: [birth year],  MRN: [id number]  Chief Complaint  Patient presents with  . Routine Post Op    POV#1 -pt denies N/V/F/Ch -pt states," overall doing good, not much pain; 3/10 - w/ a pulling sensation and itching -Tx: sx shoe, oxycodone, advil, icing and elevation -pt states she had to re-wrapped ace bandage but left inner bandage intact    DOS: 11/05/20 Procedure: Excision of cyst right foot  70 y.o. female presents with the above complaint. History confirmed with patient.  Objective:  Physical Exam: tenderness at the surgical site, no edema noted and calf supple, nontender. Incision: healing well, no significant drainage, no dehiscence, no significant erythema. Steri strips intact.  Assessment:   1. Ganglion cyst   2. Right foot pain   3. Post-operative state     Plan:  Patient was evaluated and treated and all questions answered.  Post-operative State -Ok to start showering at this time. Advised they cannot soak. -Ok to weightbear in sandals and other shoe gear that do not stress incision  Return in about 2 weeks (around 11/24/2020) for Post-Op (No XRs).

## 2020-11-19 ENCOUNTER — Telehealth: Payer: Self-pay | Admitting: Podiatry

## 2020-11-19 NOTE — Telephone Encounter (Signed)
Pt states she has redness & clear drainage from cyst removal.  Was unable to wear compression stocking.  She has been cleaning with peroxide & finished antibiotic.  Has appt Monday with you.  Does she need another antibiotic & is there anything else she needs top be doing.   [facility] [place]

## 2020-11-24 ENCOUNTER — Ambulatory Visit (INDEPENDENT_AMBULATORY_CARE_PROVIDER_SITE_OTHER): Payer: Medicare HMO | Admitting: Podiatry

## 2020-11-24 ENCOUNTER — Other Ambulatory Visit: Payer: Self-pay

## 2020-11-24 DIAGNOSIS — M6740 Ganglion, unspecified site: Secondary | ICD-10-CM

## 2020-11-24 NOTE — Progress Notes (Signed)
  Subjective:  Patient ID: Julie Arias, female    DOB: 06-Jun-1951,  MRN: [id number]  Chief Complaint  Patient presents with  . Routine Post Op    POV #2 DOS 11/05/20 RIGHT FOOT EXC OF SOFT TISSUE MASS    DOS: 11/05/20 Procedure: Excision of cyst right foot  70 y.o. female presents with the above complaint. History confirmed with patient. Admits to soaking her foot and going in the ocean. Has some pain at the inside of her foot.  Objective:  Physical Exam: tenderness at the surgical site, no edema noted and calf supple, nontender. No palpable mass. Incision: healing well, slight gapping proximally.  Assessment:   1. Ganglion cyst    Plan:  Patient was evaluated and treated and all questions answered.  Post-operative State -Wound is healing well, perhaps slightly slower 2/2 patient soaking and weakening sutures. I advised she can get it wet but not soak it. Steri-strips were applied to reinforce.  Return in about 2 weeks (around 12/08/2020) for Post-Op (No XRs).

## 2020-11-25 NOTE — Telephone Encounter (Signed)
Patient seen/issues addressed.

## 2020-12-08 ENCOUNTER — Ambulatory Visit (INDEPENDENT_AMBULATORY_CARE_PROVIDER_SITE_OTHER): Payer: Medicare HMO | Admitting: Podiatry

## 2020-12-08 ENCOUNTER — Other Ambulatory Visit: Payer: Self-pay

## 2020-12-08 DIAGNOSIS — Z9889 Other specified postprocedural states: Secondary | ICD-10-CM

## 2020-12-08 DIAGNOSIS — M6740 Ganglion, unspecified site: Secondary | ICD-10-CM

## 2020-12-08 DIAGNOSIS — M79671 Pain in right foot: Secondary | ICD-10-CM

## 2020-12-08 NOTE — Progress Notes (Signed)
  Subjective:  Patient ID: Julie Arias, female    DOB: 03/09/1951,  MRN: [id number]  Chief Complaint  Patient presents with  . Routine Post Op    POV#3 -pt denies N/V/f/Ch -pt states," lots of improvement." - tender to touch -little redness but no swelling -w/ itching Tx: vaseline   DOS: 11/05/20 Procedure: Excision of cyst right foot  70 y.o. female presents with the above complaint. History confirmed with patient. Admits to soaking her foot and going in the ocean. Has some pain at the inside of her foot.  Objective:  Physical Exam: tenderness at the surgical site, no edema noted and calf supple, nontender. No palpable mass. Incision: healing well, slight gapping proximally.  Assessment:   1. Ganglion cyst   2. Right foot pain   3. Post-operative state    Plan:  Patient was evaluated and treated and all questions answered.  Post-operative State -Wound is now healed. Pain is decreased. She is walking in normal shoes only has issues when the incision is directly touched. Will allow f/u only PRN  Return if symptoms worsen or fail to improve.

## 2021-01-30 DIAGNOSIS — Z0000 Encounter for general adult medical examination without abnormal findings: Secondary | ICD-10-CM | POA: Diagnosis not present

## 2021-01-30 DIAGNOSIS — E7800 Pure hypercholesterolemia, unspecified: Secondary | ICD-10-CM | POA: Diagnosis not present

## 2021-01-30 DIAGNOSIS — E559 Vitamin D deficiency, unspecified: Secondary | ICD-10-CM | POA: Diagnosis not present

## 2021-01-30 DIAGNOSIS — N951 Menopausal and female climacteric states: Secondary | ICD-10-CM | POA: Diagnosis not present

## 2021-01-30 DIAGNOSIS — M1990 Unspecified osteoarthritis, unspecified site: Secondary | ICD-10-CM | POA: Diagnosis not present

## 2021-01-30 DIAGNOSIS — I10 Essential (primary) hypertension: Secondary | ICD-10-CM | POA: Diagnosis not present

## 2021-01-30 DIAGNOSIS — K219 Gastro-esophageal reflux disease without esophagitis: Secondary | ICD-10-CM | POA: Diagnosis not present

## 2021-01-30 DIAGNOSIS — M8580 Other specified disorders of bone density and structure, unspecified site: Secondary | ICD-10-CM | POA: Diagnosis not present

## 2021-01-30 DIAGNOSIS — E039 Hypothyroidism, unspecified: Secondary | ICD-10-CM | POA: Diagnosis not present

## 2021-01-30 DIAGNOSIS — N952 Postmenopausal atrophic vaginitis: Secondary | ICD-10-CM | POA: Diagnosis not present

## 2021-02-11 DIAGNOSIS — Z1231 Encounter for screening mammogram for malignant neoplasm of breast: Secondary | ICD-10-CM | POA: Diagnosis not present

## 2021-02-18 DIAGNOSIS — H524 Presbyopia: Secondary | ICD-10-CM | POA: Diagnosis not present

## 2021-04-08 DIAGNOSIS — E039 Hypothyroidism, unspecified: Secondary | ICD-10-CM | POA: Diagnosis not present

## 2021-05-19 DIAGNOSIS — B009 Herpesviral infection, unspecified: Secondary | ICD-10-CM | POA: Diagnosis not present

## 2021-05-19 DIAGNOSIS — R69 Illness, unspecified: Secondary | ICD-10-CM | POA: Diagnosis not present

## 2021-05-19 DIAGNOSIS — M1990 Unspecified osteoarthritis, unspecified site: Secondary | ICD-10-CM | POA: Diagnosis not present

## 2021-05-19 DIAGNOSIS — Z008 Encounter for other general examination: Secondary | ICD-10-CM | POA: Diagnosis not present

## 2021-05-19 DIAGNOSIS — E785 Hyperlipidemia, unspecified: Secondary | ICD-10-CM | POA: Diagnosis not present

## 2021-05-19 DIAGNOSIS — K219 Gastro-esophageal reflux disease without esophagitis: Secondary | ICD-10-CM | POA: Diagnosis not present

## 2021-05-19 DIAGNOSIS — I739 Peripheral vascular disease, unspecified: Secondary | ICD-10-CM | POA: Diagnosis not present

## 2021-05-19 DIAGNOSIS — Z87891 Personal history of nicotine dependence: Secondary | ICD-10-CM | POA: Diagnosis not present

## 2021-05-19 DIAGNOSIS — I10 Essential (primary) hypertension: Secondary | ICD-10-CM | POA: Diagnosis not present

## 2021-05-19 DIAGNOSIS — F419 Anxiety disorder, unspecified: Secondary | ICD-10-CM | POA: Diagnosis not present

## 2021-05-19 DIAGNOSIS — E890 Postprocedural hypothyroidism: Secondary | ICD-10-CM | POA: Diagnosis not present

## 2021-05-19 DIAGNOSIS — M8580 Other specified disorders of bone density and structure, unspecified site: Secondary | ICD-10-CM | POA: Diagnosis not present

## 2021-05-19 DIAGNOSIS — G8929 Other chronic pain: Secondary | ICD-10-CM | POA: Diagnosis not present

## 2021-05-19 DIAGNOSIS — K08109 Complete loss of teeth, unspecified cause, unspecified class: Secondary | ICD-10-CM | POA: Diagnosis not present

## 2021-05-21 DIAGNOSIS — M7989 Other specified soft tissue disorders: Secondary | ICD-10-CM | POA: Diagnosis not present

## 2021-05-22 ENCOUNTER — Other Ambulatory Visit (HOSPITAL_COMMUNITY): Payer: Self-pay | Admitting: Internal Medicine

## 2021-05-22 ENCOUNTER — Ambulatory Visit (HOSPITAL_COMMUNITY)
Admission: RE | Admit: 2021-05-22 | Discharge: 2021-05-22 | Disposition: A | Discharge status: Home / Self Care | Payer: Medicare HMO | Source: Ambulatory Visit | Attending: Internal Medicine | Admitting: Internal Medicine

## 2021-05-22 ENCOUNTER — Other Ambulatory Visit (HOSPITAL_COMMUNITY): Payer: Self-pay | Admitting: Family Medicine

## 2021-05-22 ENCOUNTER — Other Ambulatory Visit: Payer: Self-pay

## 2021-05-22 DIAGNOSIS — R609 Edema, unspecified: Secondary | ICD-10-CM

## 2021-05-22 NOTE — Progress Notes (Signed)
Left lower extremity venous duplex completed. Refer to "CV Proc" under chart review to view preliminary results.  05/22/2021 8:30 AM Kelby Aline., MHA, RVT, RDCS, RDMS

## 2021-06-02 ENCOUNTER — Other Ambulatory Visit: Payer: Self-pay | Admitting: Family Medicine

## 2021-06-02 DIAGNOSIS — R6889 Other general symptoms and signs: Secondary | ICD-10-CM

## 2021-06-17 ENCOUNTER — Other Ambulatory Visit: Payer: Medicare HMO

## 2021-07-01 ENCOUNTER — Ambulatory Visit
Admission: RE | Admit: 2021-07-01 | Discharge: 2021-07-01 | Disposition: A | Discharge status: Home / Self Care | Payer: Medicare HMO | Source: Ambulatory Visit | Attending: Family Medicine | Admitting: Family Medicine

## 2021-07-01 DIAGNOSIS — R9430 Abnormal result of cardiovascular function study, unspecified: Secondary | ICD-10-CM | POA: Diagnosis not present

## 2021-07-01 DIAGNOSIS — R6889 Other general symptoms and signs: Secondary | ICD-10-CM

## 2021-07-15 ENCOUNTER — Encounter: Payer: Self-pay | Admitting: Vascular Surgery

## 2021-07-15 ENCOUNTER — Other Ambulatory Visit: Payer: Self-pay

## 2021-07-15 ENCOUNTER — Ambulatory Visit: Payer: Medicare HMO | Admitting: Vascular Surgery

## 2021-07-15 VITALS — BP 136/75 | HR 79 | Temp 98.1°F | Resp 20 | Ht 66.0 in | Wt 147.0 lb

## 2021-07-15 DIAGNOSIS — R252 Cramp and spasm: Secondary | ICD-10-CM | POA: Diagnosis not present

## 2021-07-15 DIAGNOSIS — R609 Edema, unspecified: Secondary | ICD-10-CM | POA: Diagnosis not present

## 2021-07-15 NOTE — Progress Notes (Signed)
Patient ID: Julie Arias, female   DOB: Mar 07, 1951, 70 y.o.   MRN: [id number]  Reason for Consult: No chief complaint on file.   Referred by Mayra Neer, MD  Subjective:     HPI:  Julie Arias is a 70 y.o. female without significant history of vascular disease.  She did have COVID 2 years ago at the time of Christmas.  She does not take any blood thinners.  She is never had any DVTs.  She has no personal or family history of stroke or coronary artery disease and no family or personal history of aneurysm disease.  She does have significant cramps in her feet and more in her right leg which affect her in her sleep and during her wake times as well.  This has been present for many years.  In the past she did take quinine which she thought was helpful but this is no longer commercially available for her.  She also recently took a trip out [place] on a motorcycle and then had swelling of her left leg which has been quite uncomfortable for her but has a negative DVT study.  She had never noticed swelling before this she did not have any injuries to the left leg.  She does not wear compression socks states that she does not like to wear socks at all.  She has no tissue loss or ulceration.  She did have surgery to remove a cyst on her right foot which has been somewhat slow to heal but there is not currently a wound.  Past Medical History:  Diagnosis Date   COVID-19    positive Jul 18, 2019   Diverticulitis    caused "perforated colon" hospitalized- no surgery   GERD (gastroesophageal reflux disease)    Hematoma 07/2019   neck-right side   Hypothyroidism    Thyroid disease    Family History  Problem Relation Age of Onset   Lung disease Father    Lung cancer Brother    Past Surgical History:  Procedure Laterality Date   ABDOMINAL HYSTERECTOMY     COLON RESECTION N/A 05/04/2019   Procedure: LAPAROSCOPIC SIGMOID COLON RESECTION;  Surgeon: Jules Husbands, MD;  Location: [facility] ORS;   Service: General;  Laterality: N/A;   COLONOSCOPY W/ POLYPECTOMY     COLONOSCOPY WITH PROPOFOL N/A 07/07/2015   Procedure: COLONOSCOPY WITH PROPOFOL;  Surgeon: Garlan Fair, MD;  Location: [facility];  Service: Endoscopy;  Laterality: N/A;   COLONOSCOPY WITH PROPOFOL N/A 04/06/2019   Procedure: COLONOSCOPY WITH PROPOFOL;  Surgeon: Jonathon Bellows, MD;  Location: [facility] ENDOSCOPY;  Service: Gastroenterology;  Laterality: N/A;   GANGLION CYST EXCISION Right 12/2018   foot   ILEOSTOMY CLOSURE N/A 10/19/2019   Procedure: ILEOSTOMY TAKEDOWN;  Surgeon: Jules Husbands, MD;  Location: [facility] ORS;  Service: General;  Laterality: N/A;    Short Social History:  Social History   Tobacco Use   Smoking status: Former    Packs/day: 1.00    Years: 25.00    Pack years: 25.00    Types: Cigarettes    Quit date: 01/03/2004    Years since quitting: 17.5   Smokeless tobacco: Never  Substance Use Topics   Alcohol use: No    Allergies  Allergen Reactions   Aleve [Naproxen] Other (See Comments)   Effexor Xr [Venlafaxine] Other (See Comments)   Aleve [Naproxen Sodium] Rash    Current Outpatient Medications  Medication Sig Dispense Refill   cephALEXin (KEFLEX) 500 MG  capsule Take 1 capsule (500 mg total) by mouth 2 (two) times daily. 14 capsule 0   ciprofloxacin (CIPRO) 250 MG tablet Take 250 mg by mouth 2 (two) times daily.     cyclobenzaprine (FLEXERIL) 5 MG tablet Take 1 tablet (5 mg total) by mouth 3 (three) times daily as needed for muscle spasms. 30 tablet 0   EUTHYROX 175 MCG tablet Take 175 mcg by mouth daily before breakfast.      EUTHYROX 200 MCG tablet Take 200 mcg by mouth every morning.     gabapentin (NEURONTIN) 300 MG capsule Take 1 capsule (300 mg total) by mouth 3 (three) times daily. 30 capsule 0   ibuprofen (ADVIL) 200 MG tablet Take 400 mg by mouth 2 (two) times daily as needed for headache.     LORazepam (ATIVAN) 0.5 MG tablet Take 0.5 mg by mouth at bedtime as needed for anxiety or  sleep.      meclizine (ANTIVERT) 25 MG tablet      methocarbamol (ROBAXIN) 500 MG tablet Take 1 tablet (500 mg total) by mouth every 6 (six) hours as needed for muscle spasms. 30 tablet 0   omeprazole (PRILOSEC) 40 MG capsule Take 40 mg by mouth every evening.      ondansetron (ZOFRAN) 4 MG tablet Take 1 tablet (4 mg total) by mouth every 8 (eight) hours as needed for nausea or vomiting. 20 tablet 0   oxyCODONE (OXY IR/ROXICODONE) 5 MG immediate release tablet Take 1 tablet (5 mg total) by mouth every 4 (four) hours as needed for severe pain or breakthrough pain. 30 tablet 0   oxyCODONE-acetaminophen (PERCOCET) 5-325 MG tablet Take 1 tablet by mouth every 4 (four) hours as needed for severe pain. 20 tablet 0   traMADol (ULTRAM) 50 MG tablet Take 50 mg by mouth every 6 (six) hours as needed for moderate pain.      valACYclovir (VALTREX) 1000 MG tablet Take 1 g by mouth 2 (two) times daily as needed (cold sores/fever blisters.).      No current facility-administered medications for this visit.    Review of Systems  Constitutional:  Constitutional negative. HENT: HENT negative.  Eyes: Eyes negative.  Respiratory: Respiratory negative.  Cardiovascular: Positive for leg swelling.  GI: Gastrointestinal negative.  Musculoskeletal:       Foot and leg cramps Skin: Skin negative.  Neurological: Neurological negative. Hematologic: Hematologic/lymphatic negative.  Psychiatric: Psychiatric negative.       Objective:  Objective   Vitals:   07/15/21 1120  BP: 136/75  Pulse: 79  Resp: 20  Temp: 98.1 F (36.7 C)  SpO2: 94%     Physical Exam HENT:     Head: Normocephalic.     Nose:     Comments: Wearing a mask Eyes:     Pupils: Pupils are equal, round, and reactive to light.  Cardiovascular:     Rate and Rhythm: Normal rate.     Pulses:          Posterior tibial pulses are 2+ on the right side and 2+ on the left side.  Pulmonary:     Effort: Pulmonary effort is normal.   Abdominal:     General: Abdomen is flat.     Palpations: Abdomen is soft.  Musculoskeletal:        General: Normal range of motion.     Right lower leg: No edema.     Left lower leg: No edema.  Skin:    General: Skin is warm.  Capillary Refill: Capillary refill takes less than 2 seconds.  Neurological:     General: No focal deficit present.     Mental Status: She is alert.  Psychiatric:        Mood and Affect: Mood normal.        Behavior: Behavior normal.        Thought Content: Thought content normal.    Data: Right Lower Extremity   Resting ABI:  1.05   Post exercise ABI: 1.09   Resting TBI: 0.35   Great toe pressure: 59 mm Hg   Segmental Pressures: Normal segmental pressures without significant pressure gradient between adjacent segments.   PVRs: Normal PVRs with maintained waveform amplitude, calf augmentation, and quality. Poor PPG waveform at the toes.   Arterial Waveforms: Normal biphasic/tri-phasic arterial waveforms.   Left Lower Extremity:   Resting ABI:  1.15   Post exercise ABI: 1.17   Resting TBI: 0.42   Grest toe pressure: 70 mmHg   Segmental Pressures: Normal segmental pressures without significant pressure gradient between adjacent segments.   PVRs: Normal PVRs with maintained waveform amplitude, calf augmentation, and quality. Poor PPG waveform at the toes.   Arterial Waveforms: Normal biphasic/tri-phasic arterial waveforms.   Other: Symmetric upper extremity pressures.   IMPRESSION: 1. Normal bilateral lower extremity ABI at rest and following exercise as detailed. 2. Normal segmental pressures and arterial waveforms of the bilateral lower extremities to the level of the ankles. 3. The toe brachial index is moderately abnormal bilaterally, with poor PPG waveforms of the toes. These findings suggest microvascular disease below the level of the ankle.     Assessment/Plan:     70 year old female with bilateral lower extremity  leg cramping.  This does not appear to be vascular in nature.  I recommended knee-high compression stockings for the left lower extremity swelling with negative DVT study previously.  She can follow-up with me on an as-needed basis.     Waynetta Sandy MD [facility]

## 2021-08-26 DIAGNOSIS — L814 Other melanin hyperpigmentation: Secondary | ICD-10-CM | POA: Diagnosis not present

## 2021-08-26 DIAGNOSIS — L578 Other skin changes due to chronic exposure to nonionizing radiation: Secondary | ICD-10-CM | POA: Diagnosis not present

## 2021-08-26 DIAGNOSIS — D485 Neoplasm of uncertain behavior of skin: Secondary | ICD-10-CM | POA: Diagnosis not present

## 2021-08-26 DIAGNOSIS — D225 Melanocytic nevi of trunk: Secondary | ICD-10-CM | POA: Diagnosis not present

## 2021-08-26 DIAGNOSIS — L820 Inflamed seborrheic keratosis: Secondary | ICD-10-CM | POA: Diagnosis not present

## 2021-09-02 DIAGNOSIS — G8929 Other chronic pain: Secondary | ICD-10-CM | POA: Diagnosis not present

## 2021-09-02 DIAGNOSIS — H2513 Age-related nuclear cataract, bilateral: Secondary | ICD-10-CM | POA: Diagnosis not present

## 2021-09-02 DIAGNOSIS — M25512 Pain in left shoulder: Secondary | ICD-10-CM | POA: Diagnosis not present

## 2021-09-02 DIAGNOSIS — H43822 Vitreomacular adhesion, left eye: Secondary | ICD-10-CM | POA: Diagnosis not present

## 2021-11-25 DIAGNOSIS — H2511 Age-related nuclear cataract, right eye: Secondary | ICD-10-CM | POA: Diagnosis not present

## 2022-01-11 DIAGNOSIS — H269 Unspecified cataract: Secondary | ICD-10-CM | POA: Diagnosis not present

## 2022-01-11 DIAGNOSIS — H2511 Age-related nuclear cataract, right eye: Secondary | ICD-10-CM | POA: Diagnosis not present

## 2022-01-11 DIAGNOSIS — Z01818 Encounter for other preprocedural examination: Secondary | ICD-10-CM | POA: Diagnosis not present

## 2022-02-10 DIAGNOSIS — M7500 Adhesive capsulitis of unspecified shoulder: Secondary | ICD-10-CM | POA: Diagnosis not present

## 2022-02-10 DIAGNOSIS — E039 Hypothyroidism, unspecified: Secondary | ICD-10-CM | POA: Diagnosis not present

## 2022-02-10 DIAGNOSIS — E7800 Pure hypercholesterolemia, unspecified: Secondary | ICD-10-CM | POA: Diagnosis not present

## 2022-02-10 DIAGNOSIS — M1990 Unspecified osteoarthritis, unspecified site: Secondary | ICD-10-CM | POA: Diagnosis not present

## 2022-02-10 DIAGNOSIS — G4700 Insomnia, unspecified: Secondary | ICD-10-CM | POA: Diagnosis not present

## 2022-02-10 DIAGNOSIS — E559 Vitamin D deficiency, unspecified: Secondary | ICD-10-CM | POA: Diagnosis not present

## 2022-02-10 DIAGNOSIS — I10 Essential (primary) hypertension: Secondary | ICD-10-CM | POA: Diagnosis not present

## 2022-02-10 DIAGNOSIS — K219 Gastro-esophageal reflux disease without esophagitis: Secondary | ICD-10-CM | POA: Diagnosis not present

## 2022-02-10 DIAGNOSIS — Z0000 Encounter for general adult medical examination without abnormal findings: Secondary | ICD-10-CM | POA: Diagnosis not present

## 2022-02-10 DIAGNOSIS — N952 Postmenopausal atrophic vaginitis: Secondary | ICD-10-CM | POA: Diagnosis not present

## 2022-02-10 DIAGNOSIS — M8580 Other specified disorders of bone density and structure, unspecified site: Secondary | ICD-10-CM | POA: Diagnosis not present

## 2022-02-10 DIAGNOSIS — K068 Other specified disorders of gingiva and edentulous alveolar ridge: Secondary | ICD-10-CM | POA: Diagnosis not present

## 2022-03-10 DIAGNOSIS — M8588 Other specified disorders of bone density and structure, other site: Secondary | ICD-10-CM | POA: Diagnosis not present

## 2022-03-10 DIAGNOSIS — Z1231 Encounter for screening mammogram for malignant neoplasm of breast: Secondary | ICD-10-CM | POA: Diagnosis not present

## 2022-03-10 DIAGNOSIS — Z780 Asymptomatic menopausal state: Secondary | ICD-10-CM | POA: Diagnosis not present

## 2022-07-07 DIAGNOSIS — Z87891 Personal history of nicotine dependence: Secondary | ICD-10-CM | POA: Diagnosis not present

## 2022-07-07 DIAGNOSIS — I739 Peripheral vascular disease, unspecified: Secondary | ICD-10-CM | POA: Diagnosis not present

## 2022-07-07 DIAGNOSIS — Z008 Encounter for other general examination: Secondary | ICD-10-CM | POA: Diagnosis not present

## 2022-07-07 DIAGNOSIS — R42 Dizziness and giddiness: Secondary | ICD-10-CM | POA: Diagnosis not present

## 2022-07-07 DIAGNOSIS — Z825 Family history of asthma and other chronic lower respiratory diseases: Secondary | ICD-10-CM | POA: Diagnosis not present

## 2022-07-07 DIAGNOSIS — K219 Gastro-esophageal reflux disease without esophagitis: Secondary | ICD-10-CM | POA: Diagnosis not present

## 2022-07-07 DIAGNOSIS — E039 Hypothyroidism, unspecified: Secondary | ICD-10-CM | POA: Diagnosis not present

## 2022-07-07 DIAGNOSIS — M1990 Unspecified osteoarthritis, unspecified site: Secondary | ICD-10-CM | POA: Diagnosis not present

## 2022-07-07 DIAGNOSIS — Z886 Allergy status to analgesic agent status: Secondary | ICD-10-CM | POA: Diagnosis not present

## 2022-07-07 DIAGNOSIS — Z811 Family history of alcohol abuse and dependence: Secondary | ICD-10-CM | POA: Diagnosis not present

## 2022-07-07 DIAGNOSIS — Z809 Family history of malignant neoplasm, unspecified: Secondary | ICD-10-CM | POA: Diagnosis not present

## 2022-07-07 DIAGNOSIS — R69 Illness, unspecified: Secondary | ICD-10-CM | POA: Diagnosis not present

## 2022-07-07 DIAGNOSIS — B009 Herpesviral infection, unspecified: Secondary | ICD-10-CM | POA: Diagnosis not present

## 2022-09-01 DIAGNOSIS — L814 Other melanin hyperpigmentation: Secondary | ICD-10-CM | POA: Diagnosis not present

## 2022-09-01 DIAGNOSIS — D225 Melanocytic nevi of trunk: Secondary | ICD-10-CM | POA: Diagnosis not present

## 2022-09-01 DIAGNOSIS — R233 Spontaneous ecchymoses: Secondary | ICD-10-CM | POA: Diagnosis not present

## 2022-09-01 DIAGNOSIS — L820 Inflamed seborrheic keratosis: Secondary | ICD-10-CM | POA: Diagnosis not present

## 2022-09-09 DIAGNOSIS — B029 Zoster without complications: Secondary | ICD-10-CM | POA: Diagnosis not present

## 2022-09-09 DIAGNOSIS — M1990 Unspecified osteoarthritis, unspecified site: Secondary | ICD-10-CM | POA: Diagnosis not present

## 2022-09-22 DIAGNOSIS — M79644 Pain in right finger(s): Secondary | ICD-10-CM | POA: Diagnosis not present

## 2022-09-22 DIAGNOSIS — M1811 Unilateral primary osteoarthritis of first carpometacarpal joint, right hand: Secondary | ICD-10-CM | POA: Diagnosis not present

## 2023-01-19 DIAGNOSIS — R6884 Jaw pain: Secondary | ICD-10-CM | POA: Diagnosis not present

## 2023-01-27 ENCOUNTER — Other Ambulatory Visit: Payer: Self-pay | Admitting: Family Medicine

## 2023-01-27 ENCOUNTER — Ambulatory Visit
Admission: RE | Admit: 2023-01-27 | Discharge: 2023-01-27 | Disposition: A | Discharge status: Home / Self Care | Payer: Medicare HMO | Source: Ambulatory Visit | Attending: Family Medicine | Admitting: Family Medicine

## 2023-01-27 DIAGNOSIS — R6884 Jaw pain: Secondary | ICD-10-CM

## 2023-01-27 MED ORDER — IOPAMIDOL (ISOVUE-300) INJECTION 61%
75.0000 mL | Freq: Once | INTRAVENOUS | Status: AC | PRN
  Administered 2023-01-27: 75 mL via INTRAVENOUS

## 2023-01-28 DIAGNOSIS — K146 Glossodynia: Secondary | ICD-10-CM | POA: Diagnosis not present

## 2023-01-28 DIAGNOSIS — E559 Vitamin D deficiency, unspecified: Secondary | ICD-10-CM | POA: Diagnosis not present

## 2023-01-28 DIAGNOSIS — E039 Hypothyroidism, unspecified: Secondary | ICD-10-CM | POA: Diagnosis not present

## 2023-02-02 DIAGNOSIS — E538 Deficiency of other specified B group vitamins: Secondary | ICD-10-CM | POA: Diagnosis not present

## 2023-02-14 ENCOUNTER — Ambulatory Visit (INDEPENDENT_AMBULATORY_CARE_PROVIDER_SITE_OTHER): Payer: Medicare HMO | Admitting: Otolaryngology

## 2023-02-14 ENCOUNTER — Encounter (INDEPENDENT_AMBULATORY_CARE_PROVIDER_SITE_OTHER): Payer: Self-pay | Admitting: Otolaryngology

## 2023-02-14 VITALS — BP 134/87 | HR 95 | Ht 66.0 in | Wt 145.0 lb

## 2023-02-14 DIAGNOSIS — Z87891 Personal history of nicotine dependence: Secondary | ICD-10-CM | POA: Diagnosis not present

## 2023-02-14 DIAGNOSIS — E890 Postprocedural hypothyroidism: Secondary | ICD-10-CM

## 2023-02-14 DIAGNOSIS — R070 Pain in throat: Secondary | ICD-10-CM

## 2023-02-14 DIAGNOSIS — K146 Glossodynia: Secondary | ICD-10-CM | POA: Diagnosis not present

## 2023-02-14 DIAGNOSIS — K117 Disturbances of salivary secretion: Secondary | ICD-10-CM | POA: Diagnosis not present

## 2023-02-14 DIAGNOSIS — K219 Gastro-esophageal reflux disease without esophagitis: Secondary | ICD-10-CM | POA: Diagnosis not present

## 2023-02-14 MED ORDER — FAMOTIDINE 20 MG PO TABS: 20.0000 mg | ORAL_TABLET | Freq: Two times a day (BID) | ORAL | 3 refills | Status: AC

## 2023-02-14 NOTE — Progress Notes (Signed)
ENT CONSULT:  Reason for Consult: mouth pain/throat pain concern for burning mouth syndrome     HPI: Julie Arias is an 72 y.o. female is here for 3 months of chronic oral pain for initial evaluation with me.  She recalls that it started after breaking her dentures and having to repair her lower dentures herself and feeling as if the fit was not adequate. This occurred 3 months ago and she feels it was induced by her dentures not fitting right. She was seen by her dentist and was given Magic mouthwash, but it did not help. She applies OTC benzocaine over the area along right lower teeth/back of her mouth and it seems to help. She is a former smoker, has been a smoker for 20 yrs, 1 PPD. She quit 20 yrs ago. Denies hx of head and neck surgery, denies XRT to head and neck. She had an accident after an accident when she was 55, and required dentures after the age of 89, this was after she had trauma and had dental fractures, and the teeth were loose/had to be pulled. No other dental procedures such as dental implants. She is on daily multivitamin, but does not take it regularly. She was started on B12 shots and Vit D supplementation. This was started 2 weeks ago after her PCP checked labs. She had radioactive iodine for hyperthyroidism several yrs ago, on thyroid hormone supplementation. No hx of thyroid cancer or thyroid surgery.   Records Reviewed:  Hx of diverticulitis requiring bowel resection ileostomy s/p closure of the ileostomy 2021  Note by Dr Sterling Big, General Surgery, 09/19/2019 [name] is a 72 year old female well-known to me with a prior history of diverticulitis requiring low anterior resection and diverting loop ileostomy.  More recently she developed right neck hematoma that has resolved.  More importantly she did develop Covid requiring hospitalization.  She did have an acute kidney injury related to hypovolemia.  she is now recovered and is doing very well.  She is able to eat,  no nausea no vomiting abdominal pain ostomy is working well. I have personally reviewed her barium enema showing no evidence of stricture.  No other concerning lesions. She is strong and  looking forward to her reversal. Recent labs including a CBC and CMP that I have reviewed and they were completely normal.    Past Medical History:  Diagnosis Date   COVID-19    positive Jul 18, 2019   Diverticulitis    caused "perforated colon" hospitalized- no surgery   GERD (gastroesophageal reflux disease)    Hematoma 07/2019   neck-right side   Hypothyroidism    Thyroid disease     Past Surgical History:  Procedure Laterality Date   ABDOMINAL HYSTERECTOMY     COLON RESECTION N/A 05/04/2019   Procedure: LAPAROSCOPIC SIGMOID COLON RESECTION;  Surgeon: Leafy Ro, MD;  Location: [facility] ORS;  Service: General;  Laterality: N/A;   COLONOSCOPY W/ POLYPECTOMY     COLONOSCOPY WITH PROPOFOL N/A 07/07/2015   Procedure: COLONOSCOPY WITH PROPOFOL;  Surgeon: Charolett Bumpers, MD;  Location: [facility] END[facility];  Service: Endoscopy;  Laterality: N/A;   COLONOSCOPY WITH PROPOFOL N/A 04/06/2019   Procedure: COLONOSCOPY WITH PROPOFOL;  Surgeon: Wyline Mood, MD;  Location: [facility] ENDOSCOPY;  Service: Gastroenterology;  Laterality: N/A;   GANGLION CYST EXCISION Right 12/2018   foot   ILEOSTOMY CLOSURE N/A 10/19/2019   Procedure: ILEOSTOMY TAKEDOWN;  Surgeon: Leafy Ro, MD;  Location: [facility] ORS;  Service: General;  Laterality: N/A;    Family History  Problem Relation Age of Onset   Lung disease Father    Lung cancer Brother     Social History:  reports that she quit smoking about 19 years ago. Her smoking use included cigarettes. She started smoking about 44 years ago. She has a 25 pack-year smoking history. She has never used smokeless tobacco. She reports that she does not drink alcohol and does not use drugs.  Allergies:  Allergies  Allergen Reactions   Aleve [Naproxen] Other (See Comments)   Effexor Xr  [Venlafaxine] Other (See Comments)   Aleve [Naproxen Sodium] Rash    Medications: I have reviewed the patient's current medications.   The PMH, PSH, Medications, Allergies, and SH were reviewed and updated.  ROS: Constitutional: Negative for fever, weight loss and weight gain. Cardiovascular: Negative for chest pain and dyspnea on exertion. Respiratory: Is not experiencing shortness of breath at rest. Gastrointestinal: Negative for nausea and vomiting. Neurological: Negative for headaches. Psychiatric: The patient is not nervous/anxious See HPI for pertinent (+)   Blood pressure 134/87, pulse 95, height 5\' 6"  (1.676 m), weight 145 lb (65.8 kg), SpO2 98%.  PHYSICAL EXAM:  Exam: General: Well-developed, well-nourished Respiratory Respiratory effort: Equal inspiration and expiration without stridor Cardiovascular Peripheral Vascular: Warm extremities with equal color/perfusion Eyes: No nystagmus with equal extraocular motion bilaterally Neuro/Psych/Balance: Patient oriented to person, place, and time; Appropriate mood and affect; Gait is intact with no imbalance; Cranial nerves I-XII are intact Head and Face Inspection: Normocephalic and atraumatic without mass or lesion Palpation: Facial skeleton intact without bony stepoffs Salivary Glands: No mass or tenderness Facial Strength: Facial motility symmetric and full bilaterally ENT Pinna: External ear intact and fully developed External canal: Canal is patent with intact skin Tympanic Membrane: Clear and mobile External Nose: No scar or anatomic deformity Internal Nose: Septum intact and midline. No edema, polyp, or rhinorrhea Lips, Teeth, and gums: Mucosa and teeth intact and viable TMJ: No pain to palpation with full mobility Oral cavity/oropharynx: No erythema or exudate, no lesions present Nasopharynx: No mass or lesion with intact mucosa Hypopharynx: Intact mucosa without pooling of secretions Larynx Glottic: Full true  vocal cord mobility without lesion or mass Supraglottic: Normal appearing epiglottis and AE folds Interarytenoid Space: No or minimal pachydermia or edema Subglottic Space: Patent without lesion or edema Neck Neck and Trachea: Midline trachea without mass or lesion Thyroid: No mass or nodularity Lymphatics: No lymphadenopathy  Procedure: Preoperative diagnosis: throat pain and mouth pain   Postoperative diagnosis:   Same + GERD/LPR  Procedure: Flexible fiberoptic laryngoscopy  Surgeon: Ashok Croon, MD  Anesthesia: Topical lidocaine and Afrin Complications: None Condition is stable throughout exam  Indications and consent:  The patient presents to the clinic with Indirect laryngoscopy view was incomplete. Thus it was recommended that they undergo a flexible fiberoptic laryngoscopy. All of the risks, benefits, and potential complications were reviewed with the patient preoperatively and verbal informed consent was obtained.  Procedure: The patient was seated upright in the clinic. Topical lidocaine and Afrin were applied to the nasal cavity. After adequate anesthesia had occurred, I then proceeded to pass the flexible telescope into the nasal cavity. The nasal cavity was patent without rhinorrhea or polyp. The nasopharynx was also patent without mass or lesion. The base of tongue was visualized and was normal. There were no signs of pooling of secretions in the piriform sinuses. The true vocal folds were mobile bilaterally. There were no signs of glottic or  supraglottic mucosal lesion or mass. There was moderate interarytenoid pachydermia and post cricoid edema. The telescope was then slowly withdrawn and the patient tolerated the procedure throughout.   Studies Reviewed:CT max/face 01/27/2023 CLINICAL DATA:  Eval for mass or abscess. No localizing information is available   EXAM: CT MAXILLOFACIAL WITH CONTRAST   TECHNIQUE: Multidetector CT imaging of the maxillofacial structures  was performed with intravenous contrast. Multiplanar CT image reconstructions were also generated.   RADIATION DOSE REDUCTION: This exam was performed according to the departmental dose-optimization program which includes automated exposure control, adjustment of the mA and/or kV according to patient size and/or use of iterative reconstruction technique.   CONTRAST:  75mL ISOVUE-300 IOPAMIDOL (ISOVUE-300) INJECTION 61%   COMPARISON:  None Available.   FINDINGS: Osseous: No fracture or mandibular dislocation. No destructive process.   Orbits: Right lens replacement.  Orbits are otherwise unremarkable.   Sinuses: No middle ear or mastoid effusion. Paranasal sinuses clear.   Soft tissues: Patient is edentulous. There is no evidence of an odontogenic soft tissue abscess. No evidence of soft tissue mass.   Limited intracranial: No significant or unexpected finding.   IMPRESSION: No evidence of soft tissue mass or abscess.  Assessment/Plan: Encounter Diagnoses  Name Primary?   Throat pain Yes   Burning mouth syndrome    Xerostomia    History of tobacco abuse    Gastroesophageal reflux disease without esophagitis    72 year old female with chronic pain in the back of her throat on the right side that initially started after she broke her dentures and had to use them despite of it being broken she thinks it was related to an ulcer that broken dentures may have induced although she has seen her dentist already and he performed an exam and did not identify any lesions on the mouth.  She reports remote history of smoking but quit 20 years ago denies other risk factors for head neck cancer such as alcohol abuse.  She is sent here for evaluation due to severity of her symptoms such as her throat pain that is constant dull achy but significant enough to affect her quality of life.  She did not have any relief of the symptoms after receiving a prescription for Magic mouthwash.  Currently uses  topical benzocaine available over-the-counter and it seems to help.  On my exam including evaluation of her oropharynx oral cavity and flexible scope exam I did not identify any masses or mucosal changes that that could be causing her symptoms.  Will obtain additional imaging to completely rule out such as MRI of the neck that we will assess both CNS and peripheral causes of neuropathic pain.  If negative with will consider management of burning mouth syndrome which is high on my differential.  I also initiated management of GERD LPR due to evidence of that on flexible scope exam including alginate therapy and daily famotidine.  Of note chart review indicates that when she underwent surgery for diverticulitis, she apparently developed right neck hematoma, and it is unclear to me the source of the bleeding and the neck of abdominal surgery was done.  For that reason due to chronic right-sided throat oropharyngeal pain which she endorsed when I did a bimanual palpation of the oropharynx through the mouth, additional imaging is warranted.   - schedule MRI neck to better assess for neoplasm/other sources of her pain sx including CNS or peripheral nerve lesions 2/2 persistent sx causing the patient significant distress and pain - start Famotidine  and reflux gourmet (supplement from [employer]) for suspected GERD/LPR based on the scope exam today - she currently self-treats with PPI on PRN basis. She was also counseled to learn about diet/lifestyle changes to minimize reflux  -We discussed various strategies to optimize hydration particularly since this could cause dry mouth and burning mouth syndrome and she is at high risk of xerostomia 2/2 being treated with radioactive iodine in the past for thyroid disease - continue vitamin supplementation per instructions from your PCP  - return in a few weeks after imaging -will consider alpha lipoic acid supplementation if imaging is negative as an adjuvant therapy for  suspected burning mouth syndrome and other strategies as above -Records also indicate history of sigmoid colon resection for an episode of diverticulitis and the I am wondering if that could have anything to do with vitamin deficiencies since she may not be consuming diet provides all the micronutrients -I advised the patient to start daily multivitamin that is age-appropriate in addition to supplementation of vitamin D and B12 initiated by her primary care doctor -RTC 2 mo after imaging   Thank you for allowing me to participate in the care of this patient. Please do not hesitate to contact me with any questions or concerns.   Ashok Croon, MD Otolaryngology [facility] Phone: [phone] Fax: [phone]    02/14/2023, 3:59 PM

## 2023-02-14 NOTE — Patient Instructions (Signed)
-   schedule MRI neck  - start Famotidine and reflux gourmet (supplement from [employer]) - hydrate well  - continue vitamin supplementation per instructions from your PCP  - return in a few weeks after imaging

## 2023-02-23 DIAGNOSIS — K068 Other specified disorders of gingiva and edentulous alveolar ridge: Secondary | ICD-10-CM | POA: Diagnosis not present

## 2023-02-23 DIAGNOSIS — G4700 Insomnia, unspecified: Secondary | ICD-10-CM | POA: Diagnosis not present

## 2023-02-23 DIAGNOSIS — K219 Gastro-esophageal reflux disease without esophagitis: Secondary | ICD-10-CM | POA: Diagnosis not present

## 2023-02-23 DIAGNOSIS — E559 Vitamin D deficiency, unspecified: Secondary | ICD-10-CM | POA: Diagnosis not present

## 2023-02-23 DIAGNOSIS — N952 Postmenopausal atrophic vaginitis: Secondary | ICD-10-CM | POA: Diagnosis not present

## 2023-02-23 DIAGNOSIS — M8580 Other specified disorders of bone density and structure, unspecified site: Secondary | ICD-10-CM | POA: Diagnosis not present

## 2023-02-23 DIAGNOSIS — M1990 Unspecified osteoarthritis, unspecified site: Secondary | ICD-10-CM | POA: Diagnosis not present

## 2023-02-23 DIAGNOSIS — E7800 Pure hypercholesterolemia, unspecified: Secondary | ICD-10-CM | POA: Diagnosis not present

## 2023-02-23 DIAGNOSIS — I10 Essential (primary) hypertension: Secondary | ICD-10-CM | POA: Diagnosis not present

## 2023-02-23 DIAGNOSIS — E039 Hypothyroidism, unspecified: Secondary | ICD-10-CM | POA: Diagnosis not present

## 2023-02-23 DIAGNOSIS — Z0000 Encounter for general adult medical examination without abnormal findings: Secondary | ICD-10-CM | POA: Diagnosis not present

## 2023-02-23 DIAGNOSIS — E538 Deficiency of other specified B group vitamins: Secondary | ICD-10-CM | POA: Diagnosis not present

## 2023-03-14 ENCOUNTER — Telehealth: Payer: Self-pay

## 2023-03-14 NOTE — Telephone Encounter (Signed)
Called [employer] CPT code Authorization# [id number] from 03/14/2023-09/10/2023  IRJ#[id number]

## 2023-04-19 ENCOUNTER — Telehealth (INDEPENDENT_AMBULATORY_CARE_PROVIDER_SITE_OTHER): Payer: Self-pay

## 2023-04-19 NOTE — Telephone Encounter (Signed)
Contacted patient about appointment. Pt wasn't able to do MRI. Gave her the number to radiology. Notified her that it was already pre authorized she just needed to call and schedule appointment.   Front desk, can someone contact patient to reschedule appointment with Dr. Irene Pap within 2-3 weeks.

## 2023-04-20 ENCOUNTER — Ambulatory Visit (INDEPENDENT_AMBULATORY_CARE_PROVIDER_SITE_OTHER): Payer: Medicare HMO | Admitting: Otolaryngology

## 2023-04-22 ENCOUNTER — Ambulatory Visit (HOSPITAL_COMMUNITY)
Admission: RE | Admit: 2023-04-22 | Discharge: 2023-04-22 | Disposition: A | Discharge status: Home / Self Care | Payer: Medicare HMO | Source: Ambulatory Visit | Attending: Otolaryngology | Admitting: Otolaryngology

## 2023-04-22 DIAGNOSIS — R070 Pain in throat: Secondary | ICD-10-CM | POA: Insufficient documentation

## 2023-04-22 DIAGNOSIS — R519 Headache, unspecified: Secondary | ICD-10-CM | POA: Diagnosis not present

## 2023-04-22 MED ORDER — GADOBUTROL 1 MMOL/ML IV SOLN
6.0000 mL | Freq: Once | INTRAVENOUS | Status: AC | PRN
  Administered 2023-04-22: 6 mL via INTRAVENOUS

## 2023-04-26 DIAGNOSIS — E559 Vitamin D deficiency, unspecified: Secondary | ICD-10-CM | POA: Diagnosis not present

## 2023-04-26 DIAGNOSIS — E039 Hypothyroidism, unspecified: Secondary | ICD-10-CM | POA: Diagnosis not present

## 2023-04-26 DIAGNOSIS — E538 Deficiency of other specified B group vitamins: Secondary | ICD-10-CM | POA: Diagnosis not present

## 2023-05-04 ENCOUNTER — Encounter (INDEPENDENT_AMBULATORY_CARE_PROVIDER_SITE_OTHER): Payer: Self-pay | Admitting: Otolaryngology

## 2023-05-04 ENCOUNTER — Ambulatory Visit (INDEPENDENT_AMBULATORY_CARE_PROVIDER_SITE_OTHER): Payer: Medicare HMO | Admitting: Otolaryngology

## 2023-05-04 VITALS — BP 154/80 | HR 89 | Ht 62.0 in | Wt 141.0 lb

## 2023-05-04 DIAGNOSIS — K146 Glossodynia: Secondary | ICD-10-CM

## 2023-05-04 DIAGNOSIS — R6884 Jaw pain: Secondary | ICD-10-CM | POA: Diagnosis not present

## 2023-05-04 DIAGNOSIS — K117 Disturbances of salivary secretion: Secondary | ICD-10-CM

## 2023-05-04 DIAGNOSIS — K219 Gastro-esophageal reflux disease without esophagitis: Secondary | ICD-10-CM

## 2023-05-04 DIAGNOSIS — Z87891 Personal history of nicotine dependence: Secondary | ICD-10-CM

## 2023-05-04 DIAGNOSIS — R070 Pain in throat: Secondary | ICD-10-CM

## 2023-05-04 DIAGNOSIS — M26621 Arthralgia of right temporomandibular joint: Secondary | ICD-10-CM

## 2023-05-04 DIAGNOSIS — E890 Postprocedural hypothyroidism: Secondary | ICD-10-CM

## 2023-05-04 NOTE — Progress Notes (Addendum)
ENT Progress Note  Update 05/04/23  She reports no significant symptom change since last office visit.  Started vitamin D supplementation with PCP. She takes gabapentin and it helps with her pain.  She reports that her pain is located along the right jaw and right side of her face, and radiates down her neck at times and also into her throat.  She has upper and lower dentures.   Initial Evaluation 02/14/23  Reason for Consult: mouth pain/throat pain concern for burning mouth syndrome     HPI: Julie Arias is an 72 y.o. female is here for 3 months of chronic oral pain for initial evaluation with me.  She recalls that it started after breaking her dentures and having to repair her lower dentures herself and feeling as if the fit was not adequate. This occurred 3 months ago and she feels it was induced by her dentures not fitting right. She was seen by her dentist and was given Magic mouthwash, but it did not help. She applies OTC benzocaine over the area along right lower teeth/back of her mouth and it seems to help. She is a former smoker, has been a smoker for 20 yrs, 1 PPD. She quit 20 yrs ago. Denies hx of head and neck surgery, denies XRT to head and neck. She had an accident after an accident when she was 6, and required dentures after the age of 72, this was after she had trauma and had dental fractures, and the teeth were loose/had to be pulled. No other dental procedures such as dental implants. She is on daily multivitamin, but does not take it regularly. She was started on B12 shots and Vit D supplementation. This was started 2 weeks ago after her PCP checked labs. She had radioactive iodine for hyperthyroidism several yrs ago, on thyroid hormone supplementation. No hx of thyroid cancer or thyroid surgery.   Records Reviewed:  Hx of diverticulitis requiring bowel resection ileostomy s/p closure of the ileostomy 2021  Note by Dr Julie Arias, General Surgery, 09/19/2019 Julie Arias is a  72 year old female well-known to me with a prior history of diverticulitis requiring low anterior resection and diverting loop ileostomy.  More recently she developed right neck hematoma that has resolved.  More importantly she did develop Covid requiring hospitalization.  She did have an acute kidney injury related to hypovolemia.  she is now recovered and is doing very well.  She is able to eat, no nausea no vomiting abdominal pain ostomy is working well. I have personally reviewed her barium enema showing no evidence of stricture.  No other concerning lesions. She is strong and  looking forward to her reversal. Recent labs including a CBC and CMP that I have reviewed and they were completely normal.    Past Medical History:  Diagnosis Date   COVID-19    positive Jul 18, 2019   Diverticulitis    caused "perforated colon" hospitalized- no surgery   GERD (gastroesophageal reflux disease)    Hematoma 07/2019   neck-right side   Hypothyroidism    Thyroid disease     Past Surgical History:  Procedure Laterality Date   ABDOMINAL HYSTERECTOMY     COLON RESECTION N/A 05/04/2019   Procedure: LAPAROSCOPIC SIGMOID COLON RESECTION;  Surgeon: Julie Ro, MD;  Location: [facility] ORS;  Service: General;  Laterality: N/A;   COLONOSCOPY W/ POLYPECTOMY     COLONOSCOPY WITH PROPOFOL N/A 07/07/2015   Procedure: COLONOSCOPY WITH PROPOFOL;  Surgeon: Julie Bumpers, MD;  Location: [facility];  Service: Endoscopy;  Laterality: N/A;   COLONOSCOPY WITH PROPOFOL N/A 04/06/2019   Procedure: COLONOSCOPY WITH PROPOFOL;  Surgeon: Julie Mood, MD;  Location: [facility] ENDOSCOPY;  Service: Gastroenterology;  Laterality: N/A;   GANGLION CYST EXCISION Right 12/2018   foot   ILEOSTOMY CLOSURE N/A 10/19/2019   Procedure: ILEOSTOMY TAKEDOWN;  Surgeon: Julie Ro, MD;  Location: [facility] ORS;  Service: General;  Laterality: N/A;    Family History  Problem Relation Age of Onset   Lung disease Father    Lung cancer Brother      Social History:  reports that she quit smoking about 19 years ago. Her smoking use included cigarettes. She started smoking about 44 years ago. She has a 25 pack-year smoking history. She has never used smokeless tobacco. She reports that she does not drink alcohol and does not use drugs.  Allergies:  Allergies  Allergen Reactions   Aleve [Naproxen] Other (See Comments)   Effexor Xr [Venlafaxine] Other (See Comments)   Aleve [Naproxen Sodium] Rash    Medications: I have reviewed the patient's current medications.   The PMH, PSH, Medications, Allergies, and SH were reviewed and updated.  ROS: Constitutional: Negative for fever, weight loss and weight gain. Cardiovascular: Negative for chest pain and dyspnea on exertion. Respiratory: Is not experiencing shortness of breath at rest. Gastrointestinal: Negative for nausea and vomiting. Neurological: Negative for headaches. Psychiatric: The patient is not nervous/anxious See HPI for pertinent (+)   Blood pressure (!) 154/80, pulse 89, height 5\' 2"  (1.575 m), weight 141 lb (64 kg), SpO2 95%.  PHYSICAL EXAM:  Exam: General: Well-developed, well-nourished Respiratory Respiratory effort: Equal inspiration and expiration without stridor Cardiovascular Peripheral Vascular: Warm extremities with equal color/perfusion Eyes: No nystagmus with equal extraocular motion bilaterally Neuro/Psych/Balance: Patient oriented to person, place, and time; Appropriate Arias and affect; Gait is intact with no imbalance; Cranial nerves I-XII are intact Head and Face Inspection: Normocephalic and atraumatic without mass or lesion Palpation: Facial skeleton intact without bony stepoffs Salivary Glands: No mass or tenderness Facial Strength: Facial motility symmetric and full bilaterally ENT Pinna: External ear intact and fully developed External canal: Canal is patent with intact skin Tympanic Membrane: Clear and mobile External Nose: No scar or  anatomic deformity Lips, Teeth, and gums: Mucosa and teeth intact and viable TMJ: Mild pain to palpation with full mobility, and no trismus but mild subluxation b/l Oral cavity/oropharynx: No erythema or exudate, no lesions present Neck Neck and Trachea: Midline trachea without mass or lesion Thyroid: No mass or nodularity Lymphatics: No lymphadenopathy  Procedure performed during her last office visit: Preoperative diagnosis: throat pain and mouth pain   Postoperative diagnosis:   Same + GERD/LPR  Procedure: Flexible fiberoptic laryngoscopy  Surgeon: Ashok Croon, MD  Anesthesia: Topical lidocaine and Afrin Complications: None Condition is stable throughout exam  Indications and consent:  The patient presents to the clinic with Indirect laryngoscopy view was incomplete. Thus it was recommended that they undergo a flexible fiberoptic laryngoscopy. All of the risks, benefits, and potential complications were reviewed with the patient preoperatively and verbal informed consent was obtained.  Procedure: The patient was seated upright in the clinic. Topical lidocaine and Afrin were applied to the nasal cavity. After adequate anesthesia had occurred, I then proceeded to pass the flexible telescope into the nasal cavity. The nasal cavity was patent without rhinorrhea or polyp. The nasopharynx was also patent without mass or lesion. The base of tongue was visualized and was  normal. There were no signs of pooling of secretions in the piriform sinuses. The true vocal folds were mobile bilaterally. There were no signs of glottic or supraglottic mucosal lesion or mass. There was moderate interarytenoid pachydermia and post cricoid edema. The telescope was then slowly withdrawn and the patient tolerated the procedure throughout.   Studies Reviewed:CT max/face 01/27/2023 CLINICAL DATA:  Eval for mass or abscess. No localizing information is available   EXAM: CT MAXILLOFACIAL WITH CONTRAST    TECHNIQUE: Multidetector CT imaging of the maxillofacial structures was performed with intravenous contrast. Multiplanar CT image reconstructions were also generated.   RADIATION DOSE REDUCTION: This exam was performed according to the departmental dose-optimization program which includes automated exposure control, adjustment of the mA and/or kV according to patient size and/or use of iterative reconstruction technique.   CONTRAST:  75mL ISOVUE-300 IOPAMIDOL (ISOVUE-300) INJECTION 61%   COMPARISON:  None Available.   FINDINGS: Osseous: No fracture or mandibular dislocation. No destructive process.   Orbits: Right lens replacement.  Orbits are otherwise unremarkable.   Sinuses: No middle ear or mastoid effusion. Paranasal sinuses clear.   Soft tissues: Patient is edentulous. There is no evidence of an odontogenic soft tissue abscess. No evidence of soft tissue mass.   Limited intracranial: No significant or unexpected finding.   IMPRESSION: No evidence of soft tissue mass or abscess.  MRI neck done after last office visit 04/22/23 CLINICAL DATA:  Throat pain. Pain at the floor of the mouth on the right in the posterior mandibular region.   EXAM: MRI OF THE NECK WITH CONTRAST   TECHNIQUE: Multiplanar, multisequence MR imaging was performed following the administration of intravenous contrast.   CONTRAST:  6mL GADAVIST GADOBUTROL 1 MMOL/ML IV SOLN   COMPARISON:  CT 01/27/2023   FINDINGS: Pharynx and larynx: No mucosal or submucosal lesion. No evidence of mass or inflammatory disease. Specific attention to the tongue, sublingual and submandibular region does not show an abnormality.   Salivary glands: Parotid and submandibular glands are normal.   Thyroid: Normal   Lymph nodes: No lymphadenopathy on either side of the neck. Normal cervical chain nodes.   Vascular: No abnormal vascular finding.   Limited intracranial: Normal   Visualized orbits: Not  included   Mastoids and visualized paranasal sinuses: Clear   Skeleton: Ordinary cervical spondylosis.   Upper chest: Negative   Other: None   IMPRESSION: No abnormality seen to explain the clinical presentation. No evidence of mass or inflammatory disease. No evidence of lymphadenopathy.  Assessment/Plan: Encounter Diagnoses  Name Primary?   Arthralgia of right temporomandibular joint    Burning mouth syndrome    Xerostomia    Gastroesophageal reflux disease without esophagitis    History of tobacco abuse    Throat pain    Postablative hypothyroidism    Jaw pain Yes    72 year old female with chronic pain in the back of her throat on the right side that initially started after she broke her dentures and had to use them despite of it being broken she thinks it was related to an ulcer that broken dentures may have induced although she has seen her dentist already and he performed an exam and did not identify any lesions on the mouth.  She reports remote history of smoking but quit 20 years ago denies other risk factors for head neck cancer such as alcohol abuse.  She is sent here for evaluation due to severity of her symptoms such as her throat pain that is constant  dull achy but significant enough to affect her quality of life.  She did not have any relief of the symptoms after receiving a prescription for Magic mouthwash.  Currently uses topical benzocaine available over-the-counter and it seems to help.  On my exam including evaluation of her oropharynx oral cavity and flexible scope exam I did not identify any masses or mucosal changes that that could be causing her symptoms.  Will obtain additional imaging to completely rule out such as MRI of the neck that we will assess both CNS and peripheral causes of neuropathic pain.  If negative with will consider management of burning mouth syndrome which is high on my differential.  I also initiated management of GERD LPR due to evidence of  that on flexible scope exam including alginate therapy and daily famotidine.  Of note chart review indicates that when she underwent surgery for diverticulitis, she apparently developed right neck hematoma, and it is unclear to me the source of the bleeding and the neck of abdominal surgery was done.  For that reason due to chronic right-sided throat oropharyngeal pain which she endorsed when I did a bimanual palpation of the oropharynx through the mouth, additional imaging is warranted.   - schedule MRI neck to better assess for neoplasm/other sources of her pain sx including CNS or peripheral nerve lesions 2/2 persistent sx causing the patient significant distress and pain - start Famotidine and reflux gourmet (supplement from [employer]) for suspected GERD/LPR based on the scope exam today - she currently self-treats with PPI on PRN basis. She was also counseled to learn about diet/lifestyle changes to minimize reflux  -We discussed various strategies to optimize hydration particularly since this could cause dry mouth and burning mouth syndrome and she is at high risk of xerostomia 2/2 being treated with radioactive iodine in the past for thyroid disease - continue vitamin supplementation per instructions from your PCP  - return in a few weeks after imaging -will consider alpha lipoic acid supplementation if imaging is negative as an adjuvant therapy for suspected burning mouth syndrome and other strategies as above -Records also indicate history of sigmoid colon resection for an episode of diverticulitis and the I am wondering if that could have anything to do with vitamin deficiencies since she may not be consuming diet provides all the micronutrients -I advised the patient to start daily multivitamin that is age-appropriate in addition to supplementation of vitamin D and B12 initiated by her primary care doctor -RTC 2 mo after imaging   Update 05/04/23 she returns and reports no significant symptom  improvement.  Her MRI was unremarkable without evidence of neoplasm or foreign body or any other pathology to explain her symptoms. Today she describes her pain as pain along the right lower jaw and right face that radiates down her neck and and along her right throat area.  She wears dentures that are not fitting well.  Started on vitamin D supplementation by PCP.  We reviewed previous recommendation to treat reflux with Pepcid and reflux Gourmet.  We also discussed the need for management of xerostomia and burning mouth syndrome.  I advised the patient to start alpha lipoic acid supplementation available over-the-counter at the drugstore.   Jaw pain radiating to the neck and along the right side of the throat.  I suspect in the setting of negative MRI neck, and crepitance mild subluxation and pain with wide mouth opening on exam today, her symptoms could be related to chronic TMJ problem versus ill fitting  dentures, she is edentulous and potential explanation could be abnormal pressure on sensory nerves along the alveolar ridge from the dentures - schedule appt with Oral Surgery  - continue Gabapentin for pain -reports it relieves her pain 2.  Sore throat throat discomfort -negative flexible laryngoscopy and MRI neck - start Famotidine 20 mg twice daily - start Reflux Gourmet for reflux  3.  Burning mouth syndrome xerostomia - start alpha lipoic acid (300 mg) supplement to be taken up to three times a day - available at the drug store without prescription  - continue vitamin supplementation -Continue vitamin supplementation ordered by PCP  Thank you for allowing me to participate in the care of this patient. Please do not hesitate to contact me with any questions or concerns.   Ashok Croon, MD Otolaryngology [facility] Phone: [phone] Fax: [phone]    05/04/2023, 6:07 PM

## 2023-05-04 NOTE — Patient Instructions (Addendum)
-   start Famotidine 20 mg twice daily - start Reflux Gourmet for reflux  - start alpha lipoic acid (300 mg) supplement to be taken up to three times a day - available at the drug store without prescription  - continue vitamin supplementation - schedule appt with Oral Surgery  - continue Gabapentin for pain    TMJ (Temporomandibular Joint Syndrome) The temporomandibular (tem-puh-[id number]-man-DIB-u-lur) joint (TMJ) acts like a sliding hinge, connecting your jawbone to your skull. You have one joint on each side of your jaw. TMJ disorders -- a type of temporomandibular disorder or TMD -- can cause pain in your jaw joint and in the muscles that control jaw movement.  The exact cause of a person's TMJ disorder is often difficult to determine. Your pain may be due to a combination of factors, such as genetics, arthritis or jaw injury. Some people who have jaw pain also tend to clench or grind their teeth (bruxism), although many people habitually clench or grind their teeth and never develop TMJ disorders.  In most cases, the pain and discomfort associated with TMJ disorders is temporary and can be relieved with self-managed care or nonsurgical treatments. This includes stress reduction, softer diet when the pain is present, anti-inflammatory pain medications such as Motrin and warm compresses.

## 2023-05-10 ENCOUNTER — Telehealth: Payer: Self-pay | Admitting: Neurology

## 2023-05-10 ENCOUNTER — Ambulatory Visit: Payer: Medicare HMO | Admitting: Neurology

## 2023-05-10 ENCOUNTER — Encounter: Payer: Self-pay | Admitting: Neurology

## 2023-05-10 VITALS — BP 161/89 | HR 86 | Ht 63.0 in | Wt 140.0 lb

## 2023-05-10 DIAGNOSIS — G500 Trigeminal neuralgia: Secondary | ICD-10-CM | POA: Insufficient documentation

## 2023-05-10 MED ORDER — OXCARBAZEPINE 150 MG PO TABS
150.0000 mg | ORAL_TABLET | Freq: Two times a day (BID) | ORAL | 11 refills | Status: DC
Start: 2023-05-10 — End: 2023-06-02

## 2023-05-10 MED ORDER — GABAPENTIN 300 MG PO CAPS: 600.0000 mg | ORAL_CAPSULE | Freq: Three times a day (TID) | ORAL | 11 refills | Status: DC

## 2023-05-10 NOTE — Telephone Encounter (Signed)
Ok to [employer] my Wed injection slot with vv visit in 6-8 weeks.

## 2023-05-10 NOTE — Progress Notes (Signed)
Chief Complaint  Patient presents with   New Patient (Initial Visit)    Rm 15, Patient alone. Patient reports pain in jaw on inside of mouth, has gotten new dentures but nothing is back there and gabapentin will only help for a month. ENT was unable to help as well. She cannot go longer than an hour without using some numbing medication, this has been going on for almost 5 months now.       ASSESSMENT AND PLAN  Julie Arias is a 72 y.o. female   Right trigeminal neuralgia involving right's V3 branch  MRI of the trigeminal with without contrast to rule out structural abnormality  Higher dose of gabapentin up to 300 mg 2 tablets 3 times a day  Trileptal 150 mg twice a day Virtual visit after MRI of the brain  DIAGNOSTIC DATA (LABS, IMAGING, TESTING) - I reviewed patient records, labs, notes, testing and imaging myself where available.   MEDICAL HISTORY:  Julie Arias, is a 72 year old female, seen in request by her primary care from [facility] Dr. Lupita Raider, for evaluation of right lower face pain, initial evaluation May 10, 2023  History is obtained from the patient and review of electronic medical records. I personally reviewed pertinent available imaging films in PACS.   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, has been with her denture for many years, since May 2024, she began to noticed intermittent right lower jaw pain, no focal skin breakdown noticed, lower jaw pain actually improved by pressure from denture, she also has been bite on benzocaine Q-tip to alleviate her symptoms  She was giving low-dose of gabapentin 100 mg 3 times a day without helping her symptoms much, intermittent radiating pain to her right lower jaw is very bothersome, but she is able to continue her job   PHYSICAL EXAM:   Vitals:   05/10/23 0215 05/10/23 1421  BP: (!) 163/75 (!) 161/89  Pulse: 96 86  Weight:  140 lb (63.5 kg)   Height: 5\' 3"  (1.6 m)    Not recorded     Body mass index is 24.8 kg/m.  PHYSICAL EXAMNIATION:  Gen: NAD, conversant, well nourised, well groomed                     Cardiovascular: Regular rate rhythm, no peripheral edema, warm, nontender. Eyes: Conjunctivae clear without exudates or hemorrhage Neck: Supple, no carotid bruits. Pulmonary: Clear to auscultation bilaterally   NEUROLOGICAL EXAM:  MENTAL STATUS: Speech/cognition: Awake, alert, oriented to history taking and casual conversation CRANIAL NERVES: CN II: Visual fields are full to confrontation. Pupils are round equal and briskly reactive to light. CN III, IV, VI: extraocular movement are normal. No ptosis. CN V: Facial sensation is intact to light touch, bilateral corneal reflexes were normal and symmetric CN VII: Face is symmetric with normal eye closure  CN VIII: Hearing is normal to causal conversation. CN IX, X: Phonation is normal. CN XI: Head turning and shoulder shrug are intact  MOTOR: There is no pronator drift of out-stretched arms. Muscle bulk and tone are normal. Muscle strength is normal.  REFLEXES: Reflexes are 2+ and symmetric at the biceps, triceps, knees, and ankles. Plantar responses are flexor.  SENSORY: Intact to light touch, pinprick and vibratory sensation are intact in fingers and toes.  COORDINATION: There is no trunk or limb dysmetria noted.  GAIT/STANCE: Posture is  normal. Gait is steady with normal steps, base, arm swing, and turning. Heel and toe walking are normal. Tandem gait is normal.  Romberg is absent.  REVIEW OF SYSTEMS:  Full 14 system review of systems performed and notable only for as above All other review of systems were negative.   ALLERGIES: Allergies  Allergen Reactions   Aleve [Naproxen] Other (See Comments)   Effexor Xr [Venlafaxine] Other (See Comments)   Aleve [Naproxen Sodium] Rash    HOME MEDICATIONS: Current Outpatient  Medications  Medication Sig Dispense Refill   clotrimazole (MYCELEX) 10 MG troche Take 10 mg by mouth 5 (five) times daily.     cyclobenzaprine (FLEXERIL) 5 MG tablet Take 1 tablet (5 mg total) by mouth 3 (three) times daily as needed for muscle spasms. 30 tablet 0   EUTHYROX 175 MCG tablet Take 175 mcg by mouth daily before breakfast.      famotidine (PEPCID) 20 MG tablet Take 1 tablet (20 mg total) by mouth 2 (two) times daily. 30 tablet 3   gabapentin (NEURONTIN) 100 MG capsule Take 100 mg by mouth 3 (three) times daily.     ibuprofen (ADVIL) 200 MG tablet Take 400 mg by mouth 2 (two) times daily as needed for headache.     lidocaine (XYLOCAINE) 2 % solution Use as directed 15 mLs in the mouth or throat every 3 (three) hours as needed for mouth pain.     LORazepam (ATIVAN) 0.5 MG tablet Take 0.5 mg by mouth at bedtime as needed for anxiety or sleep.      meclizine (ANTIVERT) 25 MG tablet      omeprazole (PRILOSEC) 40 MG capsule Take 40 mg by mouth every evening.      traMADol (ULTRAM) 50 MG tablet Take 50 mg by mouth every 6 (six) hours as needed for moderate pain.      valACYclovir (VALTREX) 1000 MG tablet Take 1 g by mouth 2 (two) times daily as needed (cold sores/fever blisters.).     No current facility-administered medications for this visit.    PAST MEDICAL HISTORY: Past Medical History:  Diagnosis Date   COVID-19    positive Jul 18, 2019   Diverticulitis    caused "perforated colon" hospitalized- no surgery   GERD (gastroesophageal reflux disease)    Hematoma 07/2019   neck-right side   Hypothyroidism    Thyroid disease     PAST SURGICAL HISTORY: Past Surgical History:  Procedure Laterality Date   ABDOMINAL HYSTERECTOMY     COLON RESECTION N/A 05/04/2019   Procedure: LAPAROSCOPIC SIGMOID COLON RESECTION;  Surgeon: Leafy Ro, MD;  Location: [facility] ORS;  Service: General;  Laterality: N/A;   COLONOSCOPY W/ POLYPECTOMY     COLONOSCOPY WITH PROPOFOL N/A 07/07/2015    Procedure: COLONOSCOPY WITH PROPOFOL;  Surgeon: Charolett Bumpers, MD;  Location: [facility];  Service: Endoscopy;  Laterality: N/A;   COLONOSCOPY WITH PROPOFOL N/A 04/06/2019   Procedure: COLONOSCOPY WITH PROPOFOL;  Surgeon: Wyline Mood, MD;  Location: [facility]COPY;  Service: Gastroenterology;  Laterality: N/A;   GANGLION CYST EXCISION Right 12/2018   foot   ILEOSTOMY CLOSURE N/A 10/19/2019   Procedure: ILEOSTOMY TAKEDOWN;  Surgeon: Leafy Ro, MD;  Location: [facility] ORS;  Service: General;  Laterality: N/A;    FAMILY HISTORY: Family History  Problem Relation Age of Onset   Lung disease Father    Lung cancer Brother     SOCIAL HISTORY: Social History   Socioeconomic History   Marital status: Married  Spouse name: Not on file   Number of children: Not on file   Years of education: Not on file   Highest education level: Not on file  Occupational History   Not on file  Tobacco Use   Smoking status: Former    Current packs/day: 0.00    Average packs/day: 1 pack/day for 25.0 years (25.0 ttl pk-yrs)    Types: Cigarettes    Start date: 01/03/1979    Quit date: 01/03/2004    Years since quitting: 19.3   Smokeless tobacco: Never  Vaping Use   Vaping status: Never Used  Substance and Sexual Activity   Alcohol use: No   Drug use: No   Sexual activity: Not on file  Other Topics Concern   Not on file  Social History Narrative   Not on file   Social Determinants of Health   Financial Resource Strain: Low Risk  (05/16/2019)   Overall Financial Resource Strain (CARDIA)    Difficulty of Paying Living Expenses: Not hard at all  Food Insecurity: No Food Insecurity (02/06/2020)   Hunger Vital Sign    Worried About Running Out of Food in the Last Year: Never true    Ran Out of Food in the Last Year: Never true  Transportation Needs: No Transportation Needs (02/06/2020)   PRAPARE - Administrator, Civil Service (Medical): No    Lack of Transportation (Non-Medical): No   Physical Activity: Not on file  Stress: Not on file  Social Connections: Unknown (12/15/2021)   Received from [facility]   Social Network    Social Network: Not on file  Intimate Partner Violence: Unknown (11/06/2021)   Received from [facility]   HITS    Physically Hurt: Not on file    Insult or Talk Down To: Not on file    Threaten Physical Harm: Not on file    Scream or Curse: Not on file      Levert Feinstein, M.D. Ph.D.  [facility] [address], [address] [place], [place] [address] Ph: [phone] Fax: [phone]  CC:  Lupita Raider, MD [address] [place],  [place] [address]  Lupita Raider, MD

## 2023-05-10 NOTE — Patient Instructions (Addendum)
Meds ordered this encounter  Medications   OXcarbazepine (TRILEPTAL) 150 MG tablet    Sig: Take 1 tablet (150 mg total) by mouth 2 (two) times daily.    Dispense:  60 tablet    Refill:  11   gabapentin (NEURONTIN) 300 MG capsule    Sig: Take 2 capsules (600 mg total) by mouth 3 (three) times daily.    Dispense:  180 capsule    Refill:  11

## 2023-05-10 NOTE — Telephone Encounter (Signed)
At check out pt stated they were told to make a VV appt in 2 weeks. Check out note states 2 months. Should the f/u be in 2 weeks or 2 months? If in 2 weeks, is overbook ok?

## 2023-05-10 NOTE — Telephone Encounter (Signed)
sent to GI they obtain [employer] medicare auth [id number] 

## 2023-05-10 NOTE — Telephone Encounter (Signed)
Called pt and scheduled VV for 11/20 @ 2:00 pm.

## 2023-05-30 ENCOUNTER — Telehealth: Payer: Self-pay | Admitting: Neurology

## 2023-05-30 NOTE — Telephone Encounter (Signed)
Called and spoke to patient, who reports severe pain in her mouth. She went to Dentist yesterday, confirmed no blisters in mouth. She feels like no medication is helping, she is taking gabapentin 600TID and trileptal 150mg  BID and tramadol 100mg  in between. If new medication is sent needs to be to [facility] in [place]. She reports pain has not been under control for five and half months since this started. She wants to know if there is anything else that can be given to help with the pain. I advised I would send to Provider for review and reach out once I hear back.

## 2023-05-30 NOTE — Telephone Encounter (Signed)
Pt called and LVM wanting to know if the RN or MD can suggest something to help her with the pain she is having in her mouth. Pt states she can not eat or sleep with this pain that feels like her mouth has a raw blister. Please advise.

## 2023-05-31 NOTE — Telephone Encounter (Signed)
Call abck to patient who reports, no significant side effects from medication, just not helping with pain for a long period of time. We spoke about timing of medicaiton and she states she was going to try the 2 tablets of trileptal in between medication doses of gabapentin and let us know in a couple of days how it is going for her.

## 2023-05-31 NOTE — Telephone Encounter (Signed)
I failed to reach patient by phone numbers listed  Please call patient's again,  If she has any significant side effect from medications,  has Trileptal helped her symptoms in any ways May consider higher dose of Trileptal 150 mg 2 tablets twice a day, in between gabapentin 3 times a day,

## 2023-06-02 ENCOUNTER — Telehealth: Payer: Self-pay | Admitting: Neurology

## 2023-06-02 ENCOUNTER — Encounter: Payer: Self-pay | Admitting: Neurology

## 2023-06-02 MED ORDER — OXCARBAZEPINE 150 MG PO TABS: 300.0000 mg | ORAL_TABLET | Freq: Two times a day (BID) | ORAL | 11 refills | Status: DC

## 2023-06-02 NOTE — Telephone Encounter (Signed)
Meds ordered this encounter  Medications   OXcarbazepine (TRILEPTAL) 150 MG tablet    Sig: Take 2 tablets (300 mg total) by mouth 2 (two) times daily.    Dispense:  120 tablet    Refill:  11

## 2023-06-02 NOTE — Telephone Encounter (Signed)
Error

## 2023-06-02 NOTE — Telephone Encounter (Signed)
Pt states as a result of Dr Terrace Arabia increasing her OXcarbazepine (TRILEPTAL) 150 MG tablet  she will soon run out and is unable to fill before the 8th, please advise.

## 2023-06-06 ENCOUNTER — Encounter: Payer: Self-pay | Admitting: Neurology

## 2023-06-08 ENCOUNTER — Telehealth: Payer: Self-pay | Admitting: Neurology

## 2023-06-08 NOTE — Telephone Encounter (Signed)
[facility]) site name on authorization needs to be changed to [facility] please have completed by 11/18 at 10:00 am. Then  fax over new authorization with new site to [phone]

## 2023-06-09 NOTE — Telephone Encounter (Signed)
[employer] medicare Berkley Harvey: [id number] exp. 06/09/23-12/06/23 for [facility].

## 2023-06-21 ENCOUNTER — Ambulatory Visit
Admission: RE | Admit: 2023-06-21 | Discharge: 2023-06-21 | Disposition: A | Discharge status: Home / Self Care | Payer: Medicare HMO | Source: Ambulatory Visit | Attending: Neurology | Admitting: Neurology

## 2023-06-21 DIAGNOSIS — G500 Trigeminal neuralgia: Secondary | ICD-10-CM

## 2023-06-21 MED ORDER — GADOPICLENOL 0.5 MMOL/ML IV SOLN
6.0000 mL | Freq: Once | INTRAVENOUS | Status: AC | PRN
  Administered 2023-06-21: 6 mL via INTRAVENOUS

## 2023-06-22 ENCOUNTER — Telehealth: Payer: Medicare HMO | Admitting: Neurology

## 2023-06-22 ENCOUNTER — Telehealth: Payer: Self-pay | Admitting: Neurology

## 2023-06-22 DIAGNOSIS — G500 Trigeminal neuralgia: Secondary | ICD-10-CM | POA: Diagnosis not present

## 2023-06-22 DIAGNOSIS — Z79899 Other long term (current) drug therapy: Secondary | ICD-10-CM

## 2023-06-22 MED ORDER — OXCARBAZEPINE 150 MG PO TABS: 300.0000 mg | ORAL_TABLET | Freq: Three times a day (TID) | ORAL | 11 refills | Status: AC

## 2023-06-22 MED ORDER — PREGABALIN 100 MG PO CAPS: 200.0000 mg | ORAL_CAPSULE | Freq: Three times a day (TID) | ORAL | 5 refills | Status: DC

## 2023-06-22 NOTE — Telephone Encounter (Signed)
Referral  for neurosurgery fax to [facility]. Phone: [phone], Fax: [phone] 

## 2023-06-22 NOTE — Patient Instructions (Addendum)
  Julie Arias. Angelyn Punt, MD, PhD Address: [facility] [address] [place], [place] [address] Phone: [phone]  Meds ordered this encounter  Medications   pregabalin (LYRICA) 100 MG capsule    Sig: Take 2 capsules (200 mg total) by mouth 3 (three) times daily.    Dispense:  180 capsule    Refill:  5   OXcarbazepine (TRILEPTAL) 150 MG tablet    Sig: Take 2 tablets (300 mg total) by mouth 3 (three) times daily.    Dispense:  180 tablet    Refill:  11      Orders Placed This Encounter M-Thursday  Procedures   CBC with Differential/Platelet   Comprehensive metabolic panel   TSH   10-Hydroxycarbazepine   Ambulatory referral to Neurosurgery

## 2023-06-22 NOTE — Progress Notes (Signed)
ASSESSMENT AND PLAN  Julie Arias is a 72 y.o. female   Right trigeminal neuralgia involving right's V3 branch  MRI of the trigeminal with without contrast showed no structural abnormality  She still have significant pain taking Trileptal up to 150 mg 2 tablets 3 times a day, gabapentin 600 mg 3 times a day, still works full-time as a Interior and spatial designer, but complains of medication side effect, Trileptal works for her pain better, will stop gabapentin, try Lyrica 100 mg 3 times a day,  Laboratory evaluation  Refer to [facility] neurosurgeon Dr. Brooke Pace for potential, knife versus microvascular decompression    DIAGNOSTIC DATA (LABS, IMAGING, TESTING) - I reviewed patient records, labs, notes, testing and imaging myself where available.   MEDICAL HISTORY:  Julie Arias, is a 72 year old female, seen in request by her primary care from [facility] Dr. Lupita Raider, for evaluation of right lower face pain, initial evaluation May 10, 2023  History is obtained from the patient and review of electronic medical records. I personally reviewed pertinent available imaging films in PACS.   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, has been with her denture for many years, since May 2024, she began to noticed intermittent right lower jaw pain, no focal skin breakdown noticed, lower jaw pain actually improved by pressure from denture, she also has been bite on benzocaine Q-tip to alleviate her symptoms  She was giving low-dose of gabapentin 100 mg 3 times a day without helping her symptoms much, intermittent radiating pain to her right lower jaw is very bothersome, but she is able to continue her job   Virtual Visit via video UPDATE June 22 2023 I discussed the limitations of evaluation and management by telemedicine and the availability of in person appointments. The patient expressed understanding and agreed  to proceed  Location: Provider: [facility] office; Patient: Home  I connected with [name]  on June 22, 2023 by a video enabled telemedicine application and verified that I am speaking with the correct person using two identifiers.  UPDATED HiSTORY Trileptal helps her pain some, she still have significant right trigeminal shooting pain, sometimes take Trileptal 150 mg 2 tablets up to 3 times a day, in combination with the gabapentin 600 mg 3 times a day, she continues to work as a Oncologist, but complains of medication side effect, significant pain using up to 1 bottle of ambesol from local anesthesia daily  Reviewed MRI of the brain with and without contrast that was normal  This flareup has been ongoing since May 2024, will refer her to [facility] Dr. Brooke Pace for evaluation   Observations/Objective: I have reviewed problem lists, medications, allergies.  Awake, alert, oriented to history taking and casual conversation, facial symmetric, no dysarthria, no aphasia, moving 4 extremities without difficulty      PHYSICAL EXAM:      05/10/2023    2:21 PM 05/10/2023    2:15 AM 05/04/2023    2:03 PM  Vitals with BMI  Height  5\' 3"  5\' 2"   Weight  140 lbs 141 lbs  BMI  24.81 25.78  Systolic 161 163 782  Diastolic 89 75 80  Pulse 86 96 89    PHYSICAL EXAMNIATION:  Gen: NAD, conversant, well nourised, well groomed                     Cardiovascular: Regular rate  rhythm, no peripheral edema, warm, nontender. Eyes: Conjunctivae clear without exudates or hemorrhage Neck: Supple, no carotid bruits. Pulmonary: Clear to auscultation bilaterally   NEUROLOGICAL EXAM:  MENTAL STATUS: Speech/cognition: Awake, alert, oriented to history taking and casual conversation CRANIAL NERVES: CN II: Visual fields are full to confrontation. Pupils are round equal and briskly reactive to light. CN III, IV, VI: extraocular movement are normal. No ptosis. CN V: Facial sensation is  intact to light touch, bilateral corneal reflexes were normal and symmetric CN VII: Face is symmetric with normal eye closure  CN VIII: Hearing is normal to causal conversation. CN IX, X: Phonation is normal. CN XI: Head turning and shoulder shrug are intact  MOTOR: There is no pronator drift of out-stretched arms. Muscle bulk and tone are normal. Muscle strength is normal.  REFLEXES: Reflexes are 2+ and symmetric at the biceps, triceps, knees, and ankles. Plantar responses are flexor.  SENSORY: Intact to light touch, pinprick and vibratory sensation are intact in fingers and toes.  COORDINATION: There is no trunk or limb dysmetria noted.  GAIT/STANCE: Posture is normal. Gait is steady with normal steps, base, arm swing, and turning. Heel and toe walking are normal. Tandem gait is normal.  Romberg is absent.  REVIEW OF SYSTEMS:  Full 14 system review of systems performed and notable only for as above All other review of systems were negative.   ALLERGIES: Allergies  Allergen Reactions   Aleve [Naproxen] Other (See Comments)   Effexor Xr [Venlafaxine] Other (See Comments)   Aleve [Naproxen Sodium] Rash    HOME MEDICATIONS: Current Outpatient Medications  Medication Sig Dispense Refill   clotrimazole (MYCELEX) 10 MG troche Take 10 mg by mouth 5 (five) times daily.     cyclobenzaprine (FLEXERIL) 5 MG tablet Take 1 tablet (5 mg total) by mouth 3 (three) times daily as needed for muscle spasms. 30 tablet 0   EUTHYROX 175 MCG tablet Take 175 mcg by mouth daily before breakfast.      famotidine (PEPCID) 20 MG tablet Take 1 tablet (20 mg total) by mouth 2 (two) times daily. 30 tablet 3   gabapentin (NEURONTIN) 300 MG capsule Take 2 capsules (600 mg total) by mouth 3 (three) times daily. 180 capsule 11   ibuprofen (ADVIL) 200 MG tablet Take 400 mg by mouth 2 (two) times daily as needed for headache.     lidocaine (XYLOCAINE) 2 % solution Use as directed 15 mLs in the mouth or  throat every 3 (three) hours as needed for mouth pain.     LORazepam (ATIVAN) 0.5 MG tablet Take 0.5 mg by mouth at bedtime as needed for anxiety or sleep.      meclizine (ANTIVERT) 25 MG tablet      omeprazole (PRILOSEC) 40 MG capsule Take 40 mg by mouth every evening.      OXcarbazepine (TRILEPTAL) 150 MG tablet Take 2 tablets (300 mg total) by mouth 2 (two) times daily. 120 tablet 11   traMADol (ULTRAM) 50 MG tablet Take 50 mg by mouth every 6 (six) hours as needed for moderate pain.      valACYclovir (VALTREX) 1000 MG tablet Take 1 g by mouth 2 (two) times daily as needed (cold sores/fever blisters.).     No current facility-administered medications for this visit.    PAST MEDICAL HISTORY: Past Medical History:  Diagnosis Date   COVID-19    positive Jul 18, 2019   Diverticulitis    caused "perforated colon" hospitalized- no surgery  GERD (gastroesophageal reflux disease)    Hematoma 07/2019   neck-right side   Hypothyroidism    Thyroid disease     PAST SURGICAL HISTORY: Past Surgical History:  Procedure Laterality Date   ABDOMINAL HYSTERECTOMY     COLON RESECTION N/A 05/04/2019   Procedure: LAPAROSCOPIC SIGMOID COLON RESECTION;  Surgeon: Leafy Ro, MD;  Location: [facility] ORS;  Service: General;  Laterality: N/A;   COLONOSCOPY W/ POLYPECTOMY     COLONOSCOPY WITH PROPOFOL N/A 07/07/2015   Procedure: COLONOSCOPY WITH PROPOFOL;  Surgeon: Charolett Bumpers, MD;  Location: [facility] END[facility];  Service: Endoscopy;  Laterality: N/A;   COLONOSCOPY WITH PROPOFOL N/A 04/06/2019   Procedure: COLONOSCOPY WITH PROPOFOL;  Surgeon: Wyline Mood, MD;  Location: [facility] ENDOSCOPY;  Service: Gastroenterology;  Laterality: N/A;   GANGLION CYST EXCISION Right 12/2018   foot   ILEOSTOMY CLOSURE N/A 10/19/2019   Procedure: ILEOSTOMY TAKEDOWN;  Surgeon: Leafy Ro, MD;  Location: [facility] ORS;  Service: General;  Laterality: N/A;    FAMILY HISTORY: Family History  Problem Relation Age of Onset   Lung  disease Father    Lung cancer Brother     SOCIAL HISTORY: Social History   Socioeconomic History   Marital status: Married    Spouse name: Not on file   Number of children: Not on file   Years of education: Not on file   Highest education level: Not on file  Occupational History   Not on file  Tobacco Use   Smoking status: Former    Current packs/day: 0.00    Average packs/day: 1 pack/day for 25.0 years (25.0 ttl pk-yrs)    Types: Cigarettes    Start date: 01/03/1979    Quit date: 01/03/2004    Years since quitting: 19.4   Smokeless tobacco: Never  Vaping Use   Vaping status: Never Used  Substance and Sexual Activity   Alcohol use: No   Drug use: No   Sexual activity: Not on file  Other Topics Concern   Not on file  Social History Narrative   Not on file   Social Determinants of Health   Financial Resource Strain: Low Risk  (05/16/2019)   Overall Financial Resource Strain (CARDIA)    Difficulty of Paying Living Expenses: Not hard at all  Food Insecurity: No Food Insecurity (02/06/2020)   Hunger Vital Sign    Worried About Running Out of Food in the Last Year: Never true    Ran Out of Food in the Last Year: Never true  Transportation Needs: No Transportation Needs (02/06/2020)   PRAPARE - Administrator, Civil Service (Medical): No    Lack of Transportation (Non-Medical): No  Physical Activity: Not on file  Stress: Not on file  Social Connections: Unknown (12/15/2021)   [facility]   Social Network    Social Network: Not on file  Intimate Partner Violence: Unknown (11/06/2021)   Received from [facility]   HITS    Physically Hurt: Not on file    Insult or Talk Down To: Not on file    Threaten Physical Harm: Not on file    Scream or Curse: Not on file      Levert Feinstein, M.D. Ph.D.  [facility] [address], [address] [place], [place] [address] Ph: [phone] Fax: [phone]  CC:   Lupita Raider, MD [address] [place],  [place] [address]  Lupita Raider, MD

## 2023-06-24 ENCOUNTER — Telehealth: Payer: Self-pay | Admitting: Neurology

## 2023-06-24 NOTE — Telephone Encounter (Signed)
Pt called stating that her pharmacy has reached out to the office regarding her pregabalin (LYRICA) 100 MG capsule  and the dosage but have not received a response. Pt is in a lot of pain and is wanting to know when can she get her  pregabalin (LYRICA) 100 MG capsule

## 2023-06-27 ENCOUNTER — Telehealth: Payer: Self-pay | Admitting: Neurology

## 2023-06-27 ENCOUNTER — Telehealth: Payer: Self-pay

## 2023-06-27 ENCOUNTER — Other Ambulatory Visit (INDEPENDENT_AMBULATORY_CARE_PROVIDER_SITE_OTHER): Payer: Self-pay

## 2023-06-27 DIAGNOSIS — Z0289 Encounter for other administrative examinations: Secondary | ICD-10-CM

## 2023-06-27 DIAGNOSIS — G500 Trigeminal neuralgia: Secondary | ICD-10-CM

## 2023-06-27 NOTE — Telephone Encounter (Signed)
Requested Prescriptions   Pending Prescriptions Disp Refills   pregabalin (LYRICA) 100 MG capsule 180 capsule 5    Sig: Take 2 capsules (200 mg total) by mouth 3 (three) times daily.   Last seen 06/22/23 Next appt not scheduled Pregabalin Adherence No adherence information is available for this medication yet.  I was unable to check the registry

## 2023-06-27 NOTE — Telephone Encounter (Signed)
Called pt to inform her. Pt asked about her referral and I gave her the phone number to the Neurosurgeon office that she was referred to.

## 2023-06-27 NOTE — Telephone Encounter (Signed)
Pt would like to be called regarding lab results from today. Stated that she has been having issues logging into Mychart

## 2023-06-27 NOTE — Telephone Encounter (Signed)
Phone room: Please contact pt and let them know that we do not have results in for the provider to review yet and also try to assist them with getting mychart set up maybe try resending link.  Thanks,  Production assistant, radio

## 2023-06-27 NOTE — Telephone Encounter (Signed)
Pregablin needs a pa according the pt.  Routing to pa team

## 2023-06-28 ENCOUNTER — Other Ambulatory Visit (HOSPITAL_COMMUNITY): Payer: Self-pay

## 2023-06-28 ENCOUNTER — Telehealth: Payer: Self-pay

## 2023-06-28 ENCOUNTER — Telehealth: Payer: Self-pay | Admitting: Neurology

## 2023-06-28 MED ORDER — PREGABALIN 100 MG PO CAPS: 200.0000 mg | ORAL_CAPSULE | Freq: Three times a day (TID) | ORAL | 5 refills | Status: DC

## 2023-06-28 NOTE — Telephone Encounter (Signed)
Please call patient, laboratory evaluation showed significantly decreased TSH, suggest over supplement of her thyroid hormone, she should contact her primary care for adjustment.  I have forwarded the results to her primary care doctor  Lupita Raider, MD

## 2023-06-28 NOTE — Telephone Encounter (Signed)
Dr. Pearlean Brownie, I cannot get it to send to pharmacy

## 2023-06-28 NOTE — Telephone Encounter (Signed)
Tried to submit PA on CMM-got a rejection that PA was already in process and to contact PA line-called Insurance-PT had initiated the PA herself but insurance needed clinical information-  Pharmacy Patient Advocate Encounter  Received notification from [facility] that Prior Authorization for Pregabalin 100MG  Capsules has been APPROVED from 08/02/2022 to 08/02/2023   PA #/Case ID/Reference #: [id number]  QTY of 540 capsules as a 90DS

## 2023-06-28 NOTE — Addendum Note (Signed)
Addended by: Danne Harbor on: 06/28/2023 07:36 AM   Modules accepted: Orders

## 2023-06-28 NOTE — Telephone Encounter (Signed)
PA request has been Approved. New Encounter created for follow up. For additional info see Pharmacy Prior Auth telephone encounter from 06/28/2023.

## 2023-06-29 ENCOUNTER — Other Ambulatory Visit: Payer: Self-pay | Admitting: Neurology

## 2023-06-29 MED ORDER — PREGABALIN 100 MG PO CAPS: 200.0000 mg | ORAL_CAPSULE | Freq: Three times a day (TID) | ORAL | 5 refills | Status: AC

## 2023-07-01 LAB — COMPREHENSIVE METABOLIC PANEL
ALT: 16 [IU]/L (ref 0–32)
AST: 17 [IU]/L (ref 0–40)
Albumin: 4.2 g/dL (ref 3.9–4.9)
Alkaline Phosphatase: 118 [IU]/L (ref 44–121)
BUN/Creatinine Ratio: 16 (ref 12–28)
BUN: 11 mg/dL (ref 8–27)
Bilirubin Total: 0.3 mg/dL (ref 0.0–1.2)
CO2: 24 mmol/L (ref 20–29)
Calcium: 9.3 mg/dL (ref 8.7–10.3)
Chloride: 101 mmol/L (ref 96–106)
Creatinine, Ser: 0.67 mg/dL (ref 0.57–1.00)
Globulin, Total: 2.4 g/dL (ref 1.5–4.5)
Glucose: 85 mg/dL (ref 70–99)
Potassium: 4.3 mmol/L (ref 3.5–5.2)
Sodium: 138 mmol/L (ref 134–144)
Total Protein: 6.6 g/dL (ref 6.0–8.5)
eGFR: 94 mL/min/{1.73_m2} (ref >59)

## 2023-07-01 LAB — CBC WITH DIFFERENTIAL/PLATELET
Basophils Absolute: 0 10*3/uL (ref 0.0–0.2)
Basos: 1 %
EOS (ABSOLUTE): 0.1 10*3/uL (ref 0.0–0.4)
Eos: 1 %
Hematocrit: 46.2 % (ref 34.0–46.6)
Hemoglobin: 15.2 g/dL (ref 11.1–15.9)
Immature Grans (Abs): 0 10*3/uL (ref 0.0–0.1)
Immature Granulocytes: 0 %
Lymphocytes Absolute: 1.5 10*3/uL (ref 0.7–3.1)
Lymphs: 27 %
MCH: 30.3 pg (ref 26.6–33.0)
MCHC: 32.9 g/dL (ref 31.5–35.7)
MCV: 92 fL (ref 79–97)
Monocytes Absolute: 0.5 10*3/uL (ref 0.1–0.9)
Monocytes: 9 %
Neutrophils Absolute: 3.6 10*3/uL (ref 1.4–7.0)
Neutrophils: 62 %
Platelets: 193 10*3/uL (ref 150–450)
RBC: 5.01 x10E6/uL (ref 3.77–5.28)
RDW: 11.9 % (ref 11.7–15.4)
WBC: 5.7 10*3/uL (ref 3.4–10.8)

## 2023-07-01 LAB — 10-HYDROXYCARBAZEPINE: Oxcarbazepine SerPl-Mcnc: 10 ug/mL (ref 10–35)

## 2023-07-01 LAB — TSH: TSH: 0.057 u[IU]/mL — ABNORMAL LOW (ref 0.450–4.500)

## 2023-07-04 NOTE — Telephone Encounter (Signed)
Noted  

## 2023-07-04 NOTE — Telephone Encounter (Signed)
Resent referral to [facility] to see Dr. Benson Norway. Phone: [phone], Fax: [phone].

## 2023-07-05 ENCOUNTER — Telehealth: Payer: Self-pay | Admitting: Neurology

## 2023-07-05 NOTE — Telephone Encounter (Signed)
Call to patient and reviewed results, she verbalized understanding and states her PCP has already made medication adjustments.no further questions

## 2023-07-05 NOTE — Telephone Encounter (Signed)
Please let patient, TSH was significantly decreased 0.057, indicating over supplement of her thyroid  I have forwarded result to her primary care Lupita Raider, MD, she should contact Dr. Clelia Croft for adjustment of her thyroid dosage  Rest of the laboratory evaluation showed no significant abnormalities.

## 2023-07-11 DIAGNOSIS — G500 Trigeminal neuralgia: Secondary | ICD-10-CM | POA: Diagnosis not present

## 2023-07-14 ENCOUNTER — Telehealth: Payer: Self-pay | Admitting: Neurology

## 2023-07-14 ENCOUNTER — Encounter: Payer: Self-pay | Admitting: Neurology

## 2023-07-14 DIAGNOSIS — Z79899 Other long term (current) drug therapy: Secondary | ICD-10-CM

## 2023-07-14 DIAGNOSIS — G500 Trigeminal neuralgia: Secondary | ICD-10-CM

## 2023-07-14 MED ORDER — DULOXETINE HCL 60 MG PO CPEP: 60.0000 mg | ORAL_CAPSULE | Freq: Every day | ORAL | 11 refills | Status: AC

## 2023-07-14 NOTE — Telephone Encounter (Signed)
Pt is asking for a call from RN to discuss her reaction to  pregabalin (LYRICA) 100 MG capsule, she believes it is causing her to cry constantly, please call pt to discuss.

## 2023-07-14 NOTE — Telephone Encounter (Signed)
Pt returned call. Please call back when available. 

## 2023-07-14 NOTE — Telephone Encounter (Signed)
Call to patient, no answer. Left message to call back.

## 2023-07-14 NOTE — Telephone Encounter (Signed)
Please call pt back when available.

## 2023-07-14 NOTE — Telephone Encounter (Signed)
Pt returning call. Transferred.

## 2023-07-14 NOTE — Telephone Encounter (Signed)
Pt returned cll and stated that the lyrica causes her to constantly cry. 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 She mentioned she has a a lot of sadness.  I told the pt that I would let the provider know and call her back w/recommendations.  Routing to provider.

## 2023-07-14 NOTE — Telephone Encounter (Signed)
Pt is asking for a call to discuss a reaction to  pregabalin (LYRICA) 100 MG capsule

## 2023-07-14 NOTE — Telephone Encounter (Signed)
Returned call to pt and stated that dr. Terrace Arabia hasn't made any recommendations yet. Pt voiced gratitude and understanding

## 2023-07-14 NOTE — Addendum Note (Signed)
Addended by: Levert Feinstein on: 07/14/2023 04:55 PM   Modules accepted: Orders

## 2023-07-14 NOTE — Telephone Encounter (Signed)
She started Lyrica 100mg  2 tid since Dec 6th, 2024, continued on Trileptal 150mg  2 tid,  She felt dizziness, like drunk, did help her pain, but still need to call anesthesia before she eats, in addition, she complains of emotional outbursts, crying all day,  She did not take Lyrica today, pain is worse, emotions is better,   She was seen by Neurosurgeon on Jul 13 2023, was given the options of gamma knife, and micro-decompression,   Suggested  Cymbalta 60 mg after breath first around 9 AM  Continue Trileptal 150 mg 2 tablets 3 times a day, at 8, 12, 6 PM  Hold off Lyrica

## 2023-07-14 NOTE — Telephone Encounter (Signed)
Call to patient, no answer. Left message to return call.

## 2023-08-29 DIAGNOSIS — E039 Hypothyroidism, unspecified: Secondary | ICD-10-CM | POA: Diagnosis not present

## 2023-09-02 DIAGNOSIS — L814 Other melanin hyperpigmentation: Secondary | ICD-10-CM | POA: Diagnosis not present

## 2023-09-02 DIAGNOSIS — L578 Other skin changes due to chronic exposure to nonionizing radiation: Secondary | ICD-10-CM | POA: Diagnosis not present

## 2023-09-02 DIAGNOSIS — D225 Melanocytic nevi of trunk: Secondary | ICD-10-CM | POA: Diagnosis not present

## 2023-09-02 DIAGNOSIS — R233 Spontaneous ecchymoses: Secondary | ICD-10-CM | POA: Diagnosis not present

## 2023-09-07 DIAGNOSIS — F411 Generalized anxiety disorder: Secondary | ICD-10-CM | POA: Diagnosis not present

## 2023-09-07 DIAGNOSIS — G500 Trigeminal neuralgia: Secondary | ICD-10-CM | POA: Diagnosis not present

## 2023-09-07 DIAGNOSIS — G4700 Insomnia, unspecified: Secondary | ICD-10-CM | POA: Diagnosis not present

## 2023-09-07 DIAGNOSIS — K130 Diseases of lips: Secondary | ICD-10-CM | POA: Diagnosis not present

## 2023-12-21 DIAGNOSIS — H52223 Regular astigmatism, bilateral: Secondary | ICD-10-CM | POA: Diagnosis not present

## 2023-12-21 DIAGNOSIS — H524 Presbyopia: Secondary | ICD-10-CM | POA: Diagnosis not present

## 2024-01-17 DIAGNOSIS — K219 Gastro-esophageal reflux disease without esophagitis: Secondary | ICD-10-CM | POA: Diagnosis not present

## 2024-01-17 DIAGNOSIS — E890 Postprocedural hypothyroidism: Secondary | ICD-10-CM | POA: Diagnosis not present

## 2024-01-17 DIAGNOSIS — I129 Hypertensive chronic kidney disease with stage 1 through stage 4 chronic kidney disease, or unspecified chronic kidney disease: Secondary | ICD-10-CM | POA: Diagnosis not present

## 2024-01-17 DIAGNOSIS — G500 Trigeminal neuralgia: Secondary | ICD-10-CM | POA: Diagnosis not present

## 2024-01-17 DIAGNOSIS — Z79890 Hormone replacement therapy (postmenopausal): Secondary | ICD-10-CM | POA: Diagnosis not present

## 2024-01-17 DIAGNOSIS — Z8249 Family history of ischemic heart disease and other diseases of the circulatory system: Secondary | ICD-10-CM | POA: Diagnosis not present

## 2024-01-17 DIAGNOSIS — Z809 Family history of malignant neoplasm, unspecified: Secondary | ICD-10-CM | POA: Diagnosis not present

## 2024-01-17 DIAGNOSIS — Z823 Family history of stroke: Secondary | ICD-10-CM | POA: Diagnosis not present

## 2024-01-17 DIAGNOSIS — N181 Chronic kidney disease, stage 1: Secondary | ICD-10-CM | POA: Diagnosis not present

## 2024-01-17 DIAGNOSIS — H269 Unspecified cataract: Secondary | ICD-10-CM | POA: Diagnosis not present

## 2024-01-17 DIAGNOSIS — Z87891 Personal history of nicotine dependence: Secondary | ICD-10-CM | POA: Diagnosis not present

## 2024-01-17 DIAGNOSIS — H814 Vertigo of central origin: Secondary | ICD-10-CM | POA: Diagnosis not present

## 2024-03-01 DIAGNOSIS — E7800 Pure hypercholesterolemia, unspecified: Secondary | ICD-10-CM | POA: Diagnosis not present

## 2024-03-01 DIAGNOSIS — E039 Hypothyroidism, unspecified: Secondary | ICD-10-CM | POA: Diagnosis not present

## 2024-03-01 DIAGNOSIS — M1990 Unspecified osteoarthritis, unspecified site: Secondary | ICD-10-CM | POA: Diagnosis not present

## 2024-03-01 DIAGNOSIS — F411 Generalized anxiety disorder: Secondary | ICD-10-CM | POA: Diagnosis not present

## 2024-04-01 DIAGNOSIS — F411 Generalized anxiety disorder: Secondary | ICD-10-CM | POA: Diagnosis not present

## 2024-04-01 DIAGNOSIS — E039 Hypothyroidism, unspecified: Secondary | ICD-10-CM | POA: Diagnosis not present

## 2024-04-01 DIAGNOSIS — M1990 Unspecified osteoarthritis, unspecified site: Secondary | ICD-10-CM | POA: Diagnosis not present

## 2024-04-01 DIAGNOSIS — E7800 Pure hypercholesterolemia, unspecified: Secondary | ICD-10-CM | POA: Diagnosis not present

## 2024-05-01 DIAGNOSIS — E039 Hypothyroidism, unspecified: Secondary | ICD-10-CM | POA: Diagnosis not present

## 2024-05-01 DIAGNOSIS — M1990 Unspecified osteoarthritis, unspecified site: Secondary | ICD-10-CM | POA: Diagnosis not present

## 2024-05-01 DIAGNOSIS — F411 Generalized anxiety disorder: Secondary | ICD-10-CM | POA: Diagnosis not present

## 2024-05-01 DIAGNOSIS — E7800 Pure hypercholesterolemia, unspecified: Secondary | ICD-10-CM | POA: Diagnosis not present

## 2024-06-01 DIAGNOSIS — M1990 Unspecified osteoarthritis, unspecified site: Secondary | ICD-10-CM | POA: Diagnosis not present

## 2024-06-01 DIAGNOSIS — E7800 Pure hypercholesterolemia, unspecified: Secondary | ICD-10-CM | POA: Diagnosis not present

## 2024-06-01 DIAGNOSIS — E039 Hypothyroidism, unspecified: Secondary | ICD-10-CM | POA: Diagnosis not present

## 2024-06-01 DIAGNOSIS — F411 Generalized anxiety disorder: Secondary | ICD-10-CM | POA: Diagnosis not present

## 2024-06-06 DIAGNOSIS — Z1231 Encounter for screening mammogram for malignant neoplasm of breast: Secondary | ICD-10-CM | POA: Diagnosis not present

## 2024-06-06 DIAGNOSIS — M8588 Other specified disorders of bone density and structure, other site: Secondary | ICD-10-CM | POA: Diagnosis not present

## 2024-06-12 DIAGNOSIS — Z09 Encounter for follow-up examination after completed treatment for conditions other than malignant neoplasm: Secondary | ICD-10-CM | POA: Diagnosis not present

## 2024-06-12 DIAGNOSIS — K635 Polyp of colon: Secondary | ICD-10-CM | POA: Diagnosis not present

## 2024-06-12 DIAGNOSIS — K5730 Diverticulosis of large intestine without perforation or abscess without bleeding: Secondary | ICD-10-CM | POA: Diagnosis not present

## 2024-06-12 DIAGNOSIS — Z860101 Personal history of adenomatous and serrated colon polyps: Secondary | ICD-10-CM | POA: Diagnosis not present

## 2024-06-12 DIAGNOSIS — Z980 Intestinal bypass and anastomosis status: Secondary | ICD-10-CM | POA: Diagnosis not present

## 2024-06-14 DIAGNOSIS — K635 Polyp of colon: Secondary | ICD-10-CM | POA: Diagnosis not present

## 2024-07-01 DIAGNOSIS — E039 Hypothyroidism, unspecified: Secondary | ICD-10-CM | POA: Diagnosis not present

## 2024-07-01 DIAGNOSIS — E7800 Pure hypercholesterolemia, unspecified: Secondary | ICD-10-CM | POA: Diagnosis not present

## 2024-07-01 DIAGNOSIS — F411 Generalized anxiety disorder: Secondary | ICD-10-CM | POA: Diagnosis not present

## 2024-07-01 DIAGNOSIS — M1990 Unspecified osteoarthritis, unspecified site: Secondary | ICD-10-CM | POA: Diagnosis not present

## 2024-08-27 ENCOUNTER — Ambulatory Visit: Admitting: Surgery

## 2024-09-05 ENCOUNTER — Encounter: Payer: Self-pay | Admitting: Surgery

## 2024-09-05 ENCOUNTER — Ambulatory Visit: Admitting: Surgery

## 2024-09-05 VITALS — BP 143/78 | HR 82 | Ht 66.0 in | Wt 144.0 lb

## 2024-09-05 DIAGNOSIS — K5792 Diverticulitis of intestine, part unspecified, without perforation or abscess without bleeding: Secondary | ICD-10-CM

## 2024-09-05 DIAGNOSIS — R1031 Right lower quadrant pain: Secondary | ICD-10-CM | POA: Diagnosis not present

## 2024-09-05 NOTE — Patient Instructions (Signed)
 We will schedule you for a CT Abdomen/Pelvis as well as a Barium Enema. Once we get this schedule we will give you a call.

## 2024-09-05 NOTE — Progress Notes (Signed)
 Patient ID: Julie Arias, female   DOB: 1951/05/10, 74 y.o.   MRN: [id number]  HPI Julie Arias is a 74 y.o. female seen for abdominal pain.  She did have a prior history of diverticulitis but I did perform a robotic sigmoid colectomy 6 years ago with ileostomy.  She did had an ileostomy takedown 5 years ago or so. She did well and this past year has been having intermittent abdominal pain mainly on the right side it is dull moderate intensity no specific aggravating or aggravating factors.  She did have apparently a colonoscopy by one of the [facility] endoscopies and apparently it was a bit challenging.  Endoscopy suggested surgical evaluation.  She did have  a Gastrografin enema showing widely patent anastomosis, pers reviewed. She endorses no vomiting no fevers no chills normal bowel movements  HPI  Past Medical History:  Diagnosis Date   COVID-19    positive Jul 18, 2019   Diverticulitis    caused perforated colon hospitalized- no surgery   GERD (gastroesophageal reflux disease)    Hematoma 07/2019   neck-right side   Hypothyroidism    Thyroid  disease     Past Surgical History:  Procedure Laterality Date   ABDOMINAL HYSTERECTOMY     COLON RESECTION N/A 05/04/2019   Procedure: LAPAROSCOPIC SIGMOID COLON RESECTION;  Surgeon: Jordis Laneta FALCON, MD;  Location: [facility] ORS;  Service: General;  Laterality: N/A;   COLONOSCOPY W/ POLYPECTOMY     COLONOSCOPY WITH PROPOFOL  N/A 07/07/2015   Procedure: COLONOSCOPY WITH PROPOFOL ;  Surgeon: Gladis MARLA Louder, MD;  Location: [facility] END[facility];  Service: Endoscopy;  Laterality: N/A;   COLONOSCOPY WITH PROPOFOL  N/A 04/06/2019   Procedure: COLONOSCOPY WITH PROPOFOL ;  Surgeon: Therisa Bi, MD;  Location: [facility] ENDOSCOPY;  Service: Gastroenterology;  Laterality: N/A;   GANGLION CYST EXCISION Right 12/2018   foot   ILEOSTOMY CLOSURE N/A 10/19/2019   Procedure: ILEOSTOMY TAKEDOWN;  Surgeon: Jordis Laneta FALCON, MD;  Location: [facility] ORS;  Service: General;   Laterality: N/A;    Family History  Problem Relation Age of Onset   Lung disease Father    Lung cancer Brother     Social History Social History[1]  Allergies[2]  Current Outpatient Medications  Medication Sig Dispense Refill   clotrimazole (MYCELEX) 10 MG troche Take 10 mg by mouth 5 (five) times daily.     cyclobenzaprine  (FLEXERIL ) 5 MG tablet Take 1 tablet (5 mg total) by mouth 3 (three) times daily as needed for muscle spasms. 30 tablet 0   DULoxetine  (CYMBALTA ) 60 MG capsule Take 1 capsule (60 mg total) by mouth daily. 30 capsule 11   EUTHYROX  175 MCG tablet Take 175 mcg by mouth daily before breakfast.      famotidine  (PEPCID ) 20 MG tablet Take 1 tablet (20 mg total) by mouth 2 (two) times daily. 30 tablet 3   ibuprofen (ADVIL) 200 MG tablet Take 400 mg by mouth 2 (two) times daily as needed for headache.     LORazepam  (ATIVAN ) 0.5 MG tablet Take 0.5 mg by mouth at bedtime as needed for anxiety or sleep.      meclizine (ANTIVERT) 25 MG tablet      omeprazole (PRILOSEC) 40 MG capsule Take 40 mg by mouth every evening.      OXcarbazepine  (TRILEPTAL ) 150 MG tablet Take 2 tablets (300 mg total) by mouth 3 (three) times daily. 180 tablet 11   pregabalin  (LYRICA ) 100 MG capsule Take 2 capsules (200 mg total) by mouth 3 (  three) times daily. 180 capsule 5   traMADol  (ULTRAM ) 50 MG tablet Take 50 mg by mouth every 6 (six) hours as needed for moderate pain.      valACYclovir (VALTREX) 1000 MG tablet Take 1 g by mouth 2 (two) times daily as needed (cold sores/fever blisters.).     No current facility-administered medications for this visit.     Review of Systems Full ROS  was asked and was negative except for the information on the HPI  Physical Exam Height 5' 6 (1.676 m). CONSTITUTIONAL: NAD. EYES: Pupils are equal, round, Sclera are non-icteric. EARS, NOSE, MOUTH AND THROAT: The oropharynx is clear. The oral mucosa is pink and moist. Hearing is intact to voice. LYMPH NODES:   Lymph nodes in the neck are normal. RESPIRATORY:  Lungs are clear. There is normal respiratory effort, with equal breath sounds bilaterally, and without pathologic use of accessory muscles. CARDIOVASCULAR: Heart is regular without murmurs, gallops, or rubs. GI: The abdomen is  soft, nontender, and nondistended. There are no palpable masses. There is no hepatosplenomegaly. There are normal bowel sounds in all quadrants. GU: Rectal deferred.   MUSCULOSKELETAL: Normal muscle strength and tone. No cyanosis or edema.   SKIN: Turgor is good and there are no pathologic skin lesions or ulcers. NEUROLOGIC: Motor and sensation is grossly normal. Cranial nerves are grossly intact. PSYCH:  Oriented to person, place and time. Affect is normal.  Data Reviewed I have personally reviewed the patient's imaging, laboratory findings and medical records.    Assessment/Plan Julie Arias is a very pleasant 74 year old female that is post sigmoid colectomy with diverting ileostomy 4 years ago.  She now presents with intermittent right sided abdominal pain and GI endoscopies suggested to have a surgical evaluation.  Will start with a CT scan of the abdomen pelvis to evaluate for any intra-abdominal pathology and we will also perform a contrast enema to evaluate our anastomosis and make sure it is patent She is still not in acute distress and does not require hospitalization or intervention at this time I personally spent a total of 45 minutes in the care of the patient today including performing a medically appropriate exam/evaluation, counseling and educating, placing orders, referring and communicating with other health care professionals, documenting clinical information in the EHR, independently interpreting and reviewing images studies and coordinating care.    Laneta Luna, MD FACS General Surgeon 09/05/2024, 9:25 AM       [1]  Social History Tobacco Use   Smoking status: Former    Current packs/day: 0.00     Average packs/day: 1 pack/day for 25.0 years (25.0 ttl pk-yrs)    Types: Cigarettes    Start date: 01/03/1979    Quit date: 01/03/2004    Years since quitting: 20.6   Smokeless tobacco: Never  Vaping Use   Vaping status: Never Used  Substance Use Topics   Alcohol use: No   Drug use: No  [2]  Allergies Allergen Reactions   Aleve [Naproxen] Other (See Comments)   Effexor Xr [Venlafaxine] Other (See Comments)   Aleve [Naproxen Sodium] Rash

## 2024-09-14 ENCOUNTER — Ambulatory Visit
